# Patient Record
Sex: Female | Born: 1965 | Race: Black or African American | Hispanic: No | Smoking: Never smoker
Health system: Southern US, Community
[De-identification: ages and names within clinical notes are randomized; demographics above are authoritative.]

## PROBLEM LIST (undated history)

## (undated) ENCOUNTER — Emergency Department (HOSPITAL_COMMUNITY): Admission: EM | Payer: Self-pay | Source: Home / Self Care

## (undated) DIAGNOSIS — I10 Essential (primary) hypertension: Secondary | ICD-10-CM

## (undated) DIAGNOSIS — T7840XA Allergy, unspecified, initial encounter: Secondary | ICD-10-CM

## (undated) DIAGNOSIS — R519 Headache, unspecified: Secondary | ICD-10-CM

## (undated) DIAGNOSIS — J45909 Unspecified asthma, uncomplicated: Secondary | ICD-10-CM

## (undated) DIAGNOSIS — Z8489 Family history of other specified conditions: Secondary | ICD-10-CM

## (undated) DIAGNOSIS — K5900 Constipation, unspecified: Secondary | ICD-10-CM

## (undated) DIAGNOSIS — G459 Transient cerebral ischemic attack, unspecified: Secondary | ICD-10-CM

## (undated) DIAGNOSIS — F32A Depression, unspecified: Secondary | ICD-10-CM

## (undated) DIAGNOSIS — F329 Major depressive disorder, single episode, unspecified: Secondary | ICD-10-CM

## (undated) DIAGNOSIS — K219 Gastro-esophageal reflux disease without esophagitis: Secondary | ICD-10-CM

## (undated) HISTORY — DX: Constipation, unspecified: K59.00

## (undated) HISTORY — DX: Essential (primary) hypertension: I10

## (undated) HISTORY — PX: ABDOMINAL HYSTERECTOMY: SHX81

## (undated) HISTORY — DX: Transient cerebral ischemic attack, unspecified: G45.9

## (undated) HISTORY — DX: Allergy, unspecified, initial encounter: T78.40XA

## (undated) HISTORY — DX: Depression, unspecified: F32.A

## (undated) HISTORY — DX: Major depressive disorder, single episode, unspecified: F32.9

## (undated) HISTORY — PX: FOOT SURGERY: SHX648

---

## 1998-03-18 ENCOUNTER — Emergency Department (HOSPITAL_COMMUNITY): Admission: EM | Admit: 1998-03-18 | Discharge: 1998-03-18 | Payer: Self-pay | Admitting: Emergency Medicine

## 1998-10-16 ENCOUNTER — Encounter: Admission: RE | Admit: 1998-10-16 | Discharge: 1999-01-14 | Payer: Self-pay | Admitting: Geriatric Medicine

## 1999-03-28 ENCOUNTER — Encounter: Payer: Self-pay | Admitting: *Deleted

## 1999-03-28 ENCOUNTER — Ambulatory Visit (HOSPITAL_COMMUNITY): Admission: RE | Admit: 1999-03-28 | Discharge: 1999-03-28 | Payer: Self-pay | Admitting: *Deleted

## 1999-06-27 ENCOUNTER — Inpatient Hospital Stay: Admission: AD | Admit: 1999-06-27 | Discharge: 1999-06-27 | Payer: Self-pay | Admitting: *Deleted

## 1999-08-01 ENCOUNTER — Inpatient Hospital Stay (HOSPITAL_COMMUNITY): Admission: AD | Admit: 1999-08-01 | Discharge: 1999-08-01 | Payer: Self-pay | Admitting: *Deleted

## 1999-08-24 ENCOUNTER — Inpatient Hospital Stay (HOSPITAL_COMMUNITY): Admission: AD | Admit: 1999-08-24 | Discharge: 1999-08-24 | Payer: Self-pay | Admitting: *Deleted

## 1999-09-08 ENCOUNTER — Inpatient Hospital Stay (HOSPITAL_COMMUNITY): Admission: AD | Admit: 1999-09-08 | Discharge: 1999-09-08 | Payer: Self-pay | Admitting: *Deleted

## 1999-09-12 ENCOUNTER — Inpatient Hospital Stay (HOSPITAL_COMMUNITY): Admission: AD | Admit: 1999-09-12 | Discharge: 1999-09-12 | Payer: Self-pay | Admitting: *Deleted

## 1999-09-20 ENCOUNTER — Inpatient Hospital Stay (HOSPITAL_COMMUNITY): Admission: AD | Admit: 1999-09-20 | Discharge: 1999-09-21 | Payer: Self-pay | Admitting: *Deleted

## 2000-01-09 ENCOUNTER — Ambulatory Visit (HOSPITAL_COMMUNITY): Admission: RE | Admit: 2000-01-09 | Discharge: 2000-01-09 | Payer: Self-pay | Admitting: Orthopedic Surgery

## 2000-01-09 ENCOUNTER — Encounter: Payer: Self-pay | Admitting: Orthopedic Surgery

## 2000-02-13 ENCOUNTER — Ambulatory Visit (HOSPITAL_COMMUNITY): Admission: RE | Admit: 2000-02-13 | Discharge: 2000-02-13 | Payer: Self-pay | Admitting: *Deleted

## 2000-10-24 ENCOUNTER — Emergency Department (HOSPITAL_COMMUNITY): Admission: EM | Admit: 2000-10-24 | Discharge: 2000-10-24 | Payer: Self-pay | Admitting: Emergency Medicine

## 2000-11-12 ENCOUNTER — Encounter: Admission: RE | Admit: 2000-11-12 | Discharge: 2001-02-10 | Payer: Self-pay | Admitting: Internal Medicine

## 2001-06-24 ENCOUNTER — Other Ambulatory Visit: Admission: RE | Admit: 2001-06-24 | Discharge: 2001-06-24 | Payer: Self-pay | Admitting: Obstetrics and Gynecology

## 2001-08-30 ENCOUNTER — Encounter: Admission: RE | Admit: 2001-08-30 | Discharge: 2001-11-28 | Payer: Self-pay | Admitting: Internal Medicine

## 2001-11-28 ENCOUNTER — Encounter: Admission: RE | Admit: 2001-11-28 | Discharge: 2002-02-26 | Payer: Self-pay | Admitting: Internal Medicine

## 2002-03-14 ENCOUNTER — Ambulatory Visit (HOSPITAL_COMMUNITY): Admission: RE | Admit: 2002-03-14 | Discharge: 2002-03-14 | Payer: Self-pay | Admitting: Obstetrics and Gynecology

## 2002-03-14 ENCOUNTER — Encounter (INDEPENDENT_AMBULATORY_CARE_PROVIDER_SITE_OTHER): Payer: Self-pay | Admitting: Specialist

## 2002-03-14 ENCOUNTER — Encounter (INDEPENDENT_AMBULATORY_CARE_PROVIDER_SITE_OTHER): Payer: Self-pay

## 2002-08-24 HISTORY — PX: TOTAL ABDOMINAL HYSTERECTOMY W/ BILATERAL SALPINGOOPHORECTOMY: SHX83

## 2003-01-17 ENCOUNTER — Other Ambulatory Visit: Admission: RE | Admit: 2003-01-17 | Discharge: 2003-01-17 | Payer: Self-pay | Admitting: Obstetrics and Gynecology

## 2003-02-02 ENCOUNTER — Encounter: Admission: RE | Admit: 2003-02-02 | Discharge: 2003-02-02 | Payer: Self-pay | Admitting: Obstetrics and Gynecology

## 2003-02-02 ENCOUNTER — Encounter: Payer: Self-pay | Admitting: Obstetrics and Gynecology

## 2003-02-09 ENCOUNTER — Encounter (INDEPENDENT_AMBULATORY_CARE_PROVIDER_SITE_OTHER): Payer: Self-pay | Admitting: Specialist

## 2003-02-09 ENCOUNTER — Ambulatory Visit (HOSPITAL_COMMUNITY): Admission: RE | Admit: 2003-02-09 | Discharge: 2003-02-09 | Payer: Self-pay | Admitting: Obstetrics and Gynecology

## 2003-05-24 ENCOUNTER — Encounter (INDEPENDENT_AMBULATORY_CARE_PROVIDER_SITE_OTHER): Payer: Self-pay

## 2003-05-24 ENCOUNTER — Observation Stay (HOSPITAL_COMMUNITY): Admission: RE | Admit: 2003-05-24 | Discharge: 2003-05-25 | Payer: Self-pay | Admitting: Obstetrics and Gynecology

## 2003-05-29 ENCOUNTER — Inpatient Hospital Stay (HOSPITAL_COMMUNITY): Admission: AD | Admit: 2003-05-29 | Discharge: 2003-05-29 | Payer: Self-pay | Admitting: *Deleted

## 2003-11-15 ENCOUNTER — Encounter (INDEPENDENT_AMBULATORY_CARE_PROVIDER_SITE_OTHER): Payer: Self-pay | Admitting: Specialist

## 2003-11-15 ENCOUNTER — Ambulatory Visit (HOSPITAL_COMMUNITY): Admission: RE | Admit: 2003-11-15 | Discharge: 2003-11-15 | Payer: Self-pay | Admitting: Obstetrics and Gynecology

## 2004-01-04 ENCOUNTER — Ambulatory Visit (HOSPITAL_COMMUNITY): Admission: RE | Admit: 2004-01-04 | Discharge: 2004-01-04 | Payer: Self-pay | Admitting: Urology

## 2004-01-04 ENCOUNTER — Ambulatory Visit (HOSPITAL_BASED_OUTPATIENT_CLINIC_OR_DEPARTMENT_OTHER): Admission: RE | Admit: 2004-01-04 | Discharge: 2004-01-04 | Payer: Self-pay | Admitting: Urology

## 2004-03-04 ENCOUNTER — Emergency Department (HOSPITAL_COMMUNITY): Admission: EM | Admit: 2004-03-04 | Discharge: 2004-03-04 | Payer: Self-pay | Admitting: Emergency Medicine

## 2004-10-03 ENCOUNTER — Emergency Department (HOSPITAL_COMMUNITY): Admission: EM | Admit: 2004-10-03 | Discharge: 2004-10-03 | Payer: Self-pay | Admitting: Family Medicine

## 2004-12-11 ENCOUNTER — Emergency Department (HOSPITAL_COMMUNITY): Admission: EM | Admit: 2004-12-11 | Discharge: 2004-12-11 | Payer: Self-pay | Admitting: Family Medicine

## 2004-12-21 ENCOUNTER — Emergency Department (HOSPITAL_COMMUNITY): Admission: EM | Admit: 2004-12-21 | Discharge: 2004-12-21 | Payer: Self-pay | Admitting: Emergency Medicine

## 2004-12-31 ENCOUNTER — Encounter: Admission: RE | Admit: 2004-12-31 | Discharge: 2004-12-31 | Payer: Self-pay | Admitting: Geriatric Medicine

## 2005-01-03 ENCOUNTER — Encounter: Admission: RE | Admit: 2005-01-03 | Discharge: 2005-01-03 | Payer: Self-pay | Admitting: Internal Medicine

## 2005-11-17 ENCOUNTER — Observation Stay (HOSPITAL_COMMUNITY): Admission: EM | Admit: 2005-11-17 | Discharge: 2005-11-18 | Payer: Self-pay | Admitting: Emergency Medicine

## 2006-02-06 ENCOUNTER — Emergency Department (HOSPITAL_COMMUNITY): Admission: EM | Admit: 2006-02-06 | Discharge: 2006-02-07 | Payer: Self-pay | Admitting: *Deleted

## 2006-04-12 ENCOUNTER — Encounter: Admission: RE | Admit: 2006-04-12 | Discharge: 2006-04-12 | Payer: Self-pay | Admitting: Internal Medicine

## 2006-04-22 ENCOUNTER — Encounter: Admission: RE | Admit: 2006-04-22 | Discharge: 2006-04-22 | Payer: Self-pay | Admitting: Gastroenterology

## 2006-07-27 ENCOUNTER — Encounter: Admission: RE | Admit: 2006-07-27 | Discharge: 2006-07-27 | Payer: Self-pay | Admitting: Sports Medicine

## 2007-03-17 ENCOUNTER — Encounter: Payer: Self-pay | Admitting: Family Medicine

## 2009-01-26 ENCOUNTER — Emergency Department (HOSPITAL_COMMUNITY): Admission: EM | Admit: 2009-01-26 | Discharge: 2009-01-26 | Payer: Self-pay | Admitting: Emergency Medicine

## 2009-11-20 ENCOUNTER — Encounter: Payer: Self-pay | Admitting: *Deleted

## 2009-11-20 ENCOUNTER — Ambulatory Visit: Payer: Self-pay | Admitting: Family Medicine

## 2009-11-20 DIAGNOSIS — F341 Dysthymic disorder: Secondary | ICD-10-CM | POA: Insufficient documentation

## 2009-11-20 DIAGNOSIS — I10 Essential (primary) hypertension: Secondary | ICD-10-CM | POA: Insufficient documentation

## 2009-11-20 DIAGNOSIS — E669 Obesity, unspecified: Secondary | ICD-10-CM | POA: Insufficient documentation

## 2009-11-20 DIAGNOSIS — E119 Type 2 diabetes mellitus without complications: Secondary | ICD-10-CM | POA: Insufficient documentation

## 2009-11-20 DIAGNOSIS — M549 Dorsalgia, unspecified: Secondary | ICD-10-CM | POA: Insufficient documentation

## 2009-11-22 ENCOUNTER — Encounter: Payer: Self-pay | Admitting: Family Medicine

## 2009-11-26 ENCOUNTER — Telehealth: Payer: Self-pay | Admitting: Family Medicine

## 2009-12-03 ENCOUNTER — Ambulatory Visit (HOSPITAL_COMMUNITY): Admission: RE | Admit: 2009-12-03 | Discharge: 2009-12-03 | Payer: Self-pay | Admitting: Family Medicine

## 2009-12-03 ENCOUNTER — Encounter: Payer: Self-pay | Admitting: Family Medicine

## 2009-12-09 ENCOUNTER — Ambulatory Visit: Payer: Self-pay | Admitting: Family Medicine

## 2009-12-09 ENCOUNTER — Encounter: Payer: Self-pay | Admitting: Family Medicine

## 2009-12-09 LAB — CONVERTED CEMR LAB
ALT: 17 units/L (ref 0–35)
AST: 16 units/L (ref 0–37)
Albumin: 4.3 g/dL (ref 3.5–5.2)
Alkaline Phosphatase: 98 units/L (ref 39–117)
BUN: 16 mg/dL (ref 6–23)
Calcium: 9.6 mg/dL (ref 8.4–10.5)
Cholesterol: 177 mg/dL (ref 0–200)
Glucose, Bld: 105 mg/dL — ABNORMAL HIGH (ref 70–99)
Sodium: 140 meq/L (ref 135–145)
Total Bilirubin: 0.3 mg/dL (ref 0.3–1.2)
Total Protein: 7.1 g/dL (ref 6.0–8.3)
Triglycerides: 155 mg/dL — ABNORMAL HIGH (ref <150)

## 2009-12-11 ENCOUNTER — Encounter: Payer: Self-pay | Admitting: Family Medicine

## 2009-12-12 ENCOUNTER — Encounter: Payer: Self-pay | Admitting: Family Medicine

## 2009-12-20 ENCOUNTER — Encounter: Payer: Self-pay | Admitting: Family Medicine

## 2009-12-21 ENCOUNTER — Encounter: Payer: Self-pay | Admitting: Emergency Medicine

## 2009-12-22 ENCOUNTER — Encounter: Payer: Self-pay | Admitting: Family Medicine

## 2009-12-22 ENCOUNTER — Ambulatory Visit: Payer: Self-pay | Admitting: Family Medicine

## 2009-12-22 ENCOUNTER — Observation Stay (HOSPITAL_COMMUNITY): Admission: EM | Admit: 2009-12-22 | Discharge: 2009-12-23 | Payer: Self-pay | Admitting: Family Medicine

## 2009-12-22 DIAGNOSIS — R55 Syncope and collapse: Secondary | ICD-10-CM | POA: Insufficient documentation

## 2009-12-22 DIAGNOSIS — R42 Dizziness and giddiness: Secondary | ICD-10-CM | POA: Insufficient documentation

## 2009-12-27 ENCOUNTER — Ambulatory Visit: Payer: Self-pay | Admitting: Family Medicine

## 2010-01-02 ENCOUNTER — Telehealth (INDEPENDENT_AMBULATORY_CARE_PROVIDER_SITE_OTHER): Payer: Self-pay | Admitting: *Deleted

## 2010-01-02 DIAGNOSIS — E559 Vitamin D deficiency, unspecified: Secondary | ICD-10-CM | POA: Insufficient documentation

## 2010-01-08 ENCOUNTER — Ambulatory Visit: Payer: Self-pay | Admitting: Family Medicine

## 2010-02-05 ENCOUNTER — Encounter: Payer: Self-pay | Admitting: *Deleted

## 2010-07-07 ENCOUNTER — Ambulatory Visit: Payer: Self-pay | Admitting: Family Medicine

## 2010-07-07 ENCOUNTER — Encounter: Payer: Self-pay | Admitting: Sports Medicine

## 2010-07-07 ENCOUNTER — Encounter: Payer: Self-pay | Admitting: Family Medicine

## 2010-07-07 ENCOUNTER — Telehealth (INDEPENDENT_AMBULATORY_CARE_PROVIDER_SITE_OTHER): Payer: Self-pay | Admitting: *Deleted

## 2010-09-23 NOTE — Miscellaneous (Signed)
Summary: Eye Exam  Clinical Lists Changes  Observations: Added new observation of PAST MED HX: Diabetes (2006) HTN Depression Anxiety Xray 11/2007 Rt hip: osteoarthritic changes, spurring along superior lateral aspect of acetabulum Eye exam 11/2009: No diabetic retinopathy, by Wynona Luna, OD. [phone] (12/20/2009 11:00) Added new observation of DMEYEEXAMNXT: 12/2010 (12/20/2009 11:00) Added new observation of DIAB EYE EX: No diabetic retinopathy.   Visual acuity OD (best corrected):     20/20 Visual acuity OS (best corrected):     20/25 Intraocular pressure OD:     20 Intraocular pressure OS:     18  By Wynona Luna, OD.  [phone]  (12/11/2009 11:03)      Diabetic Eye Exam  Procedure date:  12/11/2009  Findings:      No diabetic retinopathy.   Visual acuity OD (best corrected):     20/20 Visual acuity OS (best corrected):     20/25 Intraocular pressure OD:     20 Intraocular pressure OS:     18  By Wynona Luna, OD.  [phone]   Procedures Next Due Date:    Diabetic Eye Exam: 12/2010   Past History:  Past Medical History: Diabetes (2006) HTN Depression Anxiety Xray 11/2007 Rt hip: osteoarthritic changes, spurring along superior lateral aspect of acetabulum Eye exam 11/2009: No diabetic retinopathy, by Wynona Luna, OD. [phone]

## 2010-09-23 NOTE — Miscellaneous (Signed)
Summary: Records from [facility]  Clinical Lists Changes Meds:   Protonix 40mg  daily Diovan 80mg  daily Byeta 5mg  two times a day Lexapro 10mg  daily Ibuprofen 800mg  three times a day Glucophage 500mg  two times a day Lorazepam 0.5mg  two times a day Albuterol as needed  On 06/05/09 pt was referred to Psychiatry at [facility]: Dx Situational reaction to breaking-and-entering  03/15/2007: Pt was seen by Dr Helmut Muster  (Rhe[place]) for body pain.  DDx at that time were Connective tissue disease, Infection (parvovirus or hepatitis), Spondylarthropathy, Isolated  elevated CPK, Arthritis pain.  He recommended Tylenol / Ibuprofen, Oxycodone    Observations: Added new observation of PAST MED HX: Diabetes (2006) HTN Depression Anxiety Xray 11/2007 Rt hip: osteoarthritic changes, spurring along superior lateral aspect of acetabulum (12/03/2009 13:15) Added new observation of PAST SURG HX: -Foot surgery x 3, 2008 Dr Lajoyce Corners [facility] Ortho): excision of the os peronei right foot with tenodesis of the peroneus bevis and peroneus longus -12/08/2005  Stress Test at [facility] Card:  NO ischemia.  Normal L ventricular contraction with ED 68%.   -TAH (fibroids) 2004  (12/03/2009 13:15) Added new observation of PRIMARY MD: Mileena Rothenberger MD (12/03/2009 13:15) Added new observation of BONE DENSITY: Done at [facility] (03/17/2007 8:43) Added new observation of BONE DENSITY: Lumbar Spine:  T Score > -1.0 Spine.  Hip Total: T Score > -1.0 Hip.   (03/17/2007 8:42)     Primary lesion:  PCP:   Past History:  Past Medical History: Diabetes (2006) HTN Depression Anxiety Xray 11/2007 Rt hip: osteoarthritic changes, spurring along superior lateral aspect of acetabulum  Past Surgical History: -Foot surgery x 3, 2008 Dr Lajoyce Corners [facility] Ortho): excision of the os peronei right foot with tenodesis of the peroneus bevis and peroneus longus -12/08/2005  Stress Test at [facility] Card:   NO ischemia.  Normal L ventricular contraction with ED 68%.   -TAH (fibroids) 2004   Bone Density  Procedure date:  03/17/2007  Findings:      Lumbar Spine:  T Score > -1.0 Spine.  Hip Total: T Score > -1.0 Hip.    Bone Density  Procedure date:  03/17/2007  Findings:      Done at [facility]

## 2010-09-23 NOTE — Progress Notes (Signed)
Summary: Rx Prob  Phone Note Call from Patient Call back at [facility] Phone [phone]   Caller: Patient Summary of Call: When she went to pick up her rx for her Vitman D & her Prodigy Meter she could not get it due to the pharmacy not having the right dx code to file for medicaid.  Pharmacy is [facility] Initial call taken by: Clydell Hakim,  Jan 02, 2010 10:28 AM  New Problems: VITAMIN D DEFICIENCY (ICD-268.9)   New Problems: VITAMIN D DEFICIENCY (ICD-268.9)  patient notified that diagnosis codes have been faxed to pharmacy. Theresia Lo RN  Jan 02, 2010 11:42 AM

## 2010-09-23 NOTE — Miscellaneous (Signed)
Summary: NPI for eye exam  Clinical Lists Changes gave our NPI for Dr. Kennon Rounds [phone] do a diabetic eye exam..Kiora Hallberg [facility] RN  February 05, 2010 11:04 AM

## 2010-09-23 NOTE — Miscellaneous (Signed)
Summary: ROI/TS  Clinical Lists Changes ROI FAXED TO DR. POLITE ([place]).Arlyss Repress CMA,  November 20, 2009 4:21 PM

## 2010-09-23 NOTE — Letter (Signed)
Summary: Handout Printed  Printed Handout:  - Educational psychologist

## 2010-09-23 NOTE — Miscellaneous (Signed)
Summary: Procedures Consent  Procedures Consent   Imported By: De Nurse 07/11/2010 16:53:15  _____________________________________________________________________  External Attachment:    Type:   Image     Comment:   External Document

## 2010-09-23 NOTE — Initial Assessments (Signed)
Vital Signs:  Patient profile:   45 year old female O2 Sat:      96 % on Room air Temp:     99.4 degrees F Pulse rate:   88 / minute Pulse rhythm:   regular Resp:     18 per minute BP supine:   114 / 77  O2 Flow:  Room air  Primary Care Provider:  Cat Ta MD  CC:  Dizziness and passing out.  History of Present Illness: HPI: Pt is a 45 yo F who presented to [facility] with dizziness and two episodes of syncope.  Pt with a PMHx significant for depression and hypertension.  She was recently started on Lisinopril about 4 weeks ago.  For 2 weeks now she has been dizzy.  The dizziness is worse when she gets up.  She fell twice last week when she stood up and "everything went black".  She was driving today and turned suddenly and got very dizzy.  She was at the pharacy today after that happened and her blood pressure was 60/30 so she came to the ED.  ROS: Endorses feeling like her throat is going to close up.  Denies headache, numbness / weakness, chest pain, shortness of breath, headache, vision changes, dysuria, excessive thirst   Current Problems (verified): 1)  Back Pain  (ICD-724.5) 2)  Obesity  (ICD-278.00) 3)  Anxiety Depression  (ICD-300.4) 4)  Hypertension, Essential  (ICD-401.9) 5)  Dm  (ICD-250.00)  Current Medications (verified): 1)  Glucophage 500 Mg Tabs (Metformin Hcl) .Marland Kitchen.. 1 Tab By Mouth Two Times A Day For Diabetes 2)  Fluoxetine Hcl 10 Mg Caps (Fluoxetine Hcl) .Marland Kitchen.. 1 Capsule By Mouth Daily 3)  Ibuprofen 800 Mg Tabs (Ibuprofen) .Marland Kitchen.. 1 Tab By Mouth Every 8 Hours As Needed Back Pain and Foot Pain 4)  Lisinopril-Hydrochlorothiazide 20-12.5 Mg Tabs (Lisinopril-Hydrochlorothiazide) .Marland Kitchen.. 1 Tab By Mouth Daily For Blood Pressure 5)  Byetta 5 Mcg Pen 5 Mcg/0.22ml Soln (Exenatide) .... Inject Subcutaneously Two Times A Day For Diabetes. Dispense For 1 Month 6)  Bd Pen Needle Short U/f 31g X 8 Mm Misc (Insulin Pen Needle) .... Dispense Quanity Sufficient For Twice A Day Injections 7)   Vitamin D (Ergocalciferol) 50000 Unit Caps (Ergocalciferol) .Marland Kitchen.. 1 Cap By Mouth Each Week For 8 Weeks.  Allergies (verified): No Known Drug Allergies  Past History:  Past Medical History: Reviewed history from 12/20/2009 and no changes required. Diabetes (2006) HTN Depression Anxiety Xray 11/2007 Rt hip: osteoarthritic changes, spurring along superior lateral aspect of acetabulum Eye exam 11/2009: No diabetic retinopathy, by Wynona Luna, OD. [phone]  Past Surgical History: Reviewed history from 12/03/2009 and no changes required. -Foot surgery x 3, 2008 Dr Lajoyce Corners [facility] Ortho): excision of the os peronei right foot with tenodesis of the peroneus bevis and peroneus longus -12/08/2005  Stress Test at [facility] Card:  NO ischemia.  Normal L ventricular contraction with ED 68%.   -TAH (fibroids) 2004  Family History: Reviewed history from 11/20/2009 and no changes required. Father: unknown Mother: age 69, HTN, DM No CAD, No Cancer  Social History: Reviewed history from 11/20/2009 and no changes required. 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  Quit all ethol 2008.  Never Tobacco Never Drugs  Physical Exam  General:  Awake, alert, no acute distress Head:  normocephalic and  atraumatic.   Eyes:  PERRL, EOMI Neck:  no JVD, no carotid bruits Lungs:  Normal respiratory effort, chest expands symmetrically. Lungs are clear to auscultation, no crackles or wheezes. Heart:  normal rate, regular rhythm, and no murmur.   Abdomen:  soft, non-tender, normal bowel sounds, and no distention.   Extremities:  no lower extremity edema Neurologic:  alert & oriented X3, cranial nerves II-XII intact, strength normal in all extremities, and sensation intact to light touch.   Skin:  turgor normal and color normal.   Psych:  Oriented X3,  memory intact for recent and remote, and normally interactive.   Additional Exam:  1. Head CT: normal 2. CXR: normal 3. CBG: 106 4. CBC: 10.4>12.4<409 5. INR 0.97 6. UA: neg nitrite, trace LE 7. UMicro: few epi, 3-6 WBCs, few bacteria 8. POC CEs neg 9. CEs neg x 1    Impression & Recommendations:  Problem # 1:  SYNCOPE (ICD-780.2) Assessment New This is most likely due to hypotension since her blood pressure was low at the pharmacy and in the ED.  Will check orthostatics.  Do not think that this is caused by something serious.  Head CT negative for stroke.  Will rule out MI with CEs.  Will monitor on telemetry for arrhythmia.  Will check TSH.    Problem # 2:  DIZZINESS (ICD-780.4) Assessment: New Most likely 2/2 hypotension.  Will hold Lisinopril.    Problem # 3:  ANXIETY DEPRESSION (ICD-300.4) Assessment: Unchanged Hold Prozac for now  Problem # 4:  DM (ICD-250.00) SSI Her updated medication list for this problem includes:    Glucophage 500 Mg Tabs (Metformin hcl) .Marland Kitchen... 1 tab by mouth two times a day for diabetes    Lisinopril-hydrochlorothiazide 20-12.5 Mg Tabs (Lisinopril-hydrochlorothiazide) .Marland Kitchen... 1 tab by mouth daily for blood pressure    Byetta 5 Mcg Pen 5 Mcg/0.65ml Soln (Exenatide) ..... Inject subcutaneously two times a day for diabetes. dispense for 1 month FEN/GI: Heart Healthy Carb-modified diet.  NS @ 150cc/hr  PPx: Heparin  Dispo: Observation status, pending negative workup  Complete Medication List: 1)  Glucophage 500 Mg Tabs (Metformin hcl) .Marland Kitchen.. 1 tab by mouth two times a day for diabetes 2)  Fluoxetine Hcl 10 Mg Caps (Fluoxetine hcl) .Marland Kitchen.. 1 capsule by mouth daily 3)  Ibuprofen 800 Mg Tabs (Ibuprofen) .Marland Kitchen.. 1 tab by mouth every 8 hours as needed back pain and foot pain 4)  Lisinopril-hydrochlorothiazide 20-12.5 Mg Tabs (Lisinopril-hydrochlorothiazide) .Marland Kitchen.. 1 tab by mouth daily for blood pressure 5)  Byetta 5 Mcg Pen 5 Mcg/0.57ml Soln (Exenatide) .... Inject  subcutaneously two times a day for diabetes. dispense for 1 month 6)  Bd Pen Needle Short U/f 31g X 8 Mm Misc (Insulin pen needle) .... Dispense quanity sufficient for twice a day injections 7)  Vitamin D (ergocalciferol) 50000 Unit Caps (Ergocalciferol) .Marland Kitchen.. 1 cap by mouth each week for 8 weeks.

## 2010-09-23 NOTE — Miscellaneous (Signed)
Summary: Pt's list of home meds  Clinical Lists Changes Pt dropped off list that showed she was taking Alprazolam 0.5mg   Fluoxetine 10mg  daily Lorazepam 0.5mg  Zoldipem 10mg  Byetta 5mg   I will Rx byetta, fluoxetine, ibuprofen but not the rest.  She has been off of meds for quite a few months and I did not discuss other meds with her.  Will need to see me in clinic for other meds.  Medications: Changed medication from FLUOXETINE HCL 40 MG CAPS (FLUOXETINE HCL) 1 tab by mouth daily to FLUOXETINE HCL 10 MG CAPS (FLUOXETINE HCL) 1 capsule by mouth daily - Signed Added new medication of BYETTA 5 MCG PEN 5 MCG/0.02ML SOLN (EXENATIDE) Inject subcutaneously two times a day for diabetes. Dispense for 1 month - Signed Rx of FLUOXETINE HCL 10 MG CAPS (FLUOXETINE HCL) 1 capsule by mouth daily;  #30 x 6;  Signed;  Entered by: Angeline Slim MD;  Authorized by: Angeline Slim MD;  Method used: Electronically to [facility] [address], [place], [place], [place]  [address], Ph: [phone], Fax: [phone] Rx of BYETTA 5 MCG PEN 5 MCG/0.02ML SOLN (EXENATIDE) Inject subcutaneously two times a day for diabetes. Dispense for 1 month;  #1 x 6;  Signed;  Entered by: Angeline Slim MD;  Authorized by: Angeline Slim MD;  Method used: Electronically to [facility] [address], [place], [place], [place]  [address], Ph: [phone], Fax: [phone]    Prescriptions: BYETTA 5 MCG PEN 5 MCG/0.02ML SOLN (EXENATIDE) Inject subcutaneously two times a day for diabetes. Dispense for 1 month  #1 x 6   Entered and Authorized by:   Angeline Slim MD   Signed by:   Angeline Slim MD on 11/22/2009   Method used:   Electronically to        [facility] (retail)       [address]       [place], [place]  [address]       Ph: [phone]       Fax: [phone]   RxID:   [id number] FLUOXETINE HCL 10 MG CAPS (FLUOXETINE HCL) 1 capsule by mouth daily  #30 x 6   Entered and Authorized by:   Angeline Slim  MD   Signed by:   Angeline Slim MD on 11/22/2009   Method used:   Electronically to        [facility] (retail)       [address]       [place], [place]  [address]       Ph: [phone]       Fax: [phone]   RxID:   [id number]

## 2010-09-23 NOTE — Progress Notes (Signed)
Summary: pt threw her back out would like to be seen   Phone Note Call from Patient Call back at [phone]   Caller: Patient Call For: traige Summary of Call: lifting some boxes her back was thrown out would like to be seen today. she is in a lot of pain Initial call taken by: Loralee Pacas CMA,  July 07, 2010 9:50 AM  Follow-up for Phone Call        Returned call, no answer.   Follow-up by: Dennison Nancy RN,  July 07, 2010 10:05 AM  Additional Follow-up for Phone Call Additional follow up Details #1::        Still not answering.  If she calls back please schedule her with Dr. Karie Schwalbe in his last SDA spot. Additional Follow-up by: Dennison Nancy RN,  July 07, 2010 10:23 AM    Additional Follow-up for Phone Call Additional follow up Details #2::    appt sched for 11:15 Follow-up by: De Nurse,  July 07, 2010 10:29 AM

## 2010-09-23 NOTE — Consult Note (Signed)
Summary: Optometry: No diabetic Retinopathy  Ophthalmology   Imported By: Clydell Hakim 12/16/2009 15:50:33  _____________________________________________________________________  External Attachment:    Type:   Image     Comment:   External Document

## 2010-09-23 NOTE — Assessment & Plan Note (Signed)
Summary: bp ck/tcb  Nurse Visit   Vital Signs:  Patient profile:   45 year old female BP sitting:   129 / 86  (left arm)  Vitals Entered By: Loralee Pacas CMA (Jan 08, 2010 9:22 AM)  Allergies: No Known Drug Allergies  Orders Added: 1)  Est Level 1- [facility] [[address]]

## 2010-09-23 NOTE — Assessment & Plan Note (Signed)
Summary: FU/KH   Vital Signs:  Patient profile:   45 year old female Weight:      210 pounds BMI:     33.01 Temp:     98.8 degrees F oral Pulse rate:   83 / minute BP sitting:   118 / 89  (left arm)  Vitals Entered By: Arlyss Repress CMA, (Dec 27, 2009 4:49 PM) CC: hospital f/up   Primary Care Provider:  Angeline Slim MD  CC:  hospital f/up.  History of Present Illness: 45 y/o F was recently admitted for syncope most likely due to hypotension.  Antihypertensive and Byetta were discontinued at that time.  During hospitalizations CBGs were in the 100-120s range.  This is hosp f/u visit.  She has been taking Byetta and Lisinopril20/HCTZ 12.5 for years without complications.  States that she recently had a lot of stressor.  Denied taking too much meds.  Denied skipping meals.    Stressors:   1.  Pt was on her way here when the tire on her car broke.  Noone stopped to help her change it.  So she was late getting her.    2.  55 y/o son was recently expelled from [employer].  The only school he can attend now is [employer].  Patient is now responsible for taking son to new school.  She is worried about her ability to do that.  Her son's father did not offer her any assitance when she told him the news.  He did not offer to have her son live with hiim.  3.  Ex-husband remarried on 4/25 to someone he has only known for 2 months.  Pt is living in his house.  She has until the end of June to move out.  She is now waiting for [employer] to assist her with housing.     Habits & Providers  Alcohol-Tobacco-Diet     Tobacco Status: never  Current Medications (verified): 1)  Glucophage 500 Mg Tabs (Metformin Hcl) .Marland Kitchen.. 1 Tab By Mouth Two Times A Day For Diabetes 2)  Fluoxetine Hcl 10 Mg Caps (Fluoxetine Hcl) .Marland Kitchen.. 1 Capsule By Mouth Daily 3)  Ibuprofen 800 Mg Tabs (Ibuprofen) .Marland Kitchen.. 1 Tab By Mouth Every 8 Hours As Needed Back Pain and Foot Pain 4)  Lisinopril-Hydrochlorothiazide 20-12.5 Mg Tabs  (Lisinopril-Hydrochlorothiazide) .Marland Kitchen.. 1 Tab By Mouth Daily For Blood Pressure 5)  Byetta 5 Mcg Pen 5 Mcg/0.56ml Soln (Exenatide) .... Inject Subcutaneously Two Times A Day For Diabetes. Dispense For 1 Month 6)  Bd Pen Needle Short U/f 31g X 8 Mm Misc (Insulin Pen Needle) .... Dispense Quanity Sufficient For Twice A Day Injections 7)  Vitamin D (Ergocalciferol) 50000 Unit Caps (Ergocalciferol) .Marland Kitchen.. 1 Cap By Mouth Each Week For 8 Weeks. 8)  Prodigy Blood Glucose Monitor W/device Kit (Blood Glucose Monitoring Suppl) .... Check Your Sugar Three Times A Day 9)  Prodigy Blood Glucose Test  Strp (Glucose Blood) .... Test Your Gluoces Three Times A Day  Allergies (verified): No Known Drug Allergies  Past History:  Past Medical History: Last updated: 12/20/2009 Diabetes (2006) HTN Depression Anxiety Xray 11/2007 Rt hip: osteoarthritic changes, spurring along superior lateral aspect of acetabulum Eye exam 11/2009: No diabetic retinopathy, by Wynona Luna, OD. [phone]  Past Surgical History: Last updated: 12/03/2009 -Foot surgery x 3, 2008 Dr Lajoyce Corners [facility] Ortho): excision of the os peronei right foot with tenodesis of the peroneus bevis and peroneus longus -12/08/2005  Stress Test at [facility] Card:  NO  ischemia.  Normal L ventricular contraction with ED 68%.   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  Quit all ethol 2008.  Never Tobacco Never Drugs  Risk Factors: Alcohol Use: 0 (11/20/2009) Exercise: no (11/20/2009)  Risk Factors: Smoking Status: never (12/27/2009)  Social History: Lives with 2 sons  Pershing Proud 16, Darron Psychologist, occupational 11) and adopted daughter  Rica Mast 5) No pets Unemployed [employer] graduate Divorced Renting  Used to drink 3-4 cases of beer per week.  Quit all ethol 2008.  Never Tobacco Never Drugs  Review of Systems CV:  Denies chest pain or discomfort, difficulty breathing at night, difficulty breathing while lying down, fainting, fatigue, lightheadness, near fainting, and palpitations. Resp:  Denies chest discomfort, chest pain with inspiration, cough, and wheezing. Endo:  Denies cold intolerance, excessive hunger, excessive thirst, excessive urination, heat intolerance, and polyuria.  Physical Exam  General:  Well-developed,well-nourished,in no acute distress; alert,appropriate and cooperative throughout examination. vitals reviewed Lungs:  Normal respiratory effort, chest expands symmetrically. Lungs are clear to auscultation, no crackles or wheezes. Heart:  Normal rate and regular rhythm. S1 and S2 normal without gallop, murmur, click, rub or other extra sounds. Abdomen:  Bowel sounds positive,abdomen soft and non-tender without masses, organomegaly or hernias noted.   Impression & Recommendations:  Problem # 1:  SYNCOPE (ICD-780.2) Assessment Improved  No syncopal episodes since discharge.  At this time BP is at goal.  Will continue to hold off on antihypertensive for now.  Pt to return to clinic in next 1-2 wks for BP check.  She is to check CBGs three times a day and if >200 consistently she is to resume Byetta.  Pt to rtc in 4 wks to see me.    Orders: [facility]- Est Level  3 ([address])  Complete Medication List: 1)  Glucophage 500 Mg Tabs (Metformin hcl) .Marland Kitchen.. 1 tab by mouth two times a day for diabetes 2)  Fluoxetine Hcl 10 Mg Caps (Fluoxetine hcl) .Marland Kitchen.. 1 capsule by mouth daily 3)  Ibuprofen 800 Mg Tabs (Ibuprofen) .Marland Kitchen.. 1 tab by mouth every 8 hours as needed back pain and foot pain 4)  Lisinopril-hydrochlorothiazide 20-12.5 Mg Tabs (Lisinopril-hydrochlorothiazide) .Marland Kitchen.. 1 tab by mouth daily for blood  pressure 5)  Byetta 5 Mcg Pen 5 Mcg/0.7ml Soln (Exenatide) .... Inject subcutaneously two times a day for diabetes. dispense for 1 month 6)  Bd Pen Needle Short U/f 31g X 8 Mm Misc (Insulin pen needle) .... Dispense quanity sufficient for twice a day injections 7)  Vitamin D (ergocalciferol) 50000 Unit Caps (Ergocalciferol) .Marland Kitchen.. 1 cap by mouth each week for 8 weeks. 8)  Prodigy Blood Glucose Monitor W/device Kit (Blood glucose monitoring suppl) .... Check your sugar three times a day 9)  Prodigy Blood Glucose Test Strp (Glucose blood) .... Test your gluoces three times a day  Patient Instructions: 1)  Please schedule a follow-up appointment in 1-2 weeks for nurse visit to check your blood pressure x 2.  2)  Please schedule a follow-up appointment in 4 weeks with Dr Janalyn Harder 3)  Check your sugar three times per day and bring in the log in when you see me.  If  you see numbers > 200, start your Byetta again.  I don't want your numbers to get so high that you may start to feel bad again.   Prescriptions: PRODIGY BLOOD GLUCOSE TEST  STRP (GLUCOSE BLOOD) Test your gluoces three times a day  #99 x 99   Entered and Authorized by:   Angeline Slim MD   Signed by:   Angeline Slim MD on 12/27/2009   Method used:   Electronically to        [facility] (retail)       [address]       [place], [place]  [address]       Ph: [phone]       Fax: [phone]   RxID:   [id number] PRODIGY BLOOD GLUCOSE MONITOR W/DEVICE KIT (BLOOD GLUCOSE MONITORING SUPPL) Check your sugar three times a day  #1 x 0   Entered and Authorized by:   Angeline Slim MD   Signed by:   Angeline Slim MD on 12/27/2009   Method used:   Electronically to        [facility] (retail)       [address]       [place], [place]  [address]       Ph: [phone]       Fax: [phone]   RxID:   [id number]

## 2010-09-23 NOTE — Letter (Signed)
Summary: Lipid Letter, lab results  [facility]  [address]   [place], [place] [address]   Phone: [phone]  Fax: [phone]    12/11/2009  Isabel Berger [address] [name], [place]  [address]  Dear Isabel Berger:  We have carefully reviewed your last lipid profile from 12/09/2009 and the results are noted below with a summary of recommendations for lipid management.    Cholesterol:       177     Goal: < 200   HDL "good" Cholesterol:   37     Goal: > 45   LDL "bad" Cholesterol:   109     Goal: < 130   Triglycerides:       155     Goal: < 150        TLC Diet (Therapeutic Lifestyle Change): Saturated Fats & Transfatty acids should be kept < 7% of total calories ***Reduce Saturated Fats Polyunstaurated Fat can be up to 10% of total calories Monounsaturated Fat Fat can be up to 20% of total calories Total Fat should be no greater than 25-35% of total calories Carbohydrates should be 50-60% of total calories Protein should be approximately 15% of total calories Fiber should be at least 20-30 grams a day ***Increased fiber may help lower LDL Total Cholesterol should be < 200mg /day Consider adding plant stanol/sterols to diet (example: Benacol spread) ***A higher intake of unsaturated fat may reduce Triglycerides and Increase HDL    Adjunctive Measures (may lower LIPIDS and reduce risk of Heart Attack) include: Aerobic Exercise (20-30 minutes 3-4 times a week) Limit Alcohol Consumption Weight Reduction Aspirin 75-81 mg a day by mouth (if not allergic or contraindicated) Dietary Fiber 20-30 grams a day by mouth   Hemoglobin:  No anemia  Kidneys:  Normal  Electrolytes:  Normal  Liver:  Normal  Thyroid:  Normal    Vitamin D: very low.  I would like you to take Vitamin D 50,000 units once a week for 8 weeks, then take over-the-counter daily Vitamin D supplement.  I will send a prescription to your pharmacy.    Glucose (sugar): mildly elevated.   I would like you to come to clinic to check your blood for diabetes.  You can call and make an appointment with lab for this test, or you can wait until you see me in May and we can check it then.    If you have any questions, please call. We appreciate being able to work with you.   Sincerely,    [facility] Lillyona Polasek MD  Appended Document: Lipid Letter, lab results mailed.

## 2010-09-23 NOTE — Miscellaneous (Signed)
Summary: Vit D Rx  Clinical Lists Changes  Medications: Added new medication of VITAMIN D (ERGOCALCIFEROL) 50000 UNIT CAPS (ERGOCALCIFEROL) 1 cap by mouth each week for 8 weeks. - Signed Rx of VITAMIN D (ERGOCALCIFEROL) 50000 UNIT CAPS (ERGOCALCIFEROL) 1 cap by mouth each week for 8 weeks.;  #8 x 0;  Signed;  Entered by: Angeline Slim MD;  Authorized by: Angeline Slim MD;  Method used: Electronically to [facility] [address] [place], [place], [place]  [address], Ph: [phone], Fax: [phone]    Prescriptions: VITAMIN D (ERGOCALCIFEROL) 50000 UNIT CAPS (ERGOCALCIFEROL) 1 cap by mouth each week for 8 weeks.  #8 x 0   Entered and Authorized by:   Angeline Slim MD   Signed by:   Angeline Slim MD on 12/11/2009   Method used:   Electronically to        [facility] (retail)       [address]       [place], [place]  [address]       Ph: [phone]       Fax: [phone]   RxID:   [id number]

## 2010-09-23 NOTE — Progress Notes (Signed)
Summary: RX  Phone Note Refill Request Call back at Home Phone [phone]   pt sts she got the RX Byetta but not the pens, she needs the  BD ultrafine 3 short pen needles, 8 mm, [facility] to [facility]  Initial call taken by: Knox Royalty,  November 26, 2009 2:17 PM  Follow-up for Phone Call        Pt. notified Rx for needles was sent to [facility]. Follow-up by: Terese Door,  November 27, 2009 2:32 PM    New/Updated Medications: BD PEN NEEDLE SHORT U/F 31G X 8 MM MISC (INSULIN PEN NEEDLE) dispense quanity sufficient for twice a day injections Prescriptions: BD PEN NEEDLE SHORT U/F 31G X 8 MM MISC (INSULIN PEN NEEDLE) dispense quanity sufficient for twice a day injections  #1 x 11   Entered by:   Terese Door   Authorized by:   Angeline Slim MD   Signed by:   Terese Door on 11/27/2009   Method used:   Electronically to        [facility] (retail)       [address]       [place], [place]  [address]       Ph: [phone]       Fax: [phone]   RxID:   [id number]  Please verify needle size.  I do not understand the size pt left on message.  Pt should read from the box she has at home. Wassim Kirksey MD  November 26, 2009 11:13 PM  Spoke to pt Re referral to Dr Lajoyce Corners.  We are waiting for records from previous PCP because when I saw pt last she was unsure as to why she needs to see Dr Lajoyce Corners.  Pt understands.  Heavyn Yearsley MD  November 27, 2009 2:38 PM
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
## 2010-09-23 NOTE — Assessment & Plan Note (Signed)
Summary: back pain,df   Vital Signs:  Patient profile:   45 year old female Height:      67 inches Weight:      229 pounds BMI:     36.00 Temp:     98.2 degrees F oral Pulse rate:   90 / minute BP sitting:   125 / 83  (left arm) Cuff size:   large  Vitals Entered By: Tessie Fass CMA (July 07, 2010 11:22 AM) CC: lower back pain x 1 week Is Patient Diabetic? Yes Pain Assessment Patient in pain? yes     Location: lower back Intensity: 10   Primary Care Provider:  Cat Ta MD  CC:  lower back pain x 1 week.  History of Present Illness: 45 yo female with hx chronic LBP here with acute exacerbation.  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  Over the next few days she began having severe LBP mostly on the right side just above buttock without radiation.  Pain is sharp, worse with moving particularly with spine extension.  No bowel/bladder problems, no saddle anesthesia.  She has been taking approx 1600 mg of ibuprofen total per day.  This helps a little.  Pain is stable.  Pain is 10/10.  Habits & Providers  Alcohol-Tobacco-Diet     Tobacco Status: never  Current Medications (verified): 1)  Glucophage 500 Mg Tabs (Metformin Hcl) .Marland Kitchen.. 1 Tab By Mouth Two Times A Day For Diabetes 2)  Fluoxetine Hcl 10 Mg Caps (Fluoxetine Hcl) .Marland Kitchen.. 1 Capsule By Mouth Daily 3)  Ibuprofen 800 Mg Tabs (Ibuprofen) .Marland Kitchen.. 1 Tab By Mouth Every 8 Hours As Needed Back Pain and Foot Pain 4)  Lisinopril-Hydrochlorothiazide 20-12.5 Mg Tabs (Lisinopril-Hydrochlorothiazide) .Marland Kitchen.. 1 Tab By Mouth Daily For Blood Pressure 5)  Byetta 5 Mcg Pen 5 Mcg/0.57ml Soln (Exenatide) .... Inject Subcutaneously Two Times A Day For Diabetes. Dispense For 1 Month 6)  Bd Pen Needle Short U/f 31g X 8 Mm Misc (Insulin Pen Needle) .... Dispense Quanity Sufficient For Twice A Day Injections 7)  Vitamin D (Ergocalciferol) 50000 Unit Caps (Ergocalciferol) .Marland Kitchen.. 1 Cap By Mouth Each Week For 8 Weeks. 8)  Prodigy  Blood Glucose Monitor W/device Kit (Blood Glucose Monitoring Suppl) .... Check Your Sugar Three Times A Day 9)  Prodigy Blood Glucose Test  Strp (Glucose Blood) .... Test Your Gluoces Three Times A Day  Allergies (verified): No Known Drug Allergies  Review of Systems       See PI  Physical Exam  General:  Well-developed,well-nourished,in no acute distress; alert,appropriate and cooperative throughout examination Msk:  Back unremarkable to inspetion, TTP localized over R PSIS region.  ROM full to side bending, rotation, flexion to 50 deg and extension to about 10 deg limited by pain.    Straight leg raise reproduces back pain but does not cause radiation of pain down legs.  Strength in LE 5/5 to hip flexion, ext, knee flexion, ext, ankle dorsi/plantarflexion, inversion and eversion. DTRs. 1+ thoughout but symmetric.  Sensation grossly unermarkable in b/l LE. gait unremarkable.  Additional Exam:  Trigger point injection Consent obtained and verified. Sterile betadine prep. Furthur cleansed with alcohol. Topical analgesic spray: Ethyl chloride. Joint:R low back trigger point near PSIS Needle advanced to PSIS into area of maximal tenderness. Completed without difficulty Meds:1/2 cc kenalog 40, 1 cc lidocaine 1% Needle: 25g. Aftercare instructions and Red flags advised. Immediate relief of pain.   Impression & Recommendations:  Problem # 1:  LUMBAR STRAIN, ACUTE (ICD-847.2) Assessment New Toradol 30mg  IM given. Injected trigger point with immediate improvement in pain. Sports med advisor rehab exercises. Cont ibuprofen 600 three times a day. RTC if no better in 2 weeks.  Orders: [facility]- Est  Level 4 ([address]) Trigger point injection- [facility] ([address])  Complete Medication List: 1)  Glucophage 500 Mg Tabs (Metformin hcl) .Marland Kitchen.. 1 tab by mouth two times a day for diabetes 2)  Fluoxetine Hcl 10 Mg Caps (Fluoxetine hcl) .Marland Kitchen.. 1 capsule by mouth daily 3)  Ibuprofen 800 Mg Tabs (Ibuprofen) .Marland Kitchen..  1 tab by mouth every 8 hours as needed back pain and foot pain 4)  Lisinopril-hydrochlorothiazide 20-12.5 Mg Tabs (Lisinopril-hydrochlorothiazide) .Marland Kitchen.. 1 tab by mouth daily for blood pressure 5)  Byetta 5 Mcg Pen 5 Mcg/0.3ml Soln (Exenatide) .... Inject subcutaneously two times a day for diabetes. dispense for 1 month 6)  Bd Pen Needle Short U/f 31g X 8 Mm Misc (Insulin pen needle) .... Dispense quanity sufficient for twice a day injections 7)  Vitamin D (ergocalciferol) 50000 Unit Caps (Ergocalciferol) .Marland Kitchen.. 1 cap by mouth each week for 8 weeks. 8)  Prodigy Blood Glucose Monitor W/device Kit (Blood glucose monitoring suppl) .... Check your sugar three times a day 9)  Prodigy Blood Glucose Test Strp (Glucose blood) .... Test your gluoces three times a day  Other Orders: Ketorolac-Toradol 15mg  (U9811)   Medication Administration  Injection # 1:    Medication: Ketorolac-Toradol 15mg     Diagnosis: BACK PAIN (ICD-724.5)    Route: IM    Site: R deltoid    Exp Date: 01/23/2012    Lot #: [id number]    Mfr: Hospira    Comments: 30mg /27ml given     Patient tolerated injection without complications    Given by: Tessie Fass CMA (July 07, 2010 12:15 PM)  Orders Added: 1)  [facility]- Est  Level 4 [56213] 2)  Trigger point injection- [facility] [20553] 3)  Ketorolac-Toradol 15mg  [Y8657]

## 2010-09-23 NOTE — Consult Note (Signed)
Summary: [facility]   Imported By: Bradly Bienenstock 12/09/2009 17:07:51  _____________________________________________________________________  External Attachment:    Type:   Image     Comment:   External Document

## 2010-09-30 ENCOUNTER — Telehealth: Payer: Self-pay | Admitting: Family Medicine

## 2010-10-03 ENCOUNTER — Ambulatory Visit: Payer: Self-pay | Admitting: Family Medicine

## 2010-10-09 NOTE — Progress Notes (Signed)
Summary: Triage  Phone Note Call from Patient Call back at [phone]   Reason for Call: Talk to Nurse Summary of Call: pt was trying to make an appt here today for foot pain, advised pt we were full & she needed to go to urgent care, pt said she needs to go back to [facility] but needs a referral. will route to triage to advise on what we can do. Initial call taken by: Knox Royalty,  September 30, 2010 8:57 AM  Follow-up for Phone Call        states she has had 3 surgeries on her feet in past. last visit to ortho was over a year ago. she works weekends & can barely walk after the wkend. taking BC powders.  injured foot 3 weeks ago(caught it in a pallet & hurt it pulling it out) & went to UC on [address]. Xrays "fine"  but then they also told her  there was a line in it. she is concerned about a fx. she called [facility]. they need a new referral. she is upset that she did not get pain meds.  Goal is to "work m-f, day hours".  advised to make appt here & get process started to see ortho. she agreed to make an appt Follow-up by: Golden Circle RN,  September 30, 2010 9:04 AM

## 2010-10-20 ENCOUNTER — Telehealth: Payer: Self-pay | Admitting: *Deleted

## 2010-10-20 NOTE — Telephone Encounter (Signed)
States she was picking up glass shards from the ground & sliced her thumb open. Heavy bleeding. Advised firm pressure to thumb & have someone drive her to UC as we have no appts here. She agreed. Last TD was 3 yrs ago.Elijah Birk, Esperanza Richters

## 2010-10-27 ENCOUNTER — Encounter: Payer: Self-pay | Admitting: Family Medicine

## 2010-10-27 ENCOUNTER — Ambulatory Visit (INDEPENDENT_AMBULATORY_CARE_PROVIDER_SITE_OTHER): Payer: Medicaid Other | Admitting: Family Medicine

## 2010-10-27 ENCOUNTER — Ambulatory Visit
Admission: RE | Admit: 2010-10-27 | Discharge: 2010-10-27 | Disposition: A | Discharge status: Home / Self Care | Payer: Medicaid Other | Source: Ambulatory Visit | Attending: Family Medicine | Admitting: Family Medicine

## 2010-10-27 VITALS — BP 143/85 | HR 91 | Temp 98.4°F | Ht 67.0 in | Wt 231.0 lb

## 2010-10-27 DIAGNOSIS — M79609 Pain in unspecified limb: Secondary | ICD-10-CM

## 2010-10-27 DIAGNOSIS — M79671 Pain in right foot: Secondary | ICD-10-CM | POA: Insufficient documentation

## 2010-10-27 DIAGNOSIS — M79672 Pain in left foot: Secondary | ICD-10-CM

## 2010-10-27 NOTE — Progress Notes (Signed)
  Subjective:    Patient ID: Isabel Berger, female    DOB: 1966/06/02, 45 y.o.   MRN: [id number]  HPI R foot pain: Pt is here for referral back to see Dr Lajoyce Corners, [facility], for R foot pain.  Dr Lajoyce Corners performed surgery on this in 2008 after she injured on the job.   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  This job requires her to wear steel-toe boots and she has a lot of pain with it.  Pain starts from heel of R foot and shoots up the leg, to he knee area.  She describes the pain as stabbing pain.  Med she is tried for pain include Ibuprofen 800mg  and also tries BC powder.  She also takes narcotics from her friends if she can find some.    Left foot swelling: Present for 2 months now.  Foot was caught in a pallet at work.  She pulled her foot away and it swelled right away.  Since then she has been swelling.  She was seen by UC on [address] and was told that there may be a fracture.  She was not put in a cast.  Since then she has returned to work and the foot is swollen at the end of each working day.  She also describes squeezing pain.  Pain is relieved with sleep (off her feet) and worsens when she has to stand on her feet a lot.  After a day of work she has tremendous pain in this foot.  Again for pain she takes ibuprofen 800mg  or BC powder.  She is able to ambulate, but has pain with this.    HTN: Pt has not been seen by me since 12/2009.  She has not been taking her BP meds.  Review of Systems Per hpi     Objective:   Physical Exam  Head:  Normocephalic and atraumatic without obvious abnormalities. No apparent alopecia or balding. Neck:  supple and full ROM.   Msk:  Gait is limping, with favoring of R side.  Extremities:  No clubbing, cyanosis.  +edema that is nonpitting on bilateral feet.   R foot: +swelling (nonpitting), no redness, ankle rom intact, no ankle laxity, ankle is stable.  +tenderness on mild palpation of the foot.  No tenderness with rotation of  foot L foot: + swelling (nonpitting), no redness, ankle rom intact although it causes tenderness.  Pt endorses much tenderness with any palpation of entire foot.   Pulses: + 2 DP pulses b/l Neurologic:  alert & oriented X3.       Assessment & Plan:

## 2010-10-27 NOTE — Patient Instructions (Signed)
Please go get Xray of Left foot today. We will call you with appointment for Dr Lajoyce Corners. Please make appointment in 3-4 weeks for blood pressure.

## 2010-10-27 NOTE — Assessment & Plan Note (Signed)
L foot pain and swelling present x 2 months since foot was caught in a palate at work.  She was seen in UC afterwards but I do not have access to xray.  Will get xray today.  It would be odd for foot to continue to swell 2 months after injury.  Pt still ambulating on this.  Will f/u after xray result.

## 2010-10-27 NOTE — Assessment & Plan Note (Addendum)
Pt has had 3 surgeries on R foot by Dr Lajoyce Corners, 2008.  She does not know the name of these procedures.  She has had pain, swelling, and numbness in foot for almost 1 year.  Will refer her back to Dr Lajoyce Corners for evaluation.  If he does not find that she a surgical case, we may consider adding Gabapentin as some of her pain may be neuropathic.

## 2010-10-28 ENCOUNTER — Telehealth: Payer: Self-pay | Admitting: Family Medicine

## 2010-10-28 NOTE — Telephone Encounter (Signed)
Left message that xray of L foot/ankle showed no dislocation or fracture.

## 2010-10-28 NOTE — Telephone Encounter (Signed)
Called pt back.  Pt states that her foot is still painful.  Foot is still swollen.   Pt has appt with Dr Lajoyce Corners in a few minutes.  Advised her to not miss appt.   She can call back here for appt with me or another provider. Pt agreed.

## 2010-10-28 NOTE — Telephone Encounter (Signed)
Pt returned call, was given message re: xray & pt said her foot is still swollen & sore, wants to know what she should do from here?

## 2010-10-29 ENCOUNTER — Other Ambulatory Visit (HOSPITAL_COMMUNITY): Payer: Self-pay | Admitting: Specialist

## 2010-10-29 DIAGNOSIS — R52 Pain, unspecified: Secondary | ICD-10-CM

## 2010-11-04 ENCOUNTER — Ambulatory Visit (HOSPITAL_COMMUNITY)
Admission: RE | Admit: 2010-11-04 | Discharge: 2010-11-04 | Disposition: A | Discharge status: Home / Self Care | Payer: Medicaid Other | Source: Ambulatory Visit | Attending: Specialist | Admitting: Specialist

## 2010-11-04 ENCOUNTER — Encounter (HOSPITAL_COMMUNITY)
Admission: RE | Admit: 2010-11-04 | Discharge: 2010-11-04 | Disposition: A | Discharge status: Home / Self Care | Payer: Medicaid Other | Source: Ambulatory Visit | Attending: Specialist | Admitting: Specialist

## 2010-11-04 DIAGNOSIS — M25473 Effusion, unspecified ankle: Secondary | ICD-10-CM | POA: Insufficient documentation

## 2010-11-04 DIAGNOSIS — R52 Pain, unspecified: Secondary | ICD-10-CM

## 2010-11-04 DIAGNOSIS — M25579 Pain in unspecified ankle and joints of unspecified foot: Secondary | ICD-10-CM | POA: Insufficient documentation

## 2010-11-04 DIAGNOSIS — M25476 Effusion, unspecified foot: Secondary | ICD-10-CM | POA: Insufficient documentation

## 2010-11-04 MED ORDER — TECHNETIUM TC 99M MEDRONATE IV KIT
25.0000 | PACK | Freq: Once | INTRAVENOUS | Status: AC | PRN
  Administered 2010-11-04: 23.7 via INTRAVENOUS

## 2010-11-10 ENCOUNTER — Ambulatory Visit: Payer: Medicaid Other | Admitting: Family Medicine

## 2010-11-11 LAB — URINALYSIS, ROUTINE W REFLEX MICROSCOPIC
Bilirubin Urine: NEGATIVE
Hgb urine dipstick: NEGATIVE
Nitrite: NEGATIVE
Protein, ur: NEGATIVE mg/dL
Specific Gravity, Urine: 1.017 (ref 1.005–1.030)
Urobilinogen, UA: 1 mg/dL (ref 0.0–1.0)

## 2010-11-11 LAB — BASIC METABOLIC PANEL
BUN: 15 mg/dL (ref 6–23)
CO2: 27 mEq/L (ref 19–32)
Chloride: 106 mEq/L (ref 96–112)
Creatinine, Ser: 1.13 mg/dL (ref 0.4–1.2)
GFR calc Af Amer: >60 mL/min (ref >60)
GFR calc non Af Amer: 53 mL/min — ABNORMAL LOW (ref >60)
Potassium: 3.1 mEq/L — ABNORMAL LOW (ref 3.5–5.1)
Sodium: 139 mEq/L (ref 135–145)

## 2010-11-11 LAB — CBC
HCT: 37 % (ref 36.0–46.0)
Hemoglobin: 12.4 g/dL (ref 12.0–15.0)
MCHC: 33.6 g/dL (ref 30.0–36.0)
MCV: 91.4 fL (ref 78.0–100.0)
MCV: 91.3 fL (ref 78.0–100.0)
Platelets: 355 10*3/uL (ref 150–400)
RDW: 13 % (ref 11.5–15.5)

## 2010-11-11 LAB — DIFFERENTIAL
Basophils Relative: 0 % (ref 0–1)
Lymphs Abs: 3.4 10*3/uL (ref 0.7–4.0)
Monocytes Relative: 9 % (ref 3–12)
Neutro Abs: 6 10*3/uL (ref 1.7–7.7)
Neutrophils Relative %: 58 % (ref 43–77)

## 2010-11-11 LAB — URINE CULTURE: Colony Count: 4000

## 2010-11-11 LAB — PROTIME-INR
INR: 0.97 (ref 0.00–1.49)
Prothrombin Time: 12.8 seconds (ref 11.6–15.2)

## 2010-11-11 LAB — GLUCOSE, CAPILLARY
Glucose-Capillary: 110 mg/dL — ABNORMAL HIGH (ref 70–99)
Glucose-Capillary: 127 mg/dL — ABNORMAL HIGH (ref 70–99)
Glucose-Capillary: 131 mg/dL — ABNORMAL HIGH (ref 70–99)

## 2010-11-11 LAB — POCT CARDIAC MARKERS
CKMB, poc: <1 ng/mL — ABNORMAL LOW (ref 1.0–8.0)
Myoglobin, poc: 93.7 ng/mL (ref 12–200)
Troponin i, poc: <0.05 ng/mL (ref 0.00–0.09)

## 2010-11-11 LAB — CARDIAC PANEL(CRET KIN+CKTOT+MB+TROPI)
CK, MB: 1 ng/mL (ref 0.3–4.0)
Relative Index: 0.5 (ref 0.0–2.5)

## 2010-11-11 LAB — URINE MICROSCOPIC-ADD ON

## 2010-11-11 LAB — APTT: aPTT: 26 seconds (ref 24–37)

## 2010-11-11 LAB — CK TOTAL AND CKMB (NOT AT ARMC): Relative Index: 0.6 (ref 0.0–2.5)

## 2010-12-05 ENCOUNTER — Other Ambulatory Visit: Payer: Self-pay | Admitting: Specialist

## 2010-12-05 DIAGNOSIS — M79672 Pain in left foot: Secondary | ICD-10-CM

## 2010-12-07 ENCOUNTER — Ambulatory Visit
Admission: RE | Admit: 2010-12-07 | Discharge: 2010-12-07 | Disposition: A | Discharge status: Home / Self Care | Payer: Medicaid Other | Source: Ambulatory Visit | Attending: Specialist | Admitting: Specialist

## 2010-12-07 DIAGNOSIS — M79672 Pain in left foot: Secondary | ICD-10-CM

## 2010-12-17 ENCOUNTER — Observation Stay (HOSPITAL_COMMUNITY)
Admission: RE | Admit: 2010-12-17 | Discharge: 2010-12-18 | Disposition: A | Discharge status: Home / Self Care | Payer: Medicaid Other | Source: Ambulatory Visit | Attending: Orthopedic Surgery | Admitting: Orthopedic Surgery

## 2010-12-17 DIAGNOSIS — I252 Old myocardial infarction: Secondary | ICD-10-CM | POA: Insufficient documentation

## 2010-12-17 DIAGNOSIS — I2510 Atherosclerotic heart disease of native coronary artery without angina pectoris: Secondary | ICD-10-CM | POA: Insufficient documentation

## 2010-12-17 DIAGNOSIS — F411 Generalized anxiety disorder: Secondary | ICD-10-CM | POA: Insufficient documentation

## 2010-12-17 DIAGNOSIS — E119 Type 2 diabetes mellitus without complications: Secondary | ICD-10-CM | POA: Insufficient documentation

## 2010-12-17 DIAGNOSIS — I10 Essential (primary) hypertension: Principal | ICD-10-CM | POA: Insufficient documentation

## 2010-12-17 LAB — COMPREHENSIVE METABOLIC PANEL
AST: 22 U/L (ref 0–37)
Albumin: 3.9 g/dL (ref 3.5–5.2)
BUN: 11 mg/dL (ref 6–23)
Calcium: 9.8 mg/dL (ref 8.4–10.5)
Chloride: 105 mEq/L (ref 96–112)
Creatinine, Ser: 0.76 mg/dL (ref 0.4–1.2)
GFR calc Af Amer: >60 mL/min (ref >60)
Total Bilirubin: 0.5 mg/dL (ref 0.3–1.2)
Total Protein: 7.2 g/dL (ref 6.0–8.3)

## 2010-12-17 LAB — APTT: aPTT: 26 seconds (ref 24–37)

## 2010-12-17 LAB — GLUCOSE, CAPILLARY
Glucose-Capillary: 122 mg/dL — ABNORMAL HIGH (ref 70–99)
Glucose-Capillary: 126 mg/dL — ABNORMAL HIGH (ref 70–99)
Glucose-Capillary: 112 mg/dL — ABNORMAL HIGH (ref 70–99)

## 2010-12-17 LAB — CBC
MCH: 29.6 pg (ref 26.0–34.0)
MCHC: 33.1 g/dL (ref 30.0–36.0)
Platelets: 385 10*3/uL (ref 150–400)
RDW: 12.9 % (ref 11.5–15.5)

## 2010-12-17 LAB — PROTIME-INR
INR: 0.91 (ref 0.00–1.49)
Prothrombin Time: 12.5 seconds (ref 11.6–15.2)

## 2010-12-17 LAB — SURGICAL PCR SCREEN: MRSA, PCR: NEGATIVE

## 2010-12-18 LAB — GLUCOSE, CAPILLARY: Glucose-Capillary: 105 mg/dL — ABNORMAL HIGH (ref 70–99)

## 2010-12-23 NOTE — Op Note (Signed)
  NAMELORRI, Isabel Berger               ACCOUNT NO.:  [id number]  MEDICAL RECORD NO.:  [id number]           PATIENT TYPE:  O  LOCATION:  5011                         FACILITY:  [facility]  PHYSICIAN:  Nadara Mustard, MD     DATE OF BIRTH:  1966-07-05  DATE OF PROCEDURE:  10/18/2010 DATE OF DISCHARGE:                              OPERATIVE REPORT   PREOPERATIVE DIAGNOSIS:  Longitudinal tear, peroneus brevis, left ankle.  POSTOPERATIVE DIAGNOSIS:  Longitudinal tear, peroneus brevis, left ankle.  PROCEDURE:  Repair of peroneus brevis, left ankle.  SURGEON:  Nadara Mustard, MD  ANESTHESIA:  General plus popliteal block.  ESTIMATED BLOOD LOSS:  Minimal.  ANTIBIOTICS:  Kefzol 2 g.  DRAINS:  None.  COMPLICATIONS:  None.  TOURNIQUET TIME:  Esmarch at the ankle for approximately 30 minutes.  DISPOSITION:  To PACU in stable condition.  INDICATIONS FOR THE PROCEDURE:  The patient is a 45 year old woman with pain in the left ankle secondary to tearing of the peroneus brevis.  She has failed prolonged conservative treatment and presents at this time for surgical intervention.  MRI scan confirms a tear of the peroneus brevis and presents at this time for surgical intervention.  Risks and benefits were discussed including infection, neurovascular injury, persistent pain, need for additional surgery.  The patient states she understands and wished to proceed at this time.  DESCRIPTION OF PROCEDURE:  The patient was brought to OR room 10 after undergoing a popliteal block.  She then underwent general anesthetic. After adequate level of anesthesia was obtained, the patient's left lower extremity was prepped using DuraPrep and draped into a sterile field.  An incision was made along the course of the peroneal tendons. The sural nerve was protected and retracted.  The retinaculum was incised, and the peroneal tendons were delivered in the surgical field. The peroneus longus was traced along its  root and was traced distal to the knot of Sherilyn Cooter.  There was no os peroneus, no bone or soft tissue within the peroneus longus.  Peroneus longus was intact in its continuity and continuity for its entire length.  On examination of the peroneus brevis, there was a longitudinal tearing of the peroneus brevis which represented less than 10% of the tendon.  This was debrided as well as attached muscle which extended down into the canal.  The muscle and torn tendon were resected.  The tendon was repaired.  The retinaculum was repaired using 2-0 FiberWire.  The subcu was closed using 2-0 Vicryl.  Skin was closed using 2-0 nylon with a modified vertical mattress suture.  The tourniquet was deflated.  Prior to closing, the wound was irrigated with normal saline.  Hemostasis was obtained.  The wound was covered with Adaptic, orthopedic sponges, Kerlix, and Coban.  The patient was extubated, taken to PACU in stable condition.  Plan for overnight observation.     Nadara Mustard, MD     MVD/MEDQ  D:  12/17/2010  T:  12/17/2010  Job:  [id number]  Electronically Signed by Aldean Baker MD on 12/23/2010 04:29:32 PM

## 2011-01-09 NOTE — H&P (Signed)
[facility]  Patient:    MARKEITA, ALICIA                      MRN: [id number] Adm. Date:  [id number] Attending:  Deniece Ree                         History and Physical  HISTORY:                      The patient is a 45 year old multiparous female who is desirous of permanent sterilization.  All of the patients questions were answered as they pertained to the sterilization procedure, which she understands and accepts.  She understands the possible complications.  She also understands that this procedure is intended to be permanent.  However, it cannot be guaranteed.  She has no medical problems known.  PAST MEDICAL HISTORY:         The patient has had tow vaginal deliveries.  PHYSICAL EXAMINATION:  GENERAL:                      Well-developed, well-nourished female in no acute distress.  HEENT:                        Within normal limits.  NECK:                         Supple.  BREASTS:                      Without masses, tenderness or discharge.  LUNGS:                        Clear to auscultation and percussion.  HEART;                        Normal sinus rhythm without murmurs, rubs, or gallops.  ABDOMEN:                      Obese.  However, benign.  EXTREMITIES:                  Within normal limits.  NEUROLOGIC:                   Within normal limits.  PELVIC:                       External genitalia and BUS within normal limits. The vagina is clear.  The cervix is firm and nontender.  The uterus is normal size, shape and consistency.  Adnexa benign.  DIAGNOSIS:                    Multiparity, desirous of permanent sterilization.  PLAN:                         Laparoscopic bilateral tubal cauterization with tubal resection. DD:  02/13/00 TD:  02/16/00 Job: [id number]

## 2011-01-09 NOTE — H&P (Signed)
[facility]  Patient:    Isabel Berger                       MRN: [id number] Adm. Date:  [id number] Disc. Date: [id number] Attending:  Deniece Ree                         History and Physical  HISTORY OF PRESENT ILLNESS:   The patient is a 45 year old gravida 2, para 1, at term, who is admitted in labor.  PAST MEDICAL HISTORY:         She did have a delivery in 1994.  She had no prenatal complications.  PHYSICAL EXAMINATION:  GENERAL:                      Well-developed, well-nourished, gravid female, in  labor-type distress.  HEENT:                        Within normal limits.  NECK:                         Supple.  BREASTS:                      Without masses, tenderness, or discharge.  LUNGS:                        Clear to percussion and auscultation.  HEART:                        Normal sinus rhythm, without murmur, rub, or gallop.  ABDOMEN:                      Term, gravid.  Fetal heart beats in the left lower quadrant.  EXTREMITIES:                  Within normal limits.  NEUROLOGIC:                   Within normal limits.  PELVIC:                       Cervix 5 cm, vertex, at 0 station.  DIAGNOSIS:                    Intrauterine pregnancy at term.  PLAN:                         Labor and delivery. DD:  12/03/99 TD:  12/03/99 Job: [id number]

## 2011-01-09 NOTE — Op Note (Signed)
NAME:  Isabel Berger, Isabel Berger                         ACCOUNT NO.:  [id number]   MEDICAL RECORD NO.:  [id number]                   PATIENT TYPE:  AMB   LOCATION:  [facility]                                 FACILITY:  [facility]   PHYSICIAN:  Lindaann Slough, M.D.               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   PROCEDURE DONE:  1. Cystoscopy.  2. Urethral dilation.   SURGEON:  Dr. Brunilda Payor   ANESTHESIA:  General.   INDICATION:  The patient is a 46 year old female, who had been complaining  of suprapubic pain after urination, sensation of incomplete emptying of the  bladder, and nocturia x 3 since her hysterectomy in September 2004.  She was  treated with Estrace cream, but her symptoms have persisted.  She is  scheduled for cystoscopy.   DESCRIPTION OF PROCEDURE:  Under general anesthesia, the patient was prepped  and draped and placed in the dorsal lithotomy position.  A #22 Wappler  cystoscope was inserted in the bladder.  The bladder mucosa is normal.  There is not stone or tumor in the bladder.  The ureteral orifices are in  normal position and shape with clear efflux.  There is no evidence of  submucosal hemorrhage.  The bladder capacity is 1100 mL.   The cystoscope was then removed.  The urethra was dilated with #32 [place].  Then, 400 mg of Pyridium and 15 mL of Marcaine 0.5% were instilled in the  bladder.  Then, under bimanual examination, there is no evidence of pelvic  mass.   The patient tolerated the procedure well and left the OR in satisfactory  condition to postanesthesia care unit.                                               Lindaann Slough, M.D.    MN/MEDQ  D:  01/04/2004  T:  01/04/2004  Job:  [id number]   cc:   Lilla Shook, M.D.  [address], [address]  [place]  [place] [address]  Fax: [phone]

## 2011-01-09 NOTE — Op Note (Signed)
[facility]  Patient:    Isabel Berger, Isabel Berger Visit Number: [id number] MRN: [id number]          Service Type: DSU Location: [facility] Attending Physician:  Wandalee Ferdinand Dictated by:   Rudy Jew Ashley Royalty, M.D. Proc. Date: 03/14/02 Admit Date:  03/14/2002 Discharge Date: 03/14/2002                             Operative Report  PREOPERATIVE DIAGNOSES:       1. Left adnexal cyst.                               2. Left lower quadrant discomfort, possibly                                  secondary to #1.  POSTOPERATIVE DIAGNOSES:      1. Left adnexal cyst.                               2. Left lower quadrant discomfort, possibly                                  secondary to #1.                               3. Pathology pending.  OPERATION:                    1. Diagnostic/operative laparoscopy.                               2. Left oophorectomy and left partial                                  salpingectomy.  SURGEON:                      Rudy Jew. Ashley Royalty, M.D.  ANESTHESIA:                   General endotracheal.  ESTIMATED BLOOD LOSS:         Less than 50 cc.  COMPLICATIONS:                None.  PACKS AND DRAINS:             None.  DESCRIPTION OF PROCEDURE:     The patient was taken to the operating room and placed in the dorsal supine position.  After adequate general endotracheal anesthesia was administered, she was placed in the lithotomy position and prepped and draped in the usual manner for abdominal and vaginal surgery.  A posterior weighted retractor was placed per vagina.  The anterior lip of the cervix was grasped with a single-tooth tenaculum.  Jarcho uterine manipulator was placed per cervix.  The bladder was drained with a red rubber catheter. Approximately 2 cc of 0.25% Marcaine was instilled into the umbilical scar. A 1.5 cm infraumbilical incision was made in the transverse plain.  A size 10-11 disposable  laparoscopic trocar  was placed into the abdominal cavity. Its location was verified by placement of the laparoscope.  There was no evidence of trauma.  Pneumoperitoneum was created with CO2 and maintained throughout the procedure.  Next, left and right suprapubic 5 mm trocars were placed using direct visualization and transillumination techniques.  Marcaine was used prior to placement (4 cc total).  The pelvis was then thoroughly surveyed.  The uterus was normal size, shape and contour.  The fallopian tubes were interrupted bilaterally consistent with a know history of a tubal sterilization procedure.  The right ovary was normal size, shape and contour without evidence of any cysts or endometriosis.  There was a stigma present on the right ovary.  Appropriate photographs were obtained.  The left ovary was approximately 4 cm in greatest diameter and appeared to be a multicystic structure.  There was a small amount of gelatinous material nearby.  There was fluid in the cul-de-sac which was aspirated and submitted to pathology for cytologic studies.  As the patient had already had a tubal sterilization procedure and the mass was noted to be complex, the decision was made to remove the left ovary and any portion of the fallopian tube that was attached to it.  The ureters were identified well below the plain of dissection. Thereafter, the electrocautery device was introduced and used to cauterize the infundibulopelvic ligament.  The mesosalpinx and ovarian attachments were cauterized and incised with the tripolar cautery.  The specimen was easily delivered into the cul-de-sac for later removal.  Hemostasis was easily obtained with the tripolar.  Next, the left lower quadrant incision was extended to accommodate a size 10 trocar.  Endocatch bag was placed into the abdominal cavity.  The specimen was placed in the Endocatch bag.  The left lower quadrant incision was then extended to accommodate the specimen.   The specimen was removed in the bag intact and submitted to pathology for histologic studies.  Next, the left lower quadrant incision was closed by closing the fascia initially with a 0 Vicryl running suture.  Subcutaneous tissues were closed with 0 Vicryl in an interrupted fashion.  The skin was closed with 3-0 chromic in a subcuticular fashion.  Next, the laparoscope was reintroduced and pneumoperitoneum recreated.  The surgical area was irrigated. Hemostasis was noted.  At this point, the patient was felt to have benefited maximally from the surgical procedure.  The abdominal instruments were then removed.  Pneumoperitoneum was relieved.  The remaining fascial defects were closed with 0 Vicryl in an interrupted fashion.  The skin was closed with 3-0 chromic in a subcuticular fashion.  The vaginal instruments were removed, and hemostasis noted.  Thea patient was then taken to the recovery room in excellent condition. Dictated by:   Rudy Jew Ashley Royalty, M.D. Attending Physician:  Wandalee Ferdinand DD:  03/14/02 TD:  03/18/02 Job: [id number]

## 2011-01-09 NOTE — Op Note (Signed)
Isabel Berger, Isabel Berger                         ACCOUNT NO.:  [id number]   MEDICAL RECORD NO.:  [id number]                   PATIENT TYPE:  AMB   LOCATION:  [facility]                                  FACILITY:  [facility]   PHYSICIAN:  James A. Ashley Royalty, M.D.             DATE OF BIRTH:  23-Nov-1965   DATE OF PROCEDURE:  02/09/2003  DATE OF DISCHARGE:  02/09/2003                                 OPERATIVE REPORT   PREOPERATIVE DIAGNOSES:  1. Right adnexal cyst.  2. Status post left salpingo-oophorectomy.  3. Pelvic pain.   POSTOPERATIVE DIAGNOSES:  1. Right adnexal cyst.  2. Status post left salpingo-oophorectomy.  3. Pelvic pain.   PATHOLOGY:  Pending.   PROCEDURES:  1. Diagnostic/operative laparoscopy.  2. Right ovarian cystectomy.   SURGEON:  Rudy Jew. Ashley Royalty, M.D.   ANESTHESIA:  General endotracheal.   ESTIMATED BLOOD LOSS:  50 mL.   COMPLICATIONS:  None.   PACKS AND DRAINS:  None.   DESCRIPTION OF PROCEDURE:  The patient was taken to the operating room and  placed in the dorsal supine position.  After adequate general anesthesia was  administered she was prepped and draped in the usual manner for abdominal  and vaginal surgery.  A posterior weighted retractor was placed per vagina.  The anterior lip of the cervix was grasped with a single tooth tenaculum.  A  short-bladed uterine manipulator was placed per cervix and held in place  with a tenaculum.  The bladder was drained with a red rubber catheter.  A  1.5 cm infraumbilical incision was made.  A size 10-11 disposable  laparoscopic trocar was then placed into the abdominal cavity.  Its location  was verified by placement of the laparoscope.  There was no evidence of any  trauma.  Pneumoperitoneum was created with CO2 and maintained throughout the  procedure.  Next, two 5 mm trocars were placed in the left and right lower  quadrants respectively.  Transillumination and direct visualization  techniques were employed.  The  pelvis was thoroughly surveyed.  The uterus  was normal size, shape, and contour without evidence of any fibroids or  endometriosis.  The left adnexa was surgically absent.  In the right adnexa  there was a 4 cm ovarian cyst.  The cyst was without surface excrescences.  The fallopian tube appeared normal.  Appropriate photos were obtained.  The  remainder of the peritoneal surfaces were smooth and glistening including  the anterior and posterior cul-de-sacs.  At this point the monopolar cautery  was introduced into the field, allowing incision of the right ovarian  capsule along the antimesenteric border.  The capsule was divided, exposing  the cyst which was removed easily and submitted to pathology for histologic  studies.  It should be mentioned that prior to the ovarian manipulation that  peritoneal washings were obtained for cytology.  Hemostasis was obtained  with the bipolar cautery.  Copious irrigation was accomplished.  Hemostasis  was noted.  At this point the patient was felt to have benefited maximally  from the surgical procedure.  The abdominal instruments were then removed  and pneumoperitoneum evacuated.  Fascial defects were closed with 0 Vicryl  in an interrupted fashion.  The skin was closed with 3-0 Monocryl in a  subcuticular fashion.                                               James A. Ashley Royalty, M.D.    JAM/MEDQ  D:  03/14/2003  T:  03/15/2003  Job:  [id number]

## 2011-01-09 NOTE — Op Note (Signed)
NAME:  Isabel Berger, Isabel Berger                         ACCOUNT NO.:  [id number]   MEDICAL RECORD NO.:  [id number]                   PATIENT TYPE:  AMB   LOCATION:  SDC                                  FACILITY:  [facility]   PHYSICIAN:  Dineen Kid. Rana Snare, M.D.                 DATE OF BIRTH:  [birth year]   DATE OF PROCEDURE:  11/15/2003  DATE OF DISCHARGE:                                 OPERATIVE REPORT   PREOPERATIVE DIAGNOSES:  Pelvic pain and right ovarian cyst.   POSTOPERATIVE DIAGNOSES:  Pelvic pain and right ovarian cyst, pelvic  adhesions.   PROCEDURE:  Laparoscopic right salpingo-oophorectomy and lysis of adhesions.   SURGEON:  Dineen Kid. Rana Snare, M.D.   ANESTHESIA:  General endotracheal.   INDICATIONS FOR PROCEDURE:  Isabel Berger is a 45 year old, G2, P2 with  persistent right lower quadrant pain who had a hysterectomy in four months  ago which was uncomplicated. She desired preservation of the right ovary at  that time due to the fact that she has had her left ovary and tube taken out  years ago due to the ovarian cyst. At that time, adhesions and a small cyst  were noted and were taken care of at that time. She subsequently has had  worsening pain on the right side. Ultrasound shows a 7.4 cm multicystic mass  in the right adnexa. She desires definitive surgical intervention and  requests right salpingo-oophorectomy knowing that she will be undergoing  menopause if the remaining ovary is removed. She does understand the risks  associated with menopause and hormone replacement therapy and also  understands that this may not alleviate the pain, it could recur or it may  not help it at all. She understands that there are risks including the risk  of infection, bleeding, damage to bowel, bladder or ureters, risks  associated with anesthesia and blood loss and gives her informed consent.   FINDINGS:  Adhesions from the bowel and omentum to the right adnexa. There  are also peritoneal adhesions to  the cuff which had acidic or serous  appearing fluid within the adhesions.  Normal appearing appendix, normal  appearing liver.   DESCRIPTION OF PROCEDURE:  After adequate analgesia, the patient was placed  in the dorsal lithotomy position, she was sterilely prepped and draped. The  bladder was sterilely drained. A speculum was placed and a sponge stick was  placed in the vagina.  Once an infraumbilical skin incision was made, a  Veress needle was inserted, the abdomen was insufflated with dullness to  percussion. An 11 mm trocar was then inserted. The laparoscope was inserted  and the above findings were noted. A 5 mm trocar was inserted 2  fingerbreadths above the pubic symphysis in the midline under direct  visualization.  Endoshears were used to dissect the adhesions from the cul-  de-sac and the bowel from the right adnexal complex.  A gyrus  tripolar  LigaSure was used in a similar fashion to coagulate and dissect.  The right  ovary was freed from the bowel and the scar tissue was freed from the cuff.  It was elevated with the grasper and dissection was carried out along the  infundibulopelvic ligament up across the broad ligament and the round  ligament with hemostasis achieved with the bipolar and dissection with the  tripolar.  This was carried out across the remaining portions of the broad  ligaments with removal of the tube and ovary intact. Careful examination of  the underling vessels including the underlying ureter revealed peristalsis  and no apparent damage noted.  Hemostasis was also achieved.  The ovary was  morcellated.  __________ laparoscope, 240 mL of acidic fluid in the cul-de-  sac was sent for cytology.  After careful examination of the pelvis and cul-  de-sac, no residual bleeding, no remaining adhesions noted, the abdomen was  desufflated, the trocars removed, the skin incisions closed with a #0 Vicryl  in the fascia, 3-0 Vicryl Rapide subcuticular stitch in the  umbilical  incision and a horizontal mattress and 3-0 Vicryl Rapide in the 5 mm side  with good approximation and good hemostasis. The incisions were injected  with 0.25% Marcaine, 10 mL total used.  The patient tolerated the procedure  well and was table on transfer to the recovery room. Sponge, needle and  instrument counts were correct x3.  The patient received 1 g of Rocephin  preoperatively.   DISPOSITION:  The patient will be discharged home with followup in the  office in 2-3 weeks.  Sent home with a prescription for Vicodin.  Told to  return for increased pain, fever or bleeding.                                               Dineen Kid Rana Snare, M.D.    DCL/MEDQ  D:  11/15/2003  T:  11/16/2003  Job:  [id number]

## 2011-01-09 NOTE — H&P (Signed)
Isabel Berger, Isabel Berger                         ACCOUNT NO.:  [id number]   MEDICAL RECORD NO.:  [id number]                   PATIENT TYPE:  AMB   LOCATION:  SDC                                  FACILITY:  [facility]   PHYSICIAN:  James A. Ashley Royalty, M.D.             DATE OF BIRTH:  [birth year]   DATE OF ADMISSION:  DATE OF DISCHARGE:                                HISTORY & PHYSICAL   HISTORY OF PRESENT ILLNESS:  The patient is a 45 year old, gravida 2, para  2, who presented for annual examination on Jan 17, 2003.  At the time of  that encounter she complained of intramenstrual bleeding of approximately  four month's duration.  She also complained of intermittent pelvic  discomfort, midline, not particularly cyclic.  She has definite dysmenorrhea  and very significant dyspareunia.  She is status post bilateral tubal  sterilization procedure.  She presented today for sonohysterogram and  endometrial biopsy.  The ultrasound revealed a  2.6 cm fluid collection near the left lateral margin of the vagina.  The  uterus itself was 7.8 x 6.5 cm.  The right adnexa contained a 3 cm slowing  marginated cyst.  The left adnexa was surgically absent.  Sonohysterogram  was negative.  Endometrial biopsy was performed with a __________ type cut  using sterile technique.   The patient went on to say later in the encounter that she sleeps with a  heating pad each and every night.  She states that the discomfort is  sufficiently debilitating to her to warrant surgical intervention.  She  denies any changes in bowel habits such as anorexia, constipation, diarrhea,  passage of blood, etc.  She similarly denies any urinary symptoms such as  hematuria, dysuria, etc.  She is seen for diagnostic/operative laparoscopy  and indicated procedures.   MEDICATIONS:  None.   PAST MEDICAL HISTORY:  Negative.   PAST SURGICAL HISTORY:  BTSP.   ALLERGIES:  No known drug allergies.   FAMILY HISTORY:  Positive for  hypertension and diabetes.   SOCIAL HISTORY:  The patient denies use of tobacco or significant alcohol.   REVIEW OF SYSTEMS:  Noncontributory.   PHYSICAL EXAMINATION:  GENERAL:  A well-developed, well-nourished pleasant  black female in no acute distress.  VITAL SIGNS:  Afebrile, vital signs stable.  SKIN:  Warm and dry without lesions.  LYMPH:  There are no supraclavicular, cervical, or inguinal adenopathy.  HEENT:  Normocephalic.  NECK:  Supple without thyromegaly.  CHEST:  Lungs are clear.  CARDIAC:  Regular rate and rhythm without murmurs, rubs, or gallops.  BREASTS:  Deferred.  ABDOMEN:  Soft, nontender, without masses or organomegaly.  Bowel sounds are  active.  EXTREMITIES:  Full range of motion without edema, cyanosis, or CVA  tenderness.  PELVIC:  Deferred until examination under anesthesia.   IMPRESSION:  1. Right adnexal cyst.  2. Status post left salpingo-oophorectomy and BTSP in separate  settings.  3. Lower abdominal discomfort, debilitating, differential includes     endometriosis, adhesions, ovarian cyst, primary GI, etc.  4. Abnormal uterine bleeding.  5. Dysmenorrhea and dyspareunia, rule out endometriosis.   PLAN:  Diagnostic/operative laparoscopy.   Risks, benefits, complications, and alternatives were fully discussed with  the patient.  The possibility of right ovarian cystectomy or right salpingo-  oophorectomy discussed and accepted.  The possible need for exploratory  laparotomy discussed and accepted.  Questions invited and answered.                                               James A. Ashley Royalty, M.D.    JAM/MEDQ  D:  02/08/2003  T:  02/08/2003  Job:  [id number]

## 2011-01-09 NOTE — H&P (Signed)
NAME:  Isabel Berger, Isabel Berger                         ACCOUNT NO.:  [id number]   MEDICAL RECORD NO.:  [id number]                   PATIENT TYPE:  OBV   LOCATION:  NA                                   [facility]   PHYSICIAN:  Dineen Kid. Rana Snare, M.D.                 DATE OF BIRTH:  1966-04-19   DATE OF ADMISSION:  DATE OF DISCHARGE:                                HISTORY & PHYSICAL   HISTORY OF PRESENT ILLNESS:  Isabel Berger is a 45 year old, G2, P2, who was  referred to me for evaluation of pelvic pain and discomfort.  She has in the  past had right ovarian cystectomy for pelvic pain just this last summer and  has continued to have pain which occurs during the week before her period.  She also has irregular bleeding, bleeding every 2-4 weeks.  She has had a  left salpingo-oophorectomy and right tubal ligation, so contraception is not  an issue, but she has tried birth control pills with no success.  She  continued to bleed even worse on the birth control pills.  She underwent a  sonohysterogram, which shows am uterus that measures 9 x 5 x 5.2 with  several small fibroids, which are most intramural in location, and also a 14  mm density within the posterior endometrial cavity which appears to  represent a submucosal fibroid.  She also has severe dysmenorrhea and  dyspareunia and with the bleeding has bled to a hemoglobin of 11.4.  She has  no further childbearing desires and desires a hysterectomy and presents for  this.   PAST MEDICAL HISTORY:  1. Irritable bowel syndrome.  2. She is an adult onset diabetic currently on Glucophage.  3. History of anxiety and depression.   PAST OBSTETRICAL HISTORY:  She has had two vaginal deliveries, the largest  was 7 pounds 8 ounces.   MEDICATIONS:  1. Glucophage 500 mg p.o. b.i.d.  2. Lexapro 20 mg b.i.d.  3. Protonix 40 mg daily.  4. __________ 40 mg daily.  5. Vicodin and ibuprofen as needed for pain.   PAST SURGICAL HISTORY:  1. She has a  history of left ovary and tube removed.  2. She has had a right tubal ligation.  3. In July 2004, she had a right ovarian cystectomy.   ALLERGIES:  She has no known drug allergies.   PHYSICAL EXAMINATION:  VITAL SIGNS:  Blood pressure 110/78.  HEART:  Regular rate and rhythm.  LUNGS:  Clear to auscultation bilaterally.  ABDOMEN:  Obese and nontender.  PELVIC:  The uterus is anteverted, mobile, and nontender.  No adnexal masses  are palpable.   DIAGNOSTIC STUDIES:  The ultrasound as above also shows a 2.9 cm simple cyst  in the right ovary.    IMPRESSION:  1. Dysmenorrhea.  2. Dyspareunia.  3. Fibroids.  4. Menometrorrhagia.  5. Questionable endometrial mass versus submucosal fibroid.  PLAN:  Isabel Berger has been offered different management options, such as another  attempt at trying oral contraceptive agents.  Will proceed with a  hysterectomy.  Because she has persistent pain with intercourse and the  abnormal bleeding which has not responded to oral contraceptive agents, she  requests hysterectomy. Plan to proceed with laparoscopically-assisted  vaginal hysterectomy.  The risks and benefits were discussed at length which  include the risks of infection, bleeding, damage to the bowel, bladder, and  ureters, risks associated with infection, risks associated with anesthesia,  the possibility of not being able to alleviate the pain or worsening of the  pelvic pain, and risks associated with blood transfusion.  We also plan to  perform an ovarian cystectomy at that time.  I would recommend preservation  of that ovary if possible since she is 37.  She understands the risks and  gives her informed consent.                                               Dineen Kid Rana Snare, M.D.    DCL/MEDQ  D:  05/23/2003  T:  05/23/2003  Job:  [id number]

## 2011-01-09 NOTE — Consult Note (Signed)
Isabel Berger, Isabel Berger               ACCOUNT NO.:  [id number]   MEDICAL RECORD NO.:  [id number]          PATIENT TYPE:  INP   LOCATION:  3714                         FACILITY:  [facility]   PHYSICIAN:  Corky Crafts, MDDATE OF BIRTH:  1966-02-07   DATE OF CONSULTATION:  11/18/2005  DATE OF DISCHARGE:                                   CONSULTATION   REFERRING PHYSICIAN:  Theressa Millard, M.D.   CHIEF COMPLAINT:  Chest pain.   HISTORY OF PRESENT ILLNESS:  The patient is a 44 year old woman with history  of high blood pressure and type 2 diabetes along with panic and anxiety.  She reported a sudden onset of sharp nonexertional chest pain.  She  described it as a 12/10 in intensity radiating to her left arm with numbness  and tingling.  She also reported some transient fascial numbness.  She  reported some shortness of breath, diaphoresis and dizziness, no nausea or  vomiting was noted.  She did not have syncope or any visual disturbance.  She came to the emergency room.  Her pain resolved after five hours.  Currently, she is pain-free.  She has been pain-free ever since the relief  of her pain.  We were asked to evaluate her chest pain.   The patient does a lot of walking during the day.  She states that she has  never had chest pain while walking up stairs.  She does occasionally get  short of breath.  She feels it is likely secondary to being overweight.   PAST MEDICAL HISTORY:  1.  Hypertension.  2.  Diabetes.  3.  History of anxiety.   ALLERGIES:  NO KNOWN DRUG ALLERGIES.   CURRENT MEDICATIONS:  1.  Lexapro 10 mg daily.  2.  Glucophage XR 500 mg b.i.d.  3.  Byetta 5 mg b.i.d.   PAST SURGICAL HISTORY:  Hysterectomy.   FAMILY HISTORY:  No significant early coronary artery disease.   SOCIAL HISTORY:  Patient has two children.  She works for [employer].  She does  not use tobacco, alcohol or illicit drugs.  She does not have a specific  exercise regimen.   REVIEW OF SYSTEMS:   No recent fevers or chills.  No weight loss.  Chest  pain, as described above.  Shortness of breath, as described above.  No  nausea or vomiting.  No diarrhea.  No focal weakness.  No rash.   PHYSICAL EXAMINATION:  GENERAL APPEARANCE:  She is awake and alert, no  apparent distress.  VITAL SIGNS:  Blood pressure 111/83, pulse 82.  HEENT:  Head normocephalic and atraumatic.  Eyes:  Extraocular movements  intact.  Mouth:  Oropharynx is clear.  NECK:  Supple.  No JVD, no carotid bruits.  LUNGS:  Clear to auscultation bilaterally.  CARDIOVASCULAR:  Regular rate and rhythm, S1 and S2.  No murmurs, rubs, or  gallops.  ABDOMEN:  Soft, nontender and nondistended.  EXTREMITIES:  No edema.  NEUROLOGIC:  No focal, motor or sensory deficits.  SKIN:  No rash.  BACK:  No kyphosis or scoliosis.  PSYCHIATRIC:  Normal mood  and affect.   LABORATORY DATA:  Troponin negative x3.  Total CK slightly elevated at 299,  normal MB fraction 2.7.  Potassium 3.3, creatinine 0.9.   EKG shows normal sinus rhythm.  No pathologic Q-waves.  Nonspecific STT wave  changes in the inferior leads.  There were no clear acute ischemic changes.  No abnormalities on telemetry.   MEDICAL DECISION MAKING:  Patient is a 45 year old with atypical chest pain.   PLAN:  1.  Would like to evaluate this patient with a stress test.  The stress      Cardiolite cannot be done in the hospital today.  We will schedule this      test in our office as an outpatient.  2.  Continue aggressive diabetes control.  3.  Currently, blood pressure is well controlled.  4.  Will also add on TSH to evaluate the elevated CK.  5.  Will replete potassium.  6.  I will see the patient at the time of her stress test.      Corky Crafts, MD  Electronically Signed     JSV/MEDQ  D:  11/18/2005  T:  11/19/2005  Job:  [id number]

## 2011-01-09 NOTE — H&P (Signed)
[facility]  Patient:    CHIHIRO, FREY Visit Number: [id number] MRN: [id number]          Service Type: DSU Location: [facility] Attending Physician:  Wandalee Ferdinand Dictated by:   Rudy Jew Ashley Royalty, M.D. Admit Date:  03/14/2002                           History and Physical  Please have available at [facility]patient area at 11:30 a.m. on March 14, 2002.  HISTORY OF PRESENT ILLNESS:   This is a 45 year old gravida 2, para 2, who presented June 24, 2001, complaining of lower abdominal discomfort.  She had recently seen Dr. Baron Hamper at [facility].  An ultrasound was obtained at [facility] which revealed an ovarian cyst.  Subsequent examination of the ultrasound revealed a simple cyst on the right adnexa.  The lower abdominal discomfort resolved at that time.  On November 16, 2001, the patient presented with recurrent lower abdominal discomfort, left greater than right.  She had an ultrasound performed on November 17, 2001, which revealed a 4.6 cm thin wall cyst on the right adnexa.  In addition, there was a 1.2 cm cyst versus cystic follicle on the left adnexa.  The patient was not currently debilitated by the lower abdominal discomfort.  Hence, the decision was made to proceed with repeat ultrasound in six to eight weeks.  She returned Dec 29, 2001, for repeat ultrasound, and the cyst in the right adnexa was noted to be absent.  In the left adnexa, there was a 1.6 cm cyst versus cystic follicle.  The patient returned February 22, 2002, complaining of abnormal uterine bleeding and left lower quadrant discomfort of one months duration.  She returned for sonohistogram and endometrial biopsy.  The sonohistogram revealed a 1.7 and 1.6 cm echo-free, smooth, marginated cyst in the left adnexa. Sonohistogram revealed no endometrial defects.  Endometrial biopsy returned showing benign proliferative endometrium.  The patient stated her  left lower quadrant pain was sufficiently debilitating to warrant surgical intervention, and she presents for diagnostic/operative laparoscopy with possible left ovarian cystectomy and indicated procedures.  MEDICATIONS:                  None.  PAST MEDICAL HISTORY:         Medical: Negative.  Surgical: ______  performed by Dr. ______ .  ALLERGIES:                    None.  FAMILY HISTORY:               Positive for hypertension and diabetes.  SOCIAL HISTORY:               The patient denies use of tobacco or significant alcohol.  REVIEW OF SYSTEMS:            Noncontributory.  PHYSICAL EXAMINATION:  GENERAL:                      Well-developed, well-nourished, pleasant female in no acute distress.  VITAL SIGNS:                  Afebrile, vital signs stable.  SKIN:                         Warm and dry without lesions.  LYMPH:  There is no supraclavicular, cervical, or inguinal adenopathy.  HEENT:                        Normocephalic.  NECK:                         Supple without thyromegaly.  CHEST:                        Lungs are clear.  HEART:                        Regular rate and rhythm without murmurs, gallops, or rubs.  BREASTS:                      Exam is deferred.  ABDOMEN:                      Soft and nontender without masses or organomegaly.  Bowel sounds are active.  MUSCULOSKELETAL:              Full range of motion without edema, cyanosis, or CVA tenderness.  PELVIC:                       Examination deferred until examination under anesthesia.  IMPRESSION:                   1. Left adnexal cysts of 1.7 to 1.6 cm in                                  greatest diameter, respectively.                               2. Left lower quadrant discomfort, possibly                                  secondary to #1.                               3. Differential includes endometriosis,                                  primary  gastrointestinal, benign ovarian                                  neoplasm, etc.  PLAN:                         Diagnostic/operative laparoscopy.  Risks, benefits, complications, and alternatives have been discussed with the patient.  The possible need for unilateral salpingo-oophorectomy was discussed and accepted.  Possible need for exploratory laparotomy discussed and accepted.  Questions invited and answered. Dictated by:   Rudy Jew Ashley Royalty, M.D. Attending Physician:  Wandalee Ferdinand DD:  03/14/02 TD:  03/14/02 Job: [id number]

## 2011-01-09 NOTE — H&P (Signed)
NAME:  Isabel Berger, Isabel Berger                         ACCOUNT NO.:  [id number]   MEDICAL RECORD NO.:  [id number]                   PATIENT TYPE:  AMB   LOCATION:  SDC                                  FACILITY:  [facility]   PHYSICIAN:  Dineen Kid. Rana Snare, M.D.                 DATE OF BIRTH:  [birth year]   DATE OF ADMISSION:  11/15/2003  DATE OF DISCHARGE:                                HISTORY & PHYSICAL   HISTORY OF PRESENT ILLNESS:  [name] is a 45 year old G2, P2 with  persistent right lower quadrant pain who presented to me earlier this March.  She has been having pain off and on for the last month.  She does have a  history of recurrent right ovarian cyst.  She had recently undergone a  laparoscopic assisted vaginal hysterectomy 4 months ago at which time lysis  of adhesions were carried out and the right adnexa continues to have pain  and discomfort on that side.  Ultrasound was performed showing a 7.4 cm  multicystic mass on the right adnexa which 3.9 cm of this appears to be  hemorrhagic.  Patient presents today for laparoscopic right salpingo-  oophorectomy.   PAST MEDICAL HISTORY:  Irritable bowel syndrome.  She is also an adult-onset  diabetic and has history of depression and anxiety disorder and  hypertension.   PAST SURGICAL HISTORY:  Left salpingo-oophorectomy and tubal removal a  number of years ago.  She has also had right ovarian cystectomies in the  past.   MEDICATIONS INCLUDE:  Glucophage, Lexapro, Protonix, __________, Vicodin as  needed.   ALLERGIES:  She has no known drug allergies.   PHYSICAL EXAMINATION:  VITALS:  Blood pressure is 128/88.  HEART:  Regular rate and rhythm.  LUNGS:  Clear to auscultation bilaterally.  ABDOMEN:  Nondistended, nontender.  PELVIC:  Tenderness and fullness in the right adnexa.   ULTRASOUND:  Ultrasound shows again a 7.4 x 5.5 cm multicystic mass in the  right adnexal area with a probable hydrosalpinx.   IMPRESSION AND PLAN:  1. Pelvic  pain.  2. Right ovarian cyst.   History of recurrent right ovarian cyst and pelvic pain associated with this  right adnexa.  Patient desires definitive surgical removal of the right  ovarian cyst.  Plan laparoscopic right salpingo-oophorectomy.  She  understands that this will make her menopausal.  She understands the risks  associated with menopause.  She also understands the risks associated with  surgery which include but not limited to risk of infection, bleeding, damage  to bowel, bladder, ureters, ovary, risks associated with anesthesia, risks  associated with blood loss, the possibility this may not alleviate the  pelvic pain or worsen the pelvic pain.  She understands that this will make  her menopausal.  We discussed hormone replacement therapy.  She also states  she understands the risk of hormone replacement therapy and we will  place her on Gynodiol 2 mg to begin the day after the surgery.  Also did  write her a prescription for Vicodin (#50) to take in the meantime for the  pain since she has had a significant amount of pain and we will proceed with  the surgery.                                               Dineen Kid Rana Snare, M.D.    DCL/MEDQ  D:  11/14/2003  T:  11/14/2003  Job:  [id number]

## 2011-01-09 NOTE — Discharge Summary (Signed)
NAMEJOE, TANNEY               ACCOUNT NO.:  [id number]   MEDICAL RECORD NO.:  [id number]          PATIENT TYPE:  INP   LOCATION:  3714                         FACILITY:  [facility]   PHYSICIAN:  Theressa Millard, M.D.    DATE OF BIRTH:  August 07, 1966   DATE OF ADMISSION:  11/17/2005  DATE OF DISCHARGE:  11/18/2005                                 DISCHARGE SUMMARY   OBSERVATION DIAGNOSIS:  Chest pain.   DISCHARGE DIAGNOSES  1.  Chest pain, myocardial infarction ruled out, doubt coronary etiology.  2.  Anxiety.  3.  Diabetes mellitus.   The patient is a 45 year old black female who was brought in for observation  overnight for chest pain.  She was admitted approximately 11 p.m. on the  night of November 17, 2005.   On the bases of serial enzymes and EKGs, a myocardial infarction was ruled  out.  She was seen in consultation by Corky Crafts, MD, who thought  it was unlikely the patient was suffering from coronary disease.  He  recommended a stress Cardiolite but this could not be done during the  hospitalization, so she was discharged home to have the stress Cardiolite  later in our office.   DISCHARGE MEDICATIONS:  1.  Lexapro 10 mg daily.  2.  Glucophage XR 500 mg b.i.d.  3.  Byetta 5 mcg subcu b.i.d.   She will make arrangements to be seen in follow-up for the Cardiolite as  well as with Lavinia Sharps, N.P., in our office.  At that time, she will  also set up to see one of the physicians in the office.   DIET:  Modified carbohydrate, no added salt.   ACTIVITY:  As tolerated.      Theressa Millard, M.D.  Electronically Signed     JO/MEDQ  D:  11/19/2005  T:  11/20/2005  Job:  [id number]

## 2011-01-09 NOTE — Discharge Summary (Signed)
   NAMEMAKEILA, Isabel Berger                         ACCOUNT NO.:  [id number]   MEDICAL RECORD NO.:  [id number]                   PATIENT TYPE:  OBV   LOCATION:  9312                                 FACILITY:  [facility]   PHYSICIAN:  Dineen Kid. Rana Snare, M.D.                 DATE OF BIRTH:  1965/09/03   DATE OF ADMISSION:  05/24/2003  DATE OF DISCHARGE:                                 DISCHARGE SUMMARY   HISTORY OF PRESENT ILLNESS:  [name] is a 45 year old G2, P2 with pelvic  pain, menometrorrhagia, and a recent ovarian cystectomy last summer by  another physician.  She continues to have pain with intercourse and pain  during her periods.  Is now bleeding every other week.  Ultrasound a  sonohysterogram.  Shows a small mass in the endometrium consistent with a  submucosal fibroid and several other small fibroids.  She desires definitive  surgical evaluation and treatment and requests hysterectomy.  Risks and  benefits were discussed.  Informed consent was obtained.   HOSPITAL COURSE:  The patient was admitted to same day surgery.  Underwent a  laparoscopic assisted vaginal hysterectomy.  The findings at the time of  surgery were adhesions from the right ovary to the uterus.  The procedure  was otherwise uncomplicated.  Estimated blood loss during the procedure was  200 mL.  Her postoperative care was unremarkable with good return of bowel  function and she was ambulating, tolerating a regular diet on postoperative  day #1.  Her postoperative hemoglobin was 10.8 and she was discharged home  in stable condition.   DISPOSITION:  The patient will follow up in the office in two weeks.  Sent  home with a routine instruction sheet for hysterectomy.  Told to return for  increased pain, fever, or bleeding.  Given a prescription for Tylox #30.                                               Dineen Kid Rana Snare, M.D.    DCL/MEDQ  D:  05/25/2003  T:  05/25/2003  Job:  [id number]

## 2011-01-09 NOTE — Op Note (Signed)
[facility]  Patient:    Isabel Berger, Isabel Berger                      MRN: [id number] Proc. Date: 02/13/00 Adm. Date:  [id number] Attending:  Deniece Ree                           Operative Report  PREOPERATIVE DIAGNOSIS:       Multiparity, desirous of permanent sterilization.  POSTOPERATIVE DIAGNOSIS:      Multiparity, desirous of permanent sterilization.  OPERATION:                    Laparoscopic bilateral tubal cauterization with tubal resection.  SURGEON:                      Deniece Ree, M.D.  ANESTHESIA:                   General.  ESTIMATED BLOOD LOSS:         Less than 25 cc.  COMPLICATIONS:                None.  DISPOSITION:                  The patient tolerated the procedure well and returned to the recovery room in satisfactory condition.  DESCRIPTION OF PROCEDURE:     The patient was taken to the operating room, prepped and draped in the usual fashion for laparoscopic surgery.  A speculum was placed in the vagina, following which the anterior lip of the cervix was grasped with a Christella Hartigan tenaculum.  The acorn uterine manipulating cannula was then put into place.  A subumbilical incision was then made, following which the Veress needle was introduced through this incision and approximately 4 L of carbon dioxide was then infused without difficulty.  The laparoscopic trocar was placed through this incision, following which the laparoscope was placed through the sleeve.  Visualization of the pelvic organs came into view. The uterus, tubes and ovaries all appeared to be within normal limits.  The right tube was then grasped 5 mm proximal to the right cornu and cauterized for an approximate 3-4 cm length.  This was done likewise on the left side, following which both cauterized segments of tube were then cut in two locations.  Hemostasis remained present.  At this point, the procedure was then terminated, allowing the carbon dioxide  to escape from the abdominal cavity, which it did without any problems.  The incision was then closed, first with a deep interrupted stitch, followed by a subcuticular stitch of 4-0 Vicryl.  The procedure was terminated.  The patient tolerated the procedure well and returned to the recovery room in satisfactory condition.  The patient is to be discharged when fully alert.  She has been instructed on the possible complications and care following this type of surgery.  She has been told to return to my office in four weeks for follow-up evaluation or to call me prior to that time should any problems arise. DD:  02/13/00 TD:  02/16/00 Job: [id number]

## 2011-01-09 NOTE — Op Note (Signed)
NAMEMAYLINE, DRAGON                         ACCOUNT NO.:  [id number]   MEDICAL RECORD NO.:  [id number]                   PATIENT TYPE:  OBV   LOCATION:  9312                                 FACILITY:  [facility]   PHYSICIAN:  Dineen Kid. Rana Snare, M.D.                 DATE OF BIRTH:  1966/02/09   DATE OF PROCEDURE:  05/24/2003  DATE OF DISCHARGE:                                 OPERATIVE REPORT   PREOPERATIVE DIAGNOSES:  1. Dysmenorrhea.  2. Dyspareunia.  3. Fibroids.  4. Menomenorrhagia.   POSTOPERATIVE DIAGNOSES:  1. Dysmenorrhea.  2. Dyspareunia.  3. Fibroids.  4. Menomenorrhagia.   PROCEDURE:  Laparoscopically assisted vaginal hysterectomy.   SURGEON:  Dineen Kid. Rana Snare, M.D.   ASSISTANT:  Raynald Kemp, M.D.   ANESTHESIA:  General endotracheal anesthesia.   INDICATIONS FOR PROCEDURE:  Ms. Nedd is a 45 year old G2, P2 referred for  evaluation of pelvic pain and discomfort and menomenorrhagia, previously had  a right ovarian cystectomy this last summer.  Continues to have pain with  intercourse and during her period.  Is now bleeding every other week.  She  underwent sonohistogram which shows a small mass near the endometrium  consistent with submucosal fibroid versus several other small densities  consistent with fibroids.  The patient desires definitive surgical treatment  and requests hysterectomy.  Risks and benefits were discussed at length.  Informed consent was obtained.   FINDINGS:  The findings at the time of surgery were mildly enlarged uterus.  Right ovary was densely adhesed to the right side of the uterine wall,  normal-appearing appendix and liver.  Left tube and ovary were surgically  absent.   DESCRIPTION OF PROCEDURE:  After adequate analgesia, the patient was placed  in the dorsal lithotomy position.  She was sterilely prepped and draped.  Bladder was sterilely drained.  Tenaculum was placed in the anterior lip of  the cervix.  A 1 cm infraumbilical skin  incision was made.  A Veress needle  was inserted.  The abdomen was insufflated with dullness to percussion.  An  11 mm trocar was inserted.  A 5 mm trocar was inserted to the left of the  midline two fingerbreadths above the pubic symphysis under direct  visualization.  After careful examination of the pelvis, a gyrus tripolar  ligature was used to ligate across the left round ligament to the bladder  flap.  Lysis of adhesions was also carried out using the tripolar removing  the right ovary from the uterus and dissecting across the utero-ovarian  ligament down across the round ligament and just inferior to the round  ligament with good hemostasis and the right ovary and tube falling to the  pelvic sidewall of the ovarian fossa.  The bladder flap was elevated and  gyrus was used to create a small window and the vesicouterine flap.  Abdomen  was then desufflated. Legs repositioned.  Weighted speculum was placed.  A  posterior colpotomy was performed.  Cervix was circumscribed with Bovie  cautery.  A LigaSure ligator was used to ligate the uterosacral ligaments  bilaterally, cardinal ligaments bilaterally, the bladder pillars.  The  bladder was dissected off the anterior surface of the cervix and a Deaver  retractor was placed underneath the bladder.  The uterine vasculature was  then coagulated up to the inferior portion of the broad ligaments using the  LigaSure and Mayo scissors.  The thyroid tenaculum was used to grasp the  fundus of the uterus, delivered into the introitus and the remaining portion  of the pedicles were clamped and cut.  The uterus was removed.  The  uterosacral ligaments were identified and ligated with 0 Monocryl suture.  Posterior peritoneum was then closed in a pursestring fashion.  The vaginal  mucosa was closed in a vertical fashion using sutures of 0 Monocryl.  After  good approximation and good support was noted, abdomen was reinsufflated,  peritoneal edges were  coagulated with gyrus ligature and after hemostasis  was assured, the abdomen was desufflated.  The infraumbilical skin incision  was closed with 0 Monocryl and a 3-0 Vicryl Rapide.  The 5 mm site was  closed with 3-0 Vicryl Rapide.  Incisions were injected with 0.25% Marcaine  10 mL.  The patient was stable and transferred to the recovery room.  Sponge  and instrument counts normal x3.  The patient received 1 g of cefotetan  preoperatively.  Estimated blood loss was 200 mL.                                               Dineen Kid Rana Snare, M.D.    DCL/MEDQ  D:  05/24/2003  T:  05/24/2003  Job:  [id number]

## 2011-01-09 NOTE — H&P (Signed)
NAMEARIANA, JUUL NO.:  [id number]   MEDICAL RECORD NO.:  [id number]          PATIENT TYPE:  EMS   LOCATION:  MAJO                         FACILITY:  [facility]   PHYSICIAN:  Theressa Millard, M.D.    DATE OF BIRTH:  1966/02/14   DATE OF ADMISSION:  11/17/2005  DATE OF DISCHARGE:                                HISTORY & PHYSICAL   HISTORY OF PRESENT ILLNESS:  Isabel Berger is a 45 year old black female  brought in for observation secondary to chest pain.  She has a history of  panic anxiety first manifested by anxiousness and dyspnea about 2 years ago  when her father died.  Over the last 2 weeks, her 71 year old son has been  having problems in school, and her anxiety has increased dramatically.  Isabel Berger, nurse practitioner in our office, started her on Lexapro 10 mg 2  weeks ago.  The patient has been feeling better.   Tonight at approximately 6 p.m., she was helping her 54 year old son with  his homework.  She had the sudden onset of severe pain in the chest that  radiated into the left arm.  She rated it as a 12/10.  The pain was  constant.  She had numbness in her left arm, and she began to sweat.  She  finally called EMTs at approximately 8 p.m.  In the emergency department,  she was given nitroglycerin x2 and the pain improved.  It is now gone.   PAST MEDICAL HISTORY:  1.  Diabetes x2 years.  2.  Anxiety and panic disorder.   SURGERY:  Total abdominal hysterectomy and bilateral oophorectomy.   ALLERGIES:  No known drug allergies.   MEDICATIONS:  1.  Lexapro 10 mg daily.  2.  Glucophage XR 500 mg b.i.d.   SOCIAL HISTORY:  Cigarettes none.  Alcohol none.  She works for [employer].  She has 2 sons, ages 10 and 41.  She is a single  mother.  Her 82 year old son has had problems in school lately and was  suspended recently.   FAMILY HISTORY:  Father died of lung cancer.  Mother, age 64, alive and  well.   REVIEW OF SYSTEMS:   All other systems were negative.   PHYSICAL EXAMINATION:  VITAL SIGNS:  Blood pressure 130/80, pulse 75,  respiratory rate 20, oxygen saturation 97%.  HEENT:  Eyes - pupils equal, round and reactive to light.  Extraocular  movements are intact.  Funduscopic exam was normal.  Ears were normal.  Nose  and throat were unremarkable.  NECK:  Supple.  Thyroid is not enlarged or tender.  CHEST:  Clear to auscultation and percussion.  CARDIAC:  A normal S1 and S2 without S3, S4, murmur, rub, or click.  ABDOMEN:  Soft and nontender.  EXTREMITIES:  Without clubbing, cyanosis, or edema.  There is no calf  tenderness.   LABORATORY DATA:  Troponin is negative.  Myoglobin is negative.  Electrolytes are normal.  Glucose is 91.  EKG shows normal sinus rhythm with  normal axis and intervals.  There was no  acute changes.   IMPRESSION:  1.  Chest pain.  This severe pain with sudden onset is somewhat atypical for      coronary disease; however, she has risk factors, and the duration of the      pain and relief with nitroglycerin make one at least partially      suspicious for coronary disease.  Will need to rule out a myocardial      infarction and probably do a non-invasive work-up.  I do suspect this is      stress related.  2.  Diabetes mellitus.  3.  Anxiety and panic.   Will obtain a cardiology consultation in the morning.      Theressa Millard, M.D.  Electronically Signed     JO/MEDQ  D:  11/17/2005  T:  11/18/2005  Job:  [id number]

## 2011-02-04 ENCOUNTER — Ambulatory Visit: Payer: Medicaid Other | Admitting: Family Medicine

## 2011-04-23 ENCOUNTER — Ambulatory Visit: Payer: Medicaid Other | Admitting: Family Medicine

## 2011-10-19 ENCOUNTER — Encounter (HOSPITAL_COMMUNITY): Payer: Self-pay | Admitting: *Deleted

## 2011-10-19 ENCOUNTER — Emergency Department (INDEPENDENT_AMBULATORY_CARE_PROVIDER_SITE_OTHER)
Admission: EM | Admit: 2011-10-19 | Discharge: 2011-10-19 | Disposition: A | Discharge status: Home / Self Care | Payer: Self-pay | Source: Home / Self Care | Attending: Family Medicine | Admitting: Family Medicine

## 2011-10-19 DIAGNOSIS — M461 Sacroiliitis, not elsewhere classified: Secondary | ICD-10-CM

## 2011-10-19 DIAGNOSIS — M549 Dorsalgia, unspecified: Secondary | ICD-10-CM

## 2011-10-19 MED ORDER — PREDNISONE (PAK) 10 MG PO TABS: ORAL_TABLET | ORAL | Status: DC

## 2011-10-19 MED ORDER — HYDROCODONE-ACETAMINOPHEN 5-325 MG PO TABS: ORAL_TABLET | ORAL | Status: AC

## 2011-10-19 MED ORDER — CYCLOBENZAPRINE HCL 5 MG PO TABS: 5.0000 mg | ORAL_TABLET | Freq: Three times a day (TID) | ORAL | Status: AC | PRN

## 2011-10-19 MED ORDER — PREDNISONE (PAK) 10 MG PO TABS: ORAL_TABLET | ORAL | Status: AC

## 2011-10-19 MED ORDER — CYCLOBENZAPRINE HCL 5 MG PO TABS: 5.0000 mg | ORAL_TABLET | Freq: Three times a day (TID) | ORAL | Status: DC | PRN

## 2011-10-19 NOTE — Discharge Instructions (Signed)
Take medications as directed. Take the steroids for full course as directed; these can raise your blood sugar, so monitor. Use the cyclobenzaprine (Flexeril, muscle relaxer) at night, along with mild heat (heating pad, warm baths, etc) for 10 to 15 minutes, two to three times daily, as needed and as tolerated, taking care to not burn the skin. Return to care should your symptoms not improve, or worsen in any way, such as numbness, weakness, or tingling, or inability to control urine or bowel movements.

## 2011-10-19 NOTE — ED Notes (Addendum)
Reports reaching up into son's closet Sunday afternoon when she felt sudden onset left low back and left hip "tingling pain".  Has rested and applied heat; took IBU 800mg  on 2 occasions without any relief.  Denies and pain into lower extremity or buttock.  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

## 2011-10-19 NOTE — ED Notes (Signed)
Reports has been off all meds x approx 1 yr due to no insurance; will have permanent insurance starting next week - has appt set up w/ PCP to restart meds.

## 2011-10-19 NOTE — ED Provider Notes (Signed)
History     CSN: [id number]  Arrival date & time 10/19/11  0272   First MD Initiated Contact with Patient 10/19/11 1856      Chief Complaint  Patient presents with  . Back Pain  . Hip Pain    (Consider location/radiation/quality/duration/timing/severity/associated sxs/prior treatment) HPI Comments: Isabel Berger presents for evaluation of persistent left lower back pain since Sunday. She reports that she and her family just moved here and are currently in the process of unpacking their belongings. She was assisting with her children when she reached up into the closet, felt a pop, and has been experiencing worsening left-sided lower back pain. She tried heat and ibuprofen with some relief. She denies any numbness, tingling, or radiation to her legs.  Patient is a 46 y.o. female presenting with back pain. The history is provided by the patient.  Back Pain  This is a new problem. The current episode started yesterday. The problem has not changed since onset.The pain is associated with no known injury. The pain is present in the sacro-iliac joint and lumbar spine. The quality of the pain is described as stabbing and aching. The pain does not radiate. The pain is moderate. The symptoms are aggravated by bending, twisting and certain positions. Pertinent negatives include no numbness, no bowel incontinence, no perianal numbness, no bladder incontinence, no leg pain, no paresthesias, no paresis, no tingling and no weakness. She has tried heat and NSAIDs for the symptoms.    Past Medical History  Diagnosis Date  . Hypertension   . Depression   . Diabetes mellitus     Past Surgical History  Procedure Date  . Abdominal hysterectomy   . Foot surgery 2008 x 3    Dr Aldean Baker     Family History  Problem Relation Age of Onset  . Diabetes Mother   . Hypertension Mother     History  Substance Use Topics  . Smoking status: Never Smoker   . Smokeless tobacco: Never Used  . Alcohol Use: No     OB History    Grav Para Term Preterm Abortions TAB SAB Ect Mult Living                  Review of Systems  Constitutional: Negative.   HENT: Negative.   Eyes: Negative.   Respiratory: Negative.   Cardiovascular: Negative.   Gastrointestinal: Negative.  Negative for bowel incontinence.  Genitourinary: Negative.  Negative for bladder incontinence.  Musculoskeletal: Positive for back pain.  Skin: Negative.   Neurological: Negative.  Negative for tingling, weakness, numbness and paresthesias.    Allergies  Review of patient's allergies indicates no known allergies.  Home Medications   Current Outpatient Rx  Name Route Sig Dispense Refill  . PRODIGY BLOOD GLUCOSE MONITOR W/DEVICE KIT  Check your blood sugar 3 times a day.     . CYCLOBENZAPRINE HCL 5 MG PO TABS Oral Take 1 tablet (5 mg total) by mouth 3 (three) times daily as needed for muscle spasms (Take one tablet at night, and up to three times daily if not too drowsy; may take two tablets at a time if tolerated). 15 tablet 0  . EXENATIDE 5 MCG/0.02ML [place]ard SOLN  Inject subcutaneously two times a day for diabetes. Dispense for 1 month     . FLUOXETINE HCL 10 MG PO CAPS Oral Take 10 mg by mouth daily.      Marland Kitchen GLUCOSE BLOOD VI STRP  Test blood sugar 3 times a day.     Marland Kitchen  HYDROCODONE-ACETAMINOPHEN 5-325 MG PO TABS  Take one to two tablets every 4 to 6 hours as needed for pain 20 tablet 0  . IBUPROFEN 800 MG PO TABS Oral Take 800 mg by mouth every 8 (eight) hours as needed. For back and foot pain     . INSULIN PEN NEEDLE 31G X 8 MM MISC  dispense quanity sufficient for twice a day injections     . LISINOPRIL-HYDROCHLOROTHIAZIDE 20-12.5 MG PO TABS Oral Take 1 tablet by mouth daily. For blood pressure     . METFORMIN HCL 500 MG PO TABS Oral Take 500 mg by mouth 2 (two) times daily. For diabetes     . PREDNISONE (PAK) 10 MG PO TABS  Take 6 tablets on day 1, 5 tablets on day 2, 4 tablets on day 3, 3 tablets on day 4, 2 tablets on day 5, 1  tablet on day 6 21 tablet 0  . VITAMIN D (ERGOCALCIFEROL) 50000 UNITS PO CAPS Oral Take 50,000 Units by mouth every 7 (seven) days. For 8 weeks       BP 145/99  Pulse 73  Temp(Src) 98.2 F (36.8 C) (Oral)  Resp 18  SpO2 100%  Physical Exam  Nursing note and vitals reviewed. Constitutional: She is oriented to person, place, and time. She appears well-developed and well-nourished.  HENT:  Head: Normocephalic and atraumatic.  Eyes: EOM are normal.  Neck: Normal range of motion.  Pulmonary/Chest: Effort normal.  Musculoskeletal: Normal range of motion.       Lumbar back: She exhibits no tenderness and no bony tenderness.       Back: negative straight leg raise bilaterally, positive Fabers bilaterally, no pain with internal or external rotation bilaterally, full flexion, extension, abduction, and adduction; 5/5 strength; tenderness to palpation over LEFT SI joint  Neurological: She is alert and oriented to person, place, and time.  Skin: Skin is warm and dry.  Psychiatric: Her behavior is normal.    ED Course  Procedures (including critical care time)  Labs Reviewed - No data to display No results found.   1. Sacroiliitis   2. Back pain       MDM  Given rx for prednisone dosepak, hydrocodone PRN, and cyclobenzaprine PRN/QHS        Richardo Priest, MD 10/19/11 2236

## 2011-11-09 ENCOUNTER — Ambulatory Visit: Payer: Self-pay | Admitting: Family Medicine

## 2013-03-25 ENCOUNTER — Emergency Department (INDEPENDENT_AMBULATORY_CARE_PROVIDER_SITE_OTHER)
Admission: EM | Admit: 2013-03-25 | Discharge: 2013-03-25 | Disposition: A | Discharge status: Home / Self Care | Payer: Self-pay | Source: Home / Self Care

## 2013-03-25 ENCOUNTER — Encounter (HOSPITAL_COMMUNITY): Payer: Self-pay

## 2013-03-25 DIAGNOSIS — J309 Allergic rhinitis, unspecified: Secondary | ICD-10-CM

## 2013-03-25 DIAGNOSIS — R0789 Other chest pain: Secondary | ICD-10-CM

## 2013-03-25 DIAGNOSIS — R071 Chest pain on breathing: Secondary | ICD-10-CM

## 2013-03-25 MED ORDER — FLUTICASONE PROPIONATE 50 MCG/ACT NA SUSP: 2.0000 | Freq: Every day | NASAL | Status: DC

## 2013-03-25 MED ORDER — PHENYLEPHRINE-CHLORPHEN-DM 10-4-12.5 MG/5ML PO LIQD: 5.0000 mL | ORAL | Status: DC | PRN

## 2013-03-25 NOTE — ED Provider Notes (Signed)
CSN: [id number]     Arrival date & time 03/25/13  1114 History     None    Chief Complaint  Patient presents with  . Medication Management   (Consider location/radiation/quality/duration/timing/severity/associated sxs/prior Treatment) HPI Comments: For one week this 47 year old female is complaining of PND, scratchy throat, pressure behind the eyes, ear discomfort, cough, anterior chest wall pain associated with cough only and chronic sciatica pain.   Past Medical History  Diagnosis Date  . Hypertension   . Depression   . Diabetes mellitus    Past Surgical History  Procedure Laterality Date  . Abdominal hysterectomy    . Foot surgery  2008 x 3    Dr Aldean Baker    Family History  Problem Relation Age of Onset  . Diabetes Mother   . Hypertension Mother    History  Substance Use Topics  . Smoking status: Never Smoker   . Smokeless tobacco: Never Used  . Alcohol Use: No   OB History   Grav Para Term Preterm Abortions TAB SAB Ect Mult Living                 Review of Systems  Constitutional: Negative for fever, chills and appetite change.  HENT: Positive for ear pain, congestion, sore throat, rhinorrhea, postnasal drip and sinus pressure. Negative for hearing loss, facial swelling and neck pain.   Respiratory: Positive for cough and chest tightness. Negative for shortness of breath and wheezing.   Gastrointestinal: Negative.   Neurological: Negative.     Allergies  Review of patient's allergies indicates no known allergies.  Home Medications   Current Outpatient Rx  Name  Route  Sig  Dispense  Refill  . Blood Glucose Monitoring Suppl (PRODIGY BLOOD GLUCOSE MONITOR) W/DEVICE KIT      Check your blood sugar 3 times a day.          . exenatide (BYETTA 5 MCG PEN) 5 MCG/0.02ML SOLN      Inject subcutaneously two times a day for diabetes. Dispense for 1 month          . FLUoxetine (PROZAC) 10 MG capsule   Oral   Take 10 mg by mouth daily.           .  fluticasone (FLONASE) 50 MCG/ACT nasal spray   Nasal   Place 2 sprays into the nose daily.   16 g   2   . glucose blood (PRODIGY TEST) test strip      Test blood sugar 3 times a day.          . ibuprofen (ADVIL,MOTRIN) 800 MG tablet   Oral   Take 800 mg by mouth every 8 (eight) hours as needed. For back and foot pain          . Insulin Pen Needle (B-D ULTRAFINE III SHORT PEN) 31G X 8 MM MISC      dispense quanity sufficient for twice a day injections          . lisinopril-hydrochlorothiazide (PRINZIDE,ZESTORETIC) 20-12.5 MG per tablet   Oral   Take 1 tablet by mouth daily. For blood pressure          . metFORMIN (GLUCOPHAGE) 500 MG tablet   Oral   Take 500 mg by mouth 2 (two) times daily. For diabetes          . Phenylephrine-Chlorphen-DM 05-28-11.5 MG/5ML LIQD   Oral   Take 5 mLs by mouth every 4 (four) hours as needed. May cause  drowsiness   120 mL   0   . Vitamin D, Ergocalciferol, (DRISDOL) 50000 UNIT CAPS   Oral   Take 50,000 Units by mouth every 7 (seven) days. For 8 weeks           BP 143/86  Pulse 80  Temp(Src) 99.1 F (37.3 C) (Oral)  Resp 20  SpO2 98% Physical Exam  Nursing note and vitals reviewed. Constitutional: She is oriented to person, place, and time. She appears well-developed and well-nourished. No distress.  HENT:  Mouth/Throat: No oropharyngeal exudate.  The bi lateral TMs are normal Oropharynx with erythema, cobblestoning and clear PND.  Eyes: Conjunctivae and EOM are normal.  Neck: Normal range of motion. Neck supple.  Cardiovascular: Normal rate, regular rhythm and normal heart sounds.   Pulmonary/Chest: Effort normal and breath sounds normal. No respiratory distress. She has no wheezes. She has no rales.  Positive reproducible tenderness over the anterior chest wall in the bilateral parasternal areas.  Lymphadenopathy:    She has no cervical adenopathy.  Neurological: She is alert and oriented to person, place, and time.   Skin: Skin is warm and dry.  Psychiatric: She has a normal mood and affect.    ED Course   Procedures (including critical care time)  Labs Reviewed - No data to display No results found. 1. Allergic rhinitis due to allergen   2. Chest wall pain     MDM  Norel Cs as directed Fluticasone NS Plenty of fluids.    Hayden Rasmussen, NP 03/25/13 1231

## 2013-03-25 NOTE — ED Notes (Signed)
Relates multiple issues; has not taken any of her medication for over a year. C/o her eyes feel like they are going to come out of her head, HA, pain in back, chest, cough, sniffles

## 2013-03-27 NOTE — ED Provider Notes (Signed)
Medical screening examination/treatment/procedure(s) were performed by resident physician or non-physician practitioner and as supervising physician I was immediately available for consultation/collaboration.   Lavern Crimi DOUGLAS MD.   Lisaann Atha D Kemauri Musa, MD 03/27/13 1655 

## 2013-04-18 ENCOUNTER — Ambulatory Visit (INDEPENDENT_AMBULATORY_CARE_PROVIDER_SITE_OTHER): Payer: Self-pay | Admitting: Family Medicine

## 2013-04-18 ENCOUNTER — Encounter: Payer: Self-pay | Admitting: Family Medicine

## 2013-04-18 VITALS — BP 142/88 | HR 83 | Temp 98.4°F | Wt 237.0 lb

## 2013-04-18 DIAGNOSIS — I10 Essential (primary) hypertension: Secondary | ICD-10-CM

## 2013-04-18 DIAGNOSIS — J309 Allergic rhinitis, unspecified: Secondary | ICD-10-CM

## 2013-04-18 DIAGNOSIS — F341 Dysthymic disorder: Secondary | ICD-10-CM

## 2013-04-18 DIAGNOSIS — E119 Type 2 diabetes mellitus without complications: Secondary | ICD-10-CM

## 2013-04-18 LAB — BASIC METABOLIC PANEL
CO2: 28 mEq/L (ref 19–32)
Calcium: 9.8 mg/dL (ref 8.4–10.5)
Chloride: 106 mEq/L (ref 96–112)
Glucose, Bld: 82 mg/dL (ref 70–99)
Sodium: 140 mEq/L (ref 135–145)

## 2013-04-18 LAB — LIPID PANEL
LDL Cholesterol: 97 mg/dL (ref 0–99)
Total CHOL/HDL Ratio: 4.3 Ratio
VLDL: 25 mg/dL (ref 0–40)

## 2013-04-18 MED ORDER — FLUOXETINE HCL 10 MG PO CAPS: 10.0000 mg | ORAL_CAPSULE | Freq: Every day | ORAL | Status: DC

## 2013-04-18 MED ORDER — GABAPENTIN 100 MG PO CAPS: 100.0000 mg | ORAL_CAPSULE | Freq: Three times a day (TID) | ORAL | Status: DC

## 2013-04-18 MED ORDER — CETIRIZINE HCL 10 MG PO TABS: 10.0000 mg | ORAL_TABLET | Freq: Every day | ORAL | Status: DC

## 2013-04-18 NOTE — Progress Notes (Signed)
  Subjective:    Patient ID: Isabel Berger, female    DOB: [birth year], 47 y.o.   MRN: [id number]  HPI Patient here for follow-up of diabetes and hypertension. She has been off all medications for about 3 years. During that time she has been to urgent care for allergies and reports high blood pressure there and at the pharmacy. 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  She also reports high stress levels at work, working 14 hour days, sometimes longer and getting as little as 3 hours of sleep on a regular basis.    Review of Systems  Constitutional: Negative.   HENT: Positive for rhinorrhea and sneezing.   Eyes: Positive for redness and itching. Negative for photophobia, pain, discharge and visual disturbance.  Respiratory: Negative.   Cardiovascular: Negative.   Gastrointestinal: Negative.   Endocrine: Negative.   Neurological: Positive for dizziness, light-headedness and numbness. Negative for syncope and headaches.       Endorses numbness/tingling in feet bilaterally with pain shooting down back of legs  Psychiatric/Behavioral: Positive for sleep disturbance. Negative for dysphoric mood, decreased concentration and agitation. The patient is nervous/anxious.        Objective:   Physical Exam  Constitutional: She is oriented to person, place, and time. She appears well-developed and well-nourished. No distress.  HENT:  Head: Normocephalic and atraumatic.  Right Ear: External ear normal.  Left Ear: External ear normal.  Nose: Nose normal.  Eyes: Conjunctivae are normal. Right eye exhibits no discharge. Left eye exhibits no discharge. No scleral icterus.  Cardiovascular: Normal rate, regular rhythm, normal heart sounds and intact distal pulses.   No murmur heard. Pulmonary/Chest: Effort normal and  breath sounds normal. No respiratory distress.  Neurological: She is alert and oriented to person, place, and time. A sensory deficit is present.  Diabetic foot exam: no lesions, scars on superior aspect from prior surgeries, decreased sensation on inferior surfaces bilaterally, unable to detect monofilament anywhere with any pressure  Skin: Skin is warm and dry. She is not diaphoretic.          Assessment & Plan:  See problem list

## 2013-04-18 NOTE — Assessment & Plan Note (Addendum)
142/88 today but reports frequently up to 160s at pharmacy - recheck bmet then restart lisinopril/hctz if creat not elevated - creat 0.78, lisinopril/hctz sent to pharmacy

## 2013-04-18 NOTE — Patient Instructions (Addendum)
Today we discussed your diabetes, blood pressure, stress, and allergies.  For your diabetes, I will not restart your medications until we know what your blood sugars are doing throughout the day. Please check them once per day at different times and record them for [place] to discuss next time. I am starting a medicine, neurontin, for your foot pain that may be caused by diabetes. Please make an appointment to see your eye doctor.  For your blood pressures I will likely restart your lisinopril/hctz but I will wait to see how your kidney function is before doing this.  I am prescribing zyrtec for your allergies and restarting your prozac for stress/anxiety.  Please come back in 3-4 weeks with your blood sugar log to discuss this further.  Thank you,  Dr. Richarda Blade

## 2013-04-18 NOTE — Assessment & Plan Note (Signed)
Denies anxiety/depression now but endorses stress around work/boss causing "tension" and decreased sleep/relaxation - restart prozac

## 2013-04-18 NOTE — Assessment & Plan Note (Addendum)
A1C 6.3 Off all medications, has neuropathy vs. Sequelae of previous surgeries - pt instructed to record CBGs at various times of day until next appointment to discuss - will hold on restarting dm meds until that time if at all - trial of neurontin - check lipid panel today - refer to nutrition for dm and obesity

## 2013-04-18 NOTE — Assessment & Plan Note (Signed)
Allergies causing congestion/eye sx daily - start zyrtec

## 2013-04-20 MED ORDER — LISINOPRIL-HYDROCHLOROTHIAZIDE 20-12.5 MG PO TABS: 1.0000 | ORAL_TABLET | Freq: Every day | ORAL | Status: DC

## 2013-04-20 NOTE — Addendum Note (Signed)
Addended by: Abram Sander on: 04/20/2013 09:26 AM   Modules accepted: Orders

## 2013-04-21 ENCOUNTER — Telehealth: Payer: Self-pay | Admitting: Family Medicine

## 2013-04-21 NOTE — Telephone Encounter (Signed)
Message copied by Barnie Alderman on Fri Apr 21, 2013  4:14 PM ------      Message from: Abram Sander      Created: Thu Apr 20, 2013  9:28 AM       Please call to advise patient that kidney function was good so her blood pressure medication (lisinopril/hctz) has been sent to the pharmacy for her to restart. In addition her cholesterol was normal. Thank you! ------

## 2013-04-21 NOTE — Telephone Encounter (Signed)
Advised pt of results and to pick up med at pharmacy.

## 2013-05-09 ENCOUNTER — Encounter: Payer: Self-pay | Admitting: Family Medicine

## 2013-05-09 ENCOUNTER — Ambulatory Visit (INDEPENDENT_AMBULATORY_CARE_PROVIDER_SITE_OTHER): Payer: Self-pay | Admitting: Family Medicine

## 2013-05-09 VITALS — BP 125/86 | HR 76 | Ht 67.0 in | Wt 239.9 lb

## 2013-05-09 DIAGNOSIS — M65839 Other synovitis and tenosynovitis, unspecified forearm: Secondary | ICD-10-CM

## 2013-05-09 DIAGNOSIS — M659 Synovitis and tenosynovitis, unspecified: Secondary | ICD-10-CM | POA: Insufficient documentation

## 2013-05-09 DIAGNOSIS — M79641 Pain in right hand: Secondary | ICD-10-CM

## 2013-05-09 DIAGNOSIS — M79609 Pain in unspecified limb: Secondary | ICD-10-CM

## 2013-05-09 DIAGNOSIS — M65949 Unspecified synovitis and tenosynovitis, unspecified hand: Secondary | ICD-10-CM

## 2013-05-09 MED ORDER — DICLOFENAC SODIUM 75 MG PO TBEC: 75.0000 mg | DELAYED_RELEASE_TABLET | Freq: Two times a day (BID) | ORAL | Status: DC

## 2013-05-09 NOTE — Progress Notes (Signed)
Subjective:     Patient ID: Isabel Berger, female   DOB: Aug 07, 1966, 47 y.o.   MRN: [id number]  HPI  DEQUERVAIN'S TENOSYNOVITIS Hx R-thumb pain with movements x 6 days, onset at work (denies injury or trauma), progressively worsening. No similar episodes in past. Denies any prior fracture / injury R-hand.  Pain is severe sharp located at base of thumb area of tendons, radiates down forearm. Worse with grasping, flexing thumb, carrying objects, fine motor movements.  Affect on patient - (R-hand dominant, but writes w/ Left hand) difficulty performing job, pain with excessive typing, driving.  Current treatment - Ibuprofen 500mg  intermittently (x 2 days) and Heat w/o relief   Social Hx - Never smoker. Works at [employer] PMH - DM w/ peripheral neuropathy (on gabapentin)  Review of Systems  See above HPI. Other ROS: Admits +numbness / tingling in bilateral LE. Denies any trauma, HA, rashes, GI bleed, blood in stool, abd pain, nausea/vomiting.     Objective:   Physical Exam  BP 125/86  Pulse 76  Ht 5\' 7"  (1.702 m)  Wt 239 lb 14.4 oz (108.818 kg)  BMI 37.56 kg/m2  General - very pleasant, mild anxiety related to R-hand pain but NAD HEENT - PERRL, MMM Heart - RRR, no murmurs Lungs - CTAB Abd - soft, non-tender, +BS MSK: R-hand: +Finklestein's test, +TTP EPB/APL region and thenar eminence, poor AROM due to pain. Negative Tinel's median n. Ext - no edema

## 2013-05-09 NOTE — Patient Instructions (Addendum)
Dear Isabel Berger, Thank you for coming in to clinic today. It was good to meet you!  Today we discussed your Right Thumb Pain. - As we discussed, your pain is likely from inflammation in one of your Thumb tendons (DeQuervain's Tenosynovitis). - To treat this I have sent a prescription for Diclofenac (anti-inflammatory med) to be taken regularly for the next 1-2 weeks (take with food to avoid stomach irritation). - We have given you a Thumb Splint, please wear this daily, especially when you are performing any straining activity using your Right hand. Be sure to take it off 2-3 times a day to allow your thumb to move some to work on range of motion exercises, because if it is completely immobile it may not heal quickly. Also, you can wear this overnight to help you sleep. - You may use some ice packs to help relieve the pain as well - If these treatments do not improve or resolve the pain within the next 2 weeks, please call to schedule a follow-up appointment. You may need an injection in the future, but we will need to re-evaluate you before that time  We started a new medication today to help your Thumb pain. Diclofenac 75 mg, please take 1 tablet 2 times a day (with meals) as we discussed.  Please schedule a follow-up appointment with Dr. Richarda Blade (or any available doctor) in 2 - 4 weeks if pain is not improving.  If you have any other questions or concerns, please feel free to call the clinic to contact me. You may also schedule an earlier appointment if necessary.  However, if your symptoms get significantly worse, please go to the Emergency Department to seek immediate medical attention.  Isabel Pilar, DO [facility]   De Quervain's Disease Isabel Berger disease is a condition often seen in racquet sports where there is a soreness (inflammation) in the cord like structures (tendons) which attach muscle to bone on the thumb side of the wrist. There may be a  tightening of the tissuesaround the tendons. This condition is often helped by giving up or modifying the activity which caused it. When conservative treatment does not help, surgery may be required. Conservative treatment could include changes in the activity which brought about the problem or made it worse. Anti-inflammatory medications and injections may be used to help decrease the inflammation and help with pain control. Your caregiver will help you determine which is best for you. DIAGNOSIS  Often the diagnosis (learning what is wrong) can be made by examination. Sometimes x-rays are required. HOME CARE INSTRUCTIONS   Apply ice to the sore area for 15-20 minutes, 3-4 times per day while awake. Put the ice in a plastic bag and place a towel between the bag of ice and your skin. This is especially helpful if it can be done after all activities involving the sore wrist.  Temporary splinting may help.  Only take over-the-counter or prescription medicines for pain, discomfort or fever as directed by your caregiver. SEEK MEDICAL CARE IF:   Pain relief is not obtained with medications, or if you have increasing pain and seem to be getting worse rather than better. MAKE SURE YOU:   Understand these instructions.  Will watch your condition.  Will get help right away if you are not doing well or get worse. Document Released: 05/05/2001 Document Revised: 11/02/2011 Document Reviewed: 08/10/2005 [facility] Patient Information 2014 [place], [place].

## 2013-05-09 NOTE — Assessment & Plan Note (Addendum)
Hx R-thumb pain with movements x 6 days, onset at work (denies injury or trauma), progressively worsening. No similar episodes in past. Denies any prior fracture / injury R-hand. Pain is severe sharp located at base of thumb area of EPB APL tendons, radiates down forearm. Worse with grasping, flexing thumb, carrying objects, fine motor movements. 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 Current treatment - Ibuprofen 500mg  intermittently (x 2 days) and Heat w/o relief. Exam: R-hand: +Finklestein's test, +TTP EPB/APL region and thenar eminence, poor AROM due to pain. Negative Tinel's median n.  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 Rest - patient willing to work light duty with restrictions (lifting, carrying, excessive typing / driving) x 2 weeks until seen back in clinic. Wrote work note 2. Diclofenac 75mg  BID w/ meals (#60, 0 refills) (no hx of GI bleed) - Stop Ibuprofen 3. Given R-thumb Spica Wrist Splint - advised to wear during work or strenuous activity. Take it off 2-3x daily for thumb ROM, may sleep with it on 4. Advised can use cold or heat as needed for pain. 5. RTC in 2 - 4 weeks if not improving.  Future consideration: 1. If not improving, may need prolonged rest vs steroid injection of primary affected tendon (EPB, APL)

## 2013-08-11 ENCOUNTER — Ambulatory Visit (INDEPENDENT_AMBULATORY_CARE_PROVIDER_SITE_OTHER): Payer: Self-pay | Admitting: Emergency Medicine

## 2013-08-11 ENCOUNTER — Encounter: Payer: Self-pay | Admitting: Emergency Medicine

## 2013-08-11 ENCOUNTER — Telehealth: Payer: Self-pay | Admitting: Emergency Medicine

## 2013-08-11 ENCOUNTER — Ambulatory Visit (HOSPITAL_COMMUNITY)
Admission: RE | Admit: 2013-08-11 | Discharge: 2013-08-11 | Disposition: A | Discharge status: Home / Self Care | Payer: Self-pay | Source: Ambulatory Visit | Attending: Family Medicine | Admitting: Family Medicine

## 2013-08-11 VITALS — BP 148/95 | HR 65 | Temp 97.5°F | Ht 67.0 in | Wt 243.5 lb

## 2013-08-11 DIAGNOSIS — R110 Nausea: Secondary | ICD-10-CM | POA: Insufficient documentation

## 2013-08-11 DIAGNOSIS — K7689 Other specified diseases of liver: Secondary | ICD-10-CM | POA: Insufficient documentation

## 2013-08-11 DIAGNOSIS — R109 Unspecified abdominal pain: Secondary | ICD-10-CM | POA: Insufficient documentation

## 2013-08-11 DIAGNOSIS — R10A2 Flank pain, left side: Secondary | ICD-10-CM | POA: Insufficient documentation

## 2013-08-11 DIAGNOSIS — R350 Frequency of micturition: Secondary | ICD-10-CM | POA: Insufficient documentation

## 2013-08-11 DIAGNOSIS — K5730 Diverticulosis of large intestine without perforation or abscess without bleeding: Secondary | ICD-10-CM | POA: Insufficient documentation

## 2013-08-11 LAB — POCT URINALYSIS DIPSTICK
Ketones, UA: NEGATIVE
Nitrite, UA: NEGATIVE
Protein, UA: NEGATIVE
Urobilinogen, UA: 0.2
pH, UA: 7

## 2013-08-11 LAB — POCT UA - MICROSCOPIC ONLY

## 2013-08-11 MED ORDER — POLYETHYLENE GLYCOL 3350 17 GM/SCOOP PO POWD: 17.0000 g | Freq: Two times a day (BID) | ORAL | Status: DC | PRN

## 2013-08-11 MED ORDER — OXYCODONE-ACETAMINOPHEN 5-325 MG PO TABS: 1.0000 | ORAL_TABLET | Freq: Three times a day (TID) | ORAL | Status: DC | PRN

## 2013-08-11 MED ORDER — MORPHINE SULFATE 10 MG/ML IJ SOLN
10.0000 mg | Freq: Once | INTRAMUSCULAR | Status: AC
  Administered 2013-08-11: 10 mg via INTRAMUSCULAR

## 2013-08-11 MED ORDER — KETOROLAC TROMETHAMINE 60 MG/2ML IM SOLN
60.0000 mg | Freq: Once | INTRAMUSCULAR | Status: AC
  Administered 2013-08-11: 60 mg via INTRAMUSCULAR

## 2013-08-11 NOTE — Patient Instructions (Signed)
I think you have a kidney stone. We are getting a CT scan to confirm.  Take the percocet 1-2 pills every 4 hours as needed for pain. Also take ibuprofen 600mg  every 8 hours.  I will call you with the results of the CT scan.

## 2013-08-11 NOTE — Assessment & Plan Note (Signed)
Suspect renal stone given acute onset. Diverticulitis is also in the differential, but this is less likely. She has had a total hysterectomy. Concern for peritoneal signs on exam. UA with trace LE only; no blood; occasional RBC on microscopy. Toradol 60mg  IM given without relief. Pt arranged for ride, morphine 2mg  IM given with improvement in pain. CT scan to evaluate for renal stone scheduled for 11:15 this morning. Prescription for percocet given. Will call with the results of the CT scan.

## 2013-08-11 NOTE — Telephone Encounter (Signed)
Called and discussed CT scan results with patient.  CT negative for kidney stone or other acute process.  Diverticulosis without diverticulitis.  Remote terminal ileum inflammation.  Patient denies and history of bowel trouble.  States had a "loose" BM yesterday.  No blood in the stool.  There appeared to be moderate stool burden.  She states the pain is still there, but manageable.  Sent in Miralax to use with the percocet to prevent constipation.  She will f/u in clinic in 1 week.  She will go to the ER if her symptoms change or worsen over the weekend.

## 2013-08-11 NOTE — Progress Notes (Signed)
   Subjective:    Patient ID: Isabel Berger, female    DOB: [birth year], 47 y.o.   MRN: [id number]  HPI Isabel Berger is here for a SDA for acute left flank pain.  She reports driving yesterday afternoon when she suddenly developed a shooting pain in the left side/flank.  This was severe enough that she had to pull over and was doubled over the steering wheel.  Since this morning, the pain has been constant.  Described as a stabbing pain through her left side.  Denies any dysuria, fevers, chills.  No association with food.  States her urine is darker than normal, but denies frank hematuria.  No vaginal discharge or pain.  No constipation or blood in her stool.  She will get some relief if she leans over and presses on the left side.  She has not tried anything at home.  Was sick last week with a cold.  She has had a total hysterectomy.  I have reviewed and updated the following as appropriate: allergies and current medications SHx: never smoker   Review of Systems See HPI    Objective:   Physical Exam BP 148/95  Pulse 65  Temp(Src) 97.5 F (36.4 C) (Oral)  Ht 5\' 7"  (1.702 m)  Wt 243 lb 8 oz (110.451 kg)  BMI 38.13 kg/m2 Gen: alert, cooperative, moderate distress, shifting in seat HEENT: AT/[place], sclera white, MMM Neck: supple CV: RRR, no murmurs Pulm: CTAB, no wheezes or rales Back: +left CVA tenderness Abd: +BS, soft, ND, tender in LUQ and LLQ, no guarding, +rebound, +bed jostle Ext: no edema      Assessment & Plan:

## 2013-08-14 ENCOUNTER — Telehealth: Payer: Self-pay | Admitting: Family Medicine

## 2013-08-14 NOTE — Telephone Encounter (Signed)
Called pt. LMVM to call back. Please let pt know, that letter is ready. Thanks. Lorenda Hatchet, Renato Battles

## 2013-08-14 NOTE — Telephone Encounter (Signed)
Letter has been placed up front for pick up. 

## 2013-08-14 NOTE — Telephone Encounter (Signed)
Will fwd. To Dr.Honig. Lorenda Hatchet, Renato Battles

## 2013-08-14 NOTE — Telephone Encounter (Signed)
Pt called and needs a letter stating that she can return to work on Wednesday. Please call when this is ready. jw

## 2014-01-04 ENCOUNTER — Encounter: Payer: Self-pay | Admitting: Emergency Medicine

## 2014-01-04 ENCOUNTER — Ambulatory Visit (INDEPENDENT_AMBULATORY_CARE_PROVIDER_SITE_OTHER): Payer: BC Managed Care – PPO | Admitting: Emergency Medicine

## 2014-01-04 VITALS — BP 136/84 | HR 90 | Temp 98.0°F | Resp 18 | Wt 247.0 lb

## 2014-01-04 DIAGNOSIS — R5381 Other malaise: Secondary | ICD-10-CM

## 2014-01-04 DIAGNOSIS — R5383 Other fatigue: Principal | ICD-10-CM

## 2014-01-04 LAB — CBC
HEMATOCRIT: 36.2 % (ref 36.0–46.0)
HEMOGLOBIN: 12.3 g/dL (ref 12.0–15.0)
MCH: 29.7 pg (ref 26.0–34.0)
MCHC: 34 g/dL (ref 30.0–36.0)
MCV: 87.4 fL (ref 78.0–100.0)
Platelets: 397 10*3/uL (ref 150–400)
RBC: 4.14 MIL/uL (ref 3.87–5.11)
RDW: 13.5 % (ref 11.5–15.5)
WBC: 9.2 10*3/uL (ref 4.0–10.5)

## 2014-01-04 NOTE — Patient Instructions (Signed)
It was nice to meet you!  We checked some blood work today to try and figure out what is going on. You can try a B-complex vitamin to see if that helps.  I will call you with the results early next week and we will go from there.

## 2014-01-04 NOTE — Progress Notes (Signed)
Subjective:    Patient ID: Isabel Berger, female    DOB: [birth year], 48 y.o.   MRN: [id number]  HPI Isabel Berger is here for a same-day appointment for fatigue and shortness of breath.  She states that it has been several years since she has felt completely normal. However, in the last 2-3 weeks she has been getting worse. She reports feeling tired all the time. Also reports diffuse joint pains primarily in her knees and back, but also in her hands and feet. The hands and feet often spasm and cramp on her. In addition, she has been experiencing shortness of breath on exertion over the last 2-3 weeks, with some orthopnea. She also had worsening swelling in her feet, primarily the left foot. Her weight has been steadily increasing despite trying multiple diets.  She had a complete hysterectomy several years ago, has not seen blood in her stool. She is under a lot of stress at work. She has not had any rashes or sun sensitivity. She does have some intolerance to heat. Denies palpitations or tremor. Has experienced intermittent chest pain. This is located on the left side of the chest and improves with pressure. Additionally for the last 2-3 weeks her bowel movements have alternated between constipation and loose stools.  She has a history of well-controlled diabetes, last A1c in August 2014 was 6.3. She has a history of hypertension, which she states is typically only moderately well controlled. Mom has a history of congestive heart failure. No family history of autoimmune disease.  Current Outpatient Prescriptions on File Prior to Visit  Medication Sig Dispense Refill  . Blood Glucose Monitoring Suppl (PRODIGY BLOOD GLUCOSE MONITOR) W/DEVICE KIT Check your blood sugar 3 times a day.       . cetirizine (ZYRTEC) 10 MG tablet Take 1 tablet (10 mg total) by mouth daily.  30 tablet  11  . diclofenac (VOLTAREN) 75 MG EC tablet Take 1 tablet (75 mg total) by mouth 2 (two) times daily.  60 tablet  0    . exenatide (BYETTA 5 MCG PEN) 5 MCG/0.02ML SOLN Inject subcutaneously two times a day for diabetes. Dispense for 1 month       . FLUoxetine (PROZAC) 10 MG capsule Take 1 capsule (10 mg total) by mouth daily.  30 capsule  3  . fluticasone (FLONASE) 50 MCG/ACT nasal spray Place 2 sprays into the nose daily.  16 g  2  . gabapentin (NEURONTIN) 100 MG capsule Take 1 capsule (100 mg total) by mouth 3 (three) times daily. Start with 186m at bedtime and increase by 100 mg every 2-3 days until you see an effect or maximum 3030m3 times per day.  90 capsule  3  . glucose blood (PRODIGY TEST) test strip Test blood sugar 3 times a day.       . ibuprofen (ADVIL,MOTRIN) 800 MG tablet Take 800 mg by mouth every 8 (eight) hours as needed. For back and foot pain       . Insulin Pen Needle (B-D ULTRAFINE III SHORT PEN) 31G X 8 MM MISC dispense quanity sufficient for twice a day injections       . lisinopril-hydrochlorothiazide (PRINZIDE,ZESTORETIC) 20-12.5 MG per tablet Take 1 tablet by mouth daily. For blood pressure  30 tablet  11  . metFORMIN (GLUCOPHAGE) 500 MG tablet Take 500 mg by mouth 2 (two) times daily. For diabetes       . oxyCODONE-acetaminophen (ROXICET) 5-325 MG per tablet Take 1  tablet by mouth every 8 (eight) hours as needed for severe pain.  30 tablet  0  . Phenylephrine-Chlorphen-DM 05-28-11.5 MG/5ML LIQD Take 5 mLs by mouth every 4 (four) hours as needed. May cause drowsiness  120 mL  0  . polyethylene glycol powder (GLYCOLAX/MIRALAX) powder Take 17 g by mouth 2 (two) times daily as needed for mild constipation or moderate constipation.  3350 g  1  . Vitamin D, Ergocalciferol, (DRISDOL) 50000 UNIT CAPS Take 50,000 Units by mouth every 7 (seven) days. For 8 weeks        No current facility-administered medications on file prior to visit.    I have reviewed and updated the following as appropriate: allergies, current medications, past family history and past medical history SHx: non  smoker  Review of Systems See HPI    Objective:   Physical Exam BP 136/84  Pulse 90  Temp(Src) 98 F (36.7 C) (Oral)  Resp 18  Wt 247 lb (112.038 kg)  SpO2 100% Gen: alert, cooperative, NAD, appears tired HEENT: AT/[place], sclera white, MMM Neck: supple, mildly tender submandibular LAD CV: RRR, no murmurs or gallops Pulm: CTAB, no wheezes or rales Ext: trace to 1+ edema to the ankle on the left, 2+ DP pulses bilaterally     Assessment & Plan:

## 2014-01-04 NOTE — Assessment & Plan Note (Signed)
History is concerning for multiple possible etiologies, including cardiac, thyroid, autoimmune. Her physical exam is remarkable only for mild edema, obesity, submandibular lymphadenopathy. We will obtain blood work today, CMP, CBC, TSH, ESR, pro-BNP. I will call her with the results early next week and we will decide on the next step. In the meantime she can try a B complex vitamin to see if that helps.

## 2014-01-05 LAB — TSH: TSH: 1.015 u[IU]/mL (ref 0.350–4.500)

## 2014-01-05 LAB — COMPLETE METABOLIC PANEL WITH GFR
ALBUMIN: 4.1 g/dL (ref 3.5–5.2)
ALK PHOS: 104 U/L (ref 39–117)
ALT: 23 U/L (ref 0–35)
AST: 17 U/L (ref 0–37)
BUN: 10 mg/dL (ref 6–23)
CO2: 25 mEq/L (ref 19–32)
Calcium: 9.6 mg/dL (ref 8.4–10.5)
Chloride: 102 mEq/L (ref 96–112)
Creat: 0.73 mg/dL (ref 0.50–1.10)
GFR, Est African American: >89 mL/min
GFR, Est Non African American: >89 mL/min
Glucose, Bld: 139 mg/dL — ABNORMAL HIGH (ref 70–99)
POTASSIUM: 3.8 meq/L (ref 3.5–5.3)
SODIUM: 136 meq/L (ref 135–145)
TOTAL PROTEIN: 7 g/dL (ref 6.0–8.3)
Total Bilirubin: 0.2 mg/dL (ref 0.2–1.2)

## 2014-01-05 LAB — PRO B NATRIURETIC PEPTIDE: PRO B NATRI PEPTIDE: 6.54 pg/mL (ref <126)

## 2014-01-05 LAB — SEDIMENTATION RATE: SED RATE: 15 mm/h (ref 0–22)

## 2014-01-08 ENCOUNTER — Telehealth: Payer: Self-pay | Admitting: Emergency Medicine

## 2014-01-08 NOTE — Telephone Encounter (Signed)
Called and left message for patient.  Requested that she call our office with several times that she would be available to discuss her lab results.

## 2014-01-09 ENCOUNTER — Telehealth: Payer: Self-pay | Admitting: Family Medicine

## 2014-01-09 NOTE — Telephone Encounter (Signed)
Pt is returning Dr. Veverly Fells call. jw

## 2014-01-10 MED ORDER — VITAMIN D (ERGOCALCIFEROL) 1.25 MG (50000 UNIT) PO CAPS: 50000.0000 [IU] | ORAL_CAPSULE | ORAL | Status: DC

## 2014-01-10 NOTE — Telephone Encounter (Signed)
Called and spoke with patient regarding lab results.  Blood work is all normal.  On reviewing her old blood work, she was noted to be quite vitamin D depleted.  Will give vitamin D supplements 50,000 units weekly x8 weeks.  She is very concerned about the weight gain.  She asked about ginko balboa and I see no reason for her not to take this.  She was on phentermine over 20 years ago with good results.  She will f/u in 1 month.  If weight is not improving, could consider a repeat 3 month course of phentermine.  Will need vitamin D level in 2 months.

## 2014-01-10 NOTE — Telephone Encounter (Signed)
Please call Ms Isabel Berger back for results.  She was left a msg that she should call back and to also make an appt to sit down with provider to discuss.  Pt want to know why.

## 2014-02-28 ENCOUNTER — Ambulatory Visit (INDEPENDENT_AMBULATORY_CARE_PROVIDER_SITE_OTHER): Payer: BC Managed Care – PPO | Admitting: Family Medicine

## 2014-02-28 ENCOUNTER — Ambulatory Visit
Admission: RE | Admit: 2014-02-28 | Discharge: 2014-02-28 | Disposition: A | Discharge status: Home / Self Care | Payer: Self-pay | Source: Ambulatory Visit | Attending: Family Medicine | Admitting: Family Medicine

## 2014-02-28 ENCOUNTER — Telehealth: Payer: Self-pay | Admitting: Family Medicine

## 2014-02-28 ENCOUNTER — Encounter: Payer: Self-pay | Admitting: Family Medicine

## 2014-02-28 VITALS — BP 145/93 | HR 84 | Ht 67.0 in | Wt 249.0 lb

## 2014-02-28 DIAGNOSIS — M79672 Pain in left foot: Secondary | ICD-10-CM

## 2014-02-28 DIAGNOSIS — M79609 Pain in unspecified limb: Secondary | ICD-10-CM

## 2014-02-28 MED ORDER — TRAMADOL HCL 50 MG PO TABS: 50.0000 mg | ORAL_TABLET | Freq: Three times a day (TID) | ORAL | Status: DC | PRN

## 2014-02-28 NOTE — Telephone Encounter (Signed)
Patient informed of message from MD, expressed understanding. 

## 2014-02-28 NOTE — Telephone Encounter (Signed)
Per her XR she does not have a fracture. She does have degenerative changes. Will ask staff to let her know she can dc post op shoe, use Ice for swelling and to follow up with PCP if she is not getting better in a week or so.  Laroy Apple, MD [place] Resident, PGY-3 02/28/2014, 1:20 PM

## 2014-02-28 NOTE — Patient Instructions (Signed)
Great to meet you!  We will call later about your foot xray. Until then, be careful and take it easy.

## 2014-02-28 NOTE — Progress Notes (Signed)
Patient ID: Isabel Berger, female   DOB: 1965-09-12, 48 y.o.   MRN: [id number]  Kenn File, MD Phone: [phone]  Subjective:  Chief complaint-noted  Pt Here for foot pain and possible foot fracture  Patient states that Friday afternoon her son was moving a large wooden furniture [place] when he realized it was to have you drop to on his mother's foot. Since that time she's had pain and swelling of the left foot. She has been able to bear weight but has been hobbling and now has back pain due to be awkward gait. She's been taking 800 mg of ibuprofen which has not improved her pain at all.  She comes in now due to severe pain that is not resolved by ibuprofen.   ROS-  No fevers, chills, sweats No dyspnea No abdominal pain Positive back pain- no bowel or bladder dysfunction, saddle anesthesia, or lower extremity weakness.  Past Medical History Patient Active Problem List   Diagnosis Date Noted  . Other malaise and fatigue 01/04/2014  . Left flank pain 08/11/2013  . Tenosynovitis of thumb 05/09/2013  . Allergic rhinitis 04/18/2013  . Left foot pain 10/27/2010  . Right foot pain 10/27/2010  . VITAMIN D DEFICIENCY 01/02/2010  . DM 11/20/2009  . OBESITY 11/20/2009  . ANXIETY DEPRESSION 11/20/2009  . HYPERTENSION, ESSENTIAL 11/20/2009  . BACK PAIN 11/20/2009    Medications- reviewed and updated Current Outpatient Prescriptions  Medication Sig Dispense Refill  . Blood Glucose Monitoring Suppl (PRODIGY BLOOD GLUCOSE MONITOR) W/DEVICE KIT Check your blood sugar 3 times a day.       . cetirizine (ZYRTEC) 10 MG tablet Take 1 tablet (10 mg total) by mouth daily.  30 tablet  11  . diclofenac (VOLTAREN) 75 MG EC tablet Take 1 tablet (75 mg total) by mouth 2 (two) times daily.  60 tablet  0  . exenatide (BYETTA 5 MCG PEN) 5 MCG/0.02ML SOLN Inject subcutaneously two times a day for diabetes. Dispense for 1 month       . FLUoxetine (PROZAC) 10 MG capsule Take 1 capsule (10 mg total)  by mouth daily.  30 capsule  3  . fluticasone (FLONASE) 50 MCG/ACT nasal spray Place 2 sprays into the nose daily.  16 g  2  . gabapentin (NEURONTIN) 100 MG capsule Take 1 capsule (100 mg total) by mouth 3 (three) times daily. Start with 148m at bedtime and increase by 100 mg every 2-3 days until you see an effect or maximum 3067m3 times per day.  90 capsule  3  . glucose blood (PRODIGY TEST) test strip Test blood sugar 3 times a day.       . ibuprofen (ADVIL,MOTRIN) 800 MG tablet Take 800 mg by mouth every 8 (eight) hours as needed. For back and foot pain       . Insulin Pen Needle (B-D ULTRAFINE III SHORT PEN) 31G X 8 MM MISC dispense quanity sufficient for twice a day injections       . lisinopril-hydrochlorothiazide (PRINZIDE,ZESTORETIC) 20-12.5 MG per tablet Take 1 tablet by mouth daily. For blood pressure  30 tablet  11  . metFORMIN (GLUCOPHAGE) 500 MG tablet Take 500 mg by mouth 2 (two) times daily. For diabetes       . oxyCODONE-acetaminophen (ROXICET) 5-325 MG per tablet Take 1 tablet by mouth every 8 (eight) hours as needed for severe pain.  30 tablet  0  . Phenylephrine-Chlorphen-DM 05-28-11.5 MG/5ML LIQD Take 5 mLs by mouth every 4 (four)  hours as needed. May cause drowsiness  120 mL  0  . polyethylene glycol powder (GLYCOLAX/MIRALAX) powder Take 17 g by mouth 2 (two) times daily as needed for mild constipation or moderate constipation.  3350 g  1  . traMADol (ULTRAM) 50 MG tablet Take 1 tablet (50 mg total) by mouth every 8 (eight) hours as needed.  30 tablet  0  . Vitamin D, Ergocalciferol, (DRISDOL) 50000 UNITS CAPS capsule Take 1 capsule (50,000 Units total) by mouth every 7 (seven) days. For 8 weeks  8 capsule  0   No current facility-administered medications for this visit.    Objective: BP 145/93  Pulse 84  Ht 5' 7" (1.702 m)  Wt 249 lb (112.946 kg)  BMI 38.99 kg/m2 Gen: NAD, alert, cooperative with exam HEENT: NCAT,  CV: RRR, good S1/S2, no murmur Resp: CTABL, no  wheezes, non-labored Ext: Left foot slightly swollen compared to the right, no obvious deformity, erythema, or lesions in the skin., Left dorsum foot with severe tenderness over the fourth metatarsal. Patient can bear weight but with most of the right hip. Neuro: Alert and oriented, No gross deficits   Assessment/Plan:  Left foot pain Left hip pain after dropping a large wooden object on it. Reasonable suspicion for fracture, likely not displaced Previously has seen [facility] Discussed options of crutches versus postop shoe the patient, shows the postop shoe X-ray this morning, likely ortho referral for fracture management Tramadol 50 mg 1-2 every 6 when necessary for pain    Orders Placed This Encounter  Procedures  . DG Foot Complete Left    Standing Status: Future     Number of Occurrences:      Standing Expiration Date: 05/02/2015    Order Specific Question:  Reason for Exam (SYMPTOM  OR DIAGNOSIS REQUIRED)    Answer:  Left foot pain    Order Specific Question:  Is the patient pregnant?    Answer:  No    Order Specific Question:  Preferred imaging location?    Answer:  [id number]    Meds ordered this encounter  Medications  . traMADol (ULTRAM) 50 MG tablet    Sig: Take 1 tablet (50 mg total) by mouth every 8 (eight) hours as needed.    Dispense:  30 tablet    Refill:  0

## 2014-02-28 NOTE — Assessment & Plan Note (Signed)
Left hip pain after dropping a large wooden object on it. Reasonable suspicion for fracture, likely not displaced Previously has seen [facility] Discussed options of crutches versus postop shoe the patient, shows the postop shoe X-ray this morning, likely ortho referral for fracture management Tramadol 50 mg 1-2 every 6 when necessary for pain

## 2014-03-12 ENCOUNTER — Ambulatory Visit (INDEPENDENT_AMBULATORY_CARE_PROVIDER_SITE_OTHER): Payer: BC Managed Care – PPO | Admitting: Family Medicine

## 2014-03-12 ENCOUNTER — Encounter: Payer: Self-pay | Admitting: Family Medicine

## 2014-03-12 VITALS — BP 130/90 | HR 81 | Temp 98.5°F | Wt 243.0 lb

## 2014-03-12 DIAGNOSIS — E119 Type 2 diabetes mellitus without complications: Secondary | ICD-10-CM

## 2014-03-12 DIAGNOSIS — E669 Obesity, unspecified: Secondary | ICD-10-CM

## 2014-03-12 LAB — POCT GLYCOSYLATED HEMOGLOBIN (HGB A1C): Hemoglobin A1C: 6.8

## 2014-03-12 NOTE — Patient Instructions (Addendum)
I think that the best plan for your weight loss is to meet with our nutritionist, Dr. Jenne Campus. Please also continue getting physical activity, 30 minutes at least 5 times a week.  I am not comfortable prescribing weight loss medication that could cause more problems with your heart or blood pressure.  I would like to see you back in 1 month to talk about your progress.  Thank you

## 2014-03-13 NOTE — Progress Notes (Signed)
   Subjective:    Patient ID: Isabel Berger, female    DOB: January 31, 1966, 48 y.o.   MRN: [id number]  HPI Pt presents for wt management. Reports weight gradually rising over the past few years. Is very motivated to lose weight because of her grandchildren. Has noticed increasing dyspnea and fatigue with minimal activity related to her weight. So far she has stopped going out to eat, is eating more salads, and has starting walking some. She reports in the past phentermine has worked well and she would like to try this again. She did this in combination with dietitian at previous PCP with good results which she was able to maintain off meds for several years until she had some social stressors that threw things off track.   Review of Systems  All other systems reviewed and are negative.      Objective:   Physical Exam  Nursing note and vitals reviewed. Constitutional: She is oriented to person, place, and time. She appears well-developed and well-nourished. No distress.  HENT:  Head: Normocephalic and atraumatic.  Eyes: Conjunctivae are normal. Right eye exhibits no discharge. Left eye exhibits no discharge. No scleral icterus.  Cardiovascular: Normal rate, regular rhythm, normal heart sounds and intact distal pulses.   No murmur heard. Pulmonary/Chest: Effort normal and breath sounds normal. No respiratory distress. She has no wheezes.  Abdominal: Soft. Bowel sounds are normal. She exhibits no distension. There is no tenderness.  Musculoskeletal: She exhibits no edema and no tenderness.  Neurological: She is alert and oriented to person, place, and time.  Skin: Skin is warm and dry. She is not diaphoretic.  Psychiatric: She has a normal mood and affect. Her behavior is normal.          Assessment & Plan:

## 2014-03-13 NOTE — Assessment & Plan Note (Signed)
Pt very motivated to lose weight - discussed concerns about med safety given htn/cards risks - not meds at this time - refer to [facility] for comprehensive weight loss plan - f/u in 1 month

## 2014-06-01 ENCOUNTER — Ambulatory Visit (INDEPENDENT_AMBULATORY_CARE_PROVIDER_SITE_OTHER): Payer: Self-pay | Admitting: Family Medicine

## 2014-06-01 VITALS — BP 133/86 | HR 74 | Temp 98.5°F | Ht 67.0 in | Wt 244.0 lb

## 2014-06-01 DIAGNOSIS — N63 Unspecified lump in unspecified breast: Secondary | ICD-10-CM | POA: Insufficient documentation

## 2014-06-01 MED ORDER — OXYCODONE-ACETAMINOPHEN 10-325 MG PO TABS: 1.0000 | ORAL_TABLET | Freq: Three times a day (TID) | ORAL | Status: DC | PRN

## 2014-06-01 NOTE — Patient Instructions (Signed)
We need to get a mammogram to evaluate this lump on your breast. I am prescribing a strong pain medicine and recommend you take 2 of them before the procedure. You can take 1/2 - 1 tab other times as needed for pain.  I will call you with the results of your mammogram and we can go from there.

## 2014-06-07 ENCOUNTER — Other Ambulatory Visit: Payer: BC Managed Care – PPO

## 2014-06-08 ENCOUNTER — Other Ambulatory Visit: Payer: Self-pay

## 2014-06-08 ENCOUNTER — Other Ambulatory Visit (HOSPITAL_COMMUNITY): Payer: Self-pay | Admitting: *Deleted

## 2014-06-08 DIAGNOSIS — N6320 Unspecified lump in the left breast, unspecified quadrant: Secondary | ICD-10-CM

## 2014-06-13 ENCOUNTER — Telehealth: Payer: Self-pay | Admitting: Family Medicine

## 2014-06-13 NOTE — Telephone Encounter (Signed)
Refill request for oxycodone. Was given 20 tabs on 06/01/14. States still having pain from breast mass.

## 2014-06-15 NOTE — Telephone Encounter (Signed)
She should come back in to have someone look at it again since I expected to have the mammogram sooner than this.

## 2014-06-18 ENCOUNTER — Encounter: Payer: Self-pay | Admitting: Family Medicine

## 2014-06-18 NOTE — Assessment & Plan Note (Signed)
Concern for malignancy, patient reports pain with previous mammogram and is worried this will be worse with tenderness - mammo and ultrasound ordered - rx given for percocet and patient instructed to take 2 prior to mammogram - f/u after mammogram

## 2014-06-18 NOTE — Telephone Encounter (Signed)
Called patient, line busy. Will try again later.

## 2014-06-18 NOTE — Progress Notes (Signed)
   Subjective:    Patient ID: Isabel Berger, female    DOB: 1966/05/08, 48 y.o.   MRN: [id number]  HPI Pt presents for breast lump that is painful. She reports the lump appeared several weeks ago and is sometimes quite painful and tender to the touch. She has no skin changes, fevers, warmth or redness. She does not feel that it is changing in size. She has an aunt with breast cancer and came today because family were concerned about this.    Review of Systems See HPI    Objective:   Physical Exam  Nursing note and vitals reviewed. Constitutional: She is oriented to person, place, and time. She appears well-developed and well-nourished. No distress.  HENT:  Head: Normocephalic and atraumatic.  Eyes: Conjunctivae are normal. Right eye exhibits no discharge. Left eye exhibits no discharge. No scleral icterus.  Cardiovascular: Normal rate.   Pulmonary/Chest: Effort normal. Right breast exhibits no inverted nipple, no mass, no nipple discharge, no skin change and no tenderness. Left breast exhibits mass and tenderness. Left breast exhibits no inverted nipple, no nipple discharge and no skin change. Breasts are symmetrical.    Abdominal: She exhibits no distension.  Neurological: She is alert and oriented to person, place, and time.  Skin: Skin is warm and dry. No rash noted. She is not diaphoretic.  Psychiatric: She has a normal mood and affect. Her behavior is normal.          Assessment & Plan:

## 2014-06-19 ENCOUNTER — Ambulatory Visit (HOSPITAL_COMMUNITY)
Admission: RE | Admit: 2014-06-19 | Discharge: 2014-06-19 | Disposition: A | Discharge status: Home / Self Care | Payer: No Typology Code available for payment source | Source: Ambulatory Visit | Attending: Obstetrics and Gynecology | Admitting: Obstetrics and Gynecology

## 2014-06-19 ENCOUNTER — Ambulatory Visit
Admission: RE | Admit: 2014-06-19 | Discharge: 2014-06-19 | Disposition: A | Discharge status: Home / Self Care | Payer: No Typology Code available for payment source | Source: Ambulatory Visit | Attending: Obstetrics and Gynecology | Admitting: Obstetrics and Gynecology

## 2014-06-19 ENCOUNTER — Encounter (HOSPITAL_COMMUNITY): Payer: Self-pay

## 2014-06-19 VITALS — BP 140/80 | Ht 67.0 in | Wt 245.2 lb

## 2014-06-19 DIAGNOSIS — N6320 Unspecified lump in the left breast, unspecified quadrant: Secondary | ICD-10-CM

## 2014-06-19 DIAGNOSIS — N6325 Unspecified lump in the left breast, overlapping quadrants: Secondary | ICD-10-CM

## 2014-06-19 DIAGNOSIS — Z1239 Encounter for other screening for malignant neoplasm of breast: Secondary | ICD-10-CM

## 2014-06-19 NOTE — Patient Instructions (Signed)
Explained to [employer] that she did not need a Pap smear today due to her history of a hysterectomy for benign reasons. Let patient know that she no longer needs Pap smears due to her history of hysterectomy for benign reasons. Referred patient to the [facility] for diagnostic mammogram and possible left breast ultrasound. Appointment scheduled for Tuesday, June 19, 2014 at 1015. Patient aware of appointment and will be there. [place] verbalized understanding.  Jomarie Gellis, [name], RN 8:45 AM

## 2014-06-19 NOTE — Progress Notes (Signed)
Complaints of left breast lump and pain x over a month. Patient states the lump has increased in size and is painful. Patient states the pain increases as the day progresses. Patient rated the pain at a 7 out of 10.  Pap Smear:  Pap smear not completed today. Last Pap smear was in [address] and normal per patient. Per patient has no history of an abnormal Pap smear. Patient has a history of a hysterectomy 05/24/2003 due to fibroids. Patient no longer needs Pap smears due to her history of a hysterectomy for benign reasons per BCCCP and ACOG guidelines. No Pap smear results in EPIC.  Physical exam: Breasts Breasts symmetrical. No skin abnormalities bilateral breasts. No nipple retraction bilateral breasts. No nipple discharge bilateral breasts. No lymphadenopathy. No lumps palpated right breast. Palpated a pea sized lump within the left breast at 9 o'clock 10 cm from the nipple. Complaints of left breast pain on exam. Referred patient to the [facility] for diagnostic mammogram and possible left breast ultrasound. Appointment scheduled for Tuesday, June 19, 2014 at 1015.  Pelvic/Bimanual No Pap smear completed today since patient has a history of a hysterectomy for benign reasons. Pap smear not indicated per BCCCP guidelines.

## 2014-06-21 ENCOUNTER — Ambulatory Visit (HOSPITAL_BASED_OUTPATIENT_CLINIC_OR_DEPARTMENT_OTHER): Payer: Self-pay

## 2014-06-21 ENCOUNTER — Ambulatory Visit: Payer: Self-pay

## 2014-06-21 ENCOUNTER — Ambulatory Visit: Payer: BC Managed Care – PPO

## 2014-06-21 ENCOUNTER — Other Ambulatory Visit: Payer: Self-pay

## 2014-06-21 VITALS — BP 124/84 | HR 74 | Temp 98.7°F | Resp 16 | Ht 66.75 in | Wt 243.7 lb

## 2014-06-21 DIAGNOSIS — Z0000 Encounter for general adult medical examination without abnormal findings: Secondary | ICD-10-CM

## 2014-06-21 LAB — LIPID PANEL
Cholesterol: 138 mg/dL (ref 0–200)
HDL: 31 mg/dL — AB (ref >39)
LDL Cholesterol: 85 mg/dL (ref 0–99)
Total CHOL/HDL Ratio: 4.5 Ratio
Triglycerides: 110 mg/dL (ref <150)
VLDL: 22 mg/dL (ref 0–40)

## 2014-06-21 LAB — GLUCOSE (CC13): Glucose: 101 mg/dl (ref 70–140)

## 2014-06-21 LAB — HEMOGLOBIN A1C
HEMOGLOBIN A1C: 6.8 % — AB (ref <5.7)
MEAN PLASMA GLUCOSE: 148 mg/dL — AB (ref <117)
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
## 2014-06-21 NOTE — Patient Instructions (Signed)
Discussed health assessment with patient. Patient will be called with results of lab work and we will then discussed any further follow up the patient needs. Patient will eat three meals a day. Patient will develop and start a plan to switch over to sugar free drink or water and use artifical sweeteners to sweeten coffee or tea.Patient will call for eligibility appointment and her doctor about medications. Patient verbalized understanding.

## 2014-06-22 ENCOUNTER — Telehealth: Payer: Self-pay

## 2014-06-22 NOTE — Telephone Encounter (Signed)
Called to give lab results. Will call back or have patient return call.

## 2014-06-25 ENCOUNTER — Encounter (HOSPITAL_COMMUNITY): Payer: Self-pay

## 2014-06-26 ENCOUNTER — Telehealth: Payer: Self-pay

## 2014-06-26 NOTE — Telephone Encounter (Addendum)
Called to inform about lab work from 06/21/14. Informed patient: cholesterol- 138, HDL- 31, LDL- 85, triglycerides - 110, Bld Glucose -101 and HBG-A1C - 6.8.  Set up health Coaching for November 12 at 10 AM at cancer center for nutrition, activity and medicine adherence. Gave patient a pill box when came for assessment.

## 2014-07-05 ENCOUNTER — Ambulatory Visit: Payer: Self-pay

## 2014-07-30 ENCOUNTER — Telehealth: Payer: Self-pay

## 2014-07-30 NOTE — Telephone Encounter (Signed)
Called to follow up on HTN, diabetes, BMI 38.5, and medicine adherence. Patient missed Health Coaching appointment on 07/05/14. Ask patient to return phone call.

## 2014-08-02 ENCOUNTER — Telehealth: Payer: Self-pay

## 2014-08-02 NOTE — Telephone Encounter (Signed)
Called to follow up on need for Health Coaching and how was doing with medication. Left message to call back.

## 2014-08-04 ENCOUNTER — Emergency Department (HOSPITAL_COMMUNITY)
Admission: EM | Admit: 2014-08-04 | Discharge: 2014-08-04 | Discharge status: Against Medical Advice | Payer: Self-pay | Attending: Emergency Medicine | Admitting: Emergency Medicine

## 2014-08-04 ENCOUNTER — Encounter (HOSPITAL_COMMUNITY): Payer: Self-pay | Admitting: Emergency Medicine

## 2014-08-04 DIAGNOSIS — F419 Anxiety disorder, unspecified: Secondary | ICD-10-CM | POA: Insufficient documentation

## 2014-08-04 DIAGNOSIS — E119 Type 2 diabetes mellitus without complications: Secondary | ICD-10-CM | POA: Insufficient documentation

## 2014-08-04 DIAGNOSIS — R0602 Shortness of breath: Secondary | ICD-10-CM | POA: Insufficient documentation

## 2014-08-04 DIAGNOSIS — I10 Essential (primary) hypertension: Secondary | ICD-10-CM | POA: Insufficient documentation

## 2014-08-04 DIAGNOSIS — R079 Chest pain, unspecified: Secondary | ICD-10-CM | POA: Insufficient documentation

## 2014-08-04 LAB — CBC WITH DIFFERENTIAL/PLATELET
Basophils Absolute: 0 10*3/uL (ref 0.0–0.1)
Basophils Relative: 0 % (ref 0–1)
EOS PCT: 1 % (ref 0–5)
Eosinophils Absolute: 0.1 10*3/uL (ref 0.0–0.7)
HEMATOCRIT: 35.5 % — AB (ref 36.0–46.0)
Hemoglobin: 11.8 g/dL — ABNORMAL LOW (ref 12.0–15.0)
Lymphocytes Relative: 42 % (ref 12–46)
Lymphs Abs: 4 10*3/uL (ref 0.7–4.0)
MCH: 29.8 pg (ref 26.0–34.0)
MCHC: 33.2 g/dL (ref 30.0–36.0)
MCV: 89.6 fL (ref 78.0–100.0)
MONO ABS: 0.6 10*3/uL (ref 0.1–1.0)
Monocytes Relative: 6 % (ref 3–12)
Neutro Abs: 4.9 10*3/uL (ref 1.7–7.7)
Neutrophils Relative %: 51 % (ref 43–77)
Platelets: 390 10*3/uL (ref 150–400)
RBC: 3.96 MIL/uL (ref 3.87–5.11)
RDW: 12.9 % (ref 11.5–15.5)
WBC: 9.5 10*3/uL (ref 4.0–10.5)

## 2014-08-04 LAB — I-STAT TROPONIN, ED: Troponin i, poc: 0 ng/mL (ref 0.00–0.08)

## 2014-08-04 LAB — BASIC METABOLIC PANEL
Anion gap: 12 (ref 5–15)
BUN: 12 mg/dL (ref 6–23)
CALCIUM: 9.4 mg/dL (ref 8.4–10.5)
CHLORIDE: 103 meq/L (ref 96–112)
CO2: 25 mEq/L (ref 19–32)
CREATININE: 0.73 mg/dL (ref 0.50–1.10)
GFR calc non Af Amer: >90 mL/min (ref >90)
Glucose, Bld: 153 mg/dL — ABNORMAL HIGH (ref 70–99)
Potassium: 3.7 mEq/L (ref 3.7–5.3)
Sodium: 140 mEq/L (ref 137–147)

## 2014-08-04 NOTE — ED Notes (Signed)
Pt c/o generalized CP and sts increased stress and feels anxious starting last night; pt sts some SOB

## 2014-08-21 ENCOUNTER — Telehealth: Payer: Self-pay

## 2014-08-21 NOTE — Telephone Encounter (Signed)
Second time of calling. The patient had stated that would be ready in January.

## 2014-08-21 NOTE — Telephone Encounter (Signed)
Left patient message to return call. If does not return call within one week I send a certified letter because this is my third attempt to reach patient

## 2014-09-03 ENCOUNTER — Ambulatory Visit: Payer: Self-pay | Admitting: Family Medicine

## 2014-10-11 ENCOUNTER — Telehealth: Payer: Self-pay | Admitting: Family Medicine

## 2014-10-11 NOTE — Telephone Encounter (Signed)
Has missed work because of breast pain. Does not have an appt with breast center until march Over the counter stuff is not working What can be done?

## 2014-10-12 NOTE — Telephone Encounter (Signed)
If pain is not well controlled she should be seen again to be re-evaluated.

## 2014-10-15 NOTE — Telephone Encounter (Signed)
LMOVM informing patient, patient has appointment scheduled already for 2/18.

## 2014-10-16 ENCOUNTER — Other Ambulatory Visit (HOSPITAL_COMMUNITY): Payer: Self-pay | Admitting: *Deleted

## 2014-10-16 DIAGNOSIS — N6320 Unspecified lump in the left breast, unspecified quadrant: Secondary | ICD-10-CM

## 2014-10-17 ENCOUNTER — Ambulatory Visit: Payer: Self-pay | Admitting: Family Medicine

## 2014-10-24 ENCOUNTER — Telehealth: Payer: Self-pay | Admitting: Family Medicine

## 2014-10-24 NOTE — Telephone Encounter (Signed)
Pt is calling for a 1 day supply of pain medication since she is having 3 different xrays of her breast tomorrow 10/25/14. She said the pain is very bad and would not be able to go through this without the pain medicine. jw

## 2014-10-25 ENCOUNTER — Ambulatory Visit (HOSPITAL_COMMUNITY)
Admission: RE | Admit: 2014-10-25 | Discharge: 2014-10-25 | Disposition: A | Discharge status: Home / Self Care | Payer: Self-pay | Source: Ambulatory Visit | Attending: Obstetrics and Gynecology | Admitting: Obstetrics and Gynecology

## 2014-10-25 ENCOUNTER — Ambulatory Visit
Admission: RE | Admit: 2014-10-25 | Discharge: 2014-10-25 | Disposition: A | Discharge status: Home / Self Care | Payer: No Typology Code available for payment source | Source: Ambulatory Visit | Attending: Obstetrics and Gynecology | Admitting: Obstetrics and Gynecology

## 2014-10-25 ENCOUNTER — Encounter (HOSPITAL_COMMUNITY): Payer: Self-pay

## 2014-10-25 VITALS — BP 128/80 | Temp 98.2°F | Ht 67.0 in | Wt 238.4 lb

## 2014-10-25 DIAGNOSIS — N644 Mastodynia: Secondary | ICD-10-CM

## 2014-10-25 DIAGNOSIS — Z1239 Encounter for other screening for malignant neoplasm of breast: Secondary | ICD-10-CM

## 2014-10-25 DIAGNOSIS — N6320 Unspecified lump in the left breast, unspecified quadrant: Secondary | ICD-10-CM

## 2014-10-25 NOTE — Progress Notes (Signed)
Complaints of left breast lump since around September 2015 that has had increasing pain over the last 4 weeks. Patient states the pain increases as the day progresses and that it is a constant pain. Patient rated the pain at a 8 out of 10.  Pap Smear: Pap smear not completed today. Last Pap smear was in [address] and normal per patient. Per patient has no history of an abnormal Pap smear. Patient has a history of a hysterectomy 05/24/2003 due to fibroids. Patient no longer needs Pap smears due to her history of a hysterectomy for benign reasons per BCCCP and ACOG guidelines. No Pap smear results in EPIC.  Physical exam: Breasts Breasts symmetrical. No skin abnormalities bilateral breasts. No nipple retraction bilateral breasts. No nipple discharge bilateral breasts. No lymphadenopathy. No lumps palpated right breast. Palpated a thickened area versus lump within the left breast at 9 o'clock 10 cm from the nipple. Complaints of left breast pain on exam that started in the center increasing at site of questionable lump. Referred patient to the [facility] for left breast diagnostic mammogram and possible left breast ultrasound. Appointment scheduled for Thursday, October 25, 2014 at 1050.  Pelvic/Bimanual No Pap smear completed today since patient has a history of a hysterectomy for benign reasons. Pap smear not indicated per BCCCP guidelines.

## 2014-10-25 NOTE — Telephone Encounter (Signed)
Patient's studies already done

## 2014-10-25 NOTE — Patient Instructions (Signed)
Education materials given on self breast awareness. Explained to [employer] that she did not need a Pap smear today due to her history of a hysterectomy for benign reasons. Let patient know that she no longer needs Pap smears due to her history of hysterectomy for benign reasons. Referred patient to the [facility] for left breast diagnostic mammogram and possible left breast ultrasound. Appointment scheduled for Thursday, October 25, 2014 at 1050. Patient aware of appointment and will be there. [name] verbalized understanding.  [name], [name], RN 10:10 AM

## 2015-01-17 ENCOUNTER — Ambulatory Visit: Payer: Self-pay

## 2015-02-07 ENCOUNTER — Encounter: Payer: Self-pay | Admitting: Family Medicine

## 2015-02-07 ENCOUNTER — Ambulatory Visit (INDEPENDENT_AMBULATORY_CARE_PROVIDER_SITE_OTHER): Payer: Self-pay | Admitting: Family Medicine

## 2015-02-07 VITALS — BP 158/92 | HR 103 | Temp 98.3°F | Wt 233.0 lb

## 2015-02-07 DIAGNOSIS — M545 Low back pain, unspecified: Secondary | ICD-10-CM

## 2015-02-07 MED ORDER — IBUPROFEN 800 MG PO TABS: 800.0000 mg | ORAL_TABLET | Freq: Three times a day (TID) | ORAL | Status: DC

## 2015-02-07 MED ORDER — CYCLOBENZAPRINE HCL 10 MG PO TABS: 10.0000 mg | ORAL_TABLET | Freq: Three times a day (TID) | ORAL | Status: DC | PRN

## 2015-02-07 NOTE — Assessment & Plan Note (Signed)
Patient presenting with lumbrosacral pain after lifting on Friday. Pain is worse with movement, sitting, and bending over and feels like a burning/pulling sensation. This is most likely 2/2 paraspinal muscle spasms/strain. No worrisome symptoms on history or exam (loss of bowel/bladder function, paresthesias, weakness, and negative SLR). No fevers/chills for something more concerning such as infection.  - Flexeril TID PRN  - Scheduled Ibuprofen x 5 days, then as needed  - Heat and stretches discussed - RTC precautions discussed. - Patient to RTC if she's not starting to have some improvement in the next 2 weeks, or sooner if worsening.

## 2015-02-07 NOTE — Progress Notes (Signed)
Patient ID: Isabel Berger, female   DOB: 12/25/1965, 49 y.o.   MRN: [id number]    Subjective: CC: back pain  HPI: Patient is a 49 y.o. female presenting to clinic today for lower back pain.  BACK PAIN  Back pain began 6 days ago. Pain is described as: Lumbrosacral pain is "burrning" in nature and pulling.  Pain worse with putting pressure on legs initially and sitting down. Pain shooting down legs bilaterally occasionally, can't note a trigger that makes the pain radiate.  Patient has tried [employer] (worked at first), ibuprofen 873m (no help), and aleve (no sleep). History of trauma or injury: Lifting a heavy box. Prior history of similar pain: yes, after lifting furniture several years ago. History of cancer: no Weak immune system:  No  History of IV drug use: No History of steroid use: No   Symptoms Incontinence of bowel or bladder:  no Numbness of leg: no Fever: no Rest or Night pain: with sitting (as stated above) Weight Loss:  non Rash: no  Patient believes the lifting might be causing their pain.  Social History: no smoking  ROS: All other systems reviewed and are negative.  Past Medical History Patient Active Problem List   Diagnosis Date Noted  . Breast lump on left side at 9 o'clock position 06/19/2014  . Breast mass in female 06/01/2014  . Other malaise and fatigue 01/04/2014  . Tenosynovitis of thumb 05/09/2013  . Allergic rhinitis 04/18/2013  . VITAMIN D DEFICIENCY 01/02/2010  . DM 11/20/2009  . OBESITY 11/20/2009  . ANXIETY DEPRESSION 11/20/2009  . HYPERTENSION, ESSENTIAL 11/20/2009  . Backache 11/20/2009    Medications- reviewed and updated Current Outpatient Prescriptions  Medication Sig Dispense Refill  . Blood Glucose Monitoring Suppl (PRODIGY BLOOD GLUCOSE MONITOR) W/DEVICE KIT Check your blood sugar 3 times a day.     . cetirizine (ZYRTEC) 10 MG tablet Take 1 tablet (10 mg total) by mouth daily. 30 tablet 11  . cyclobenzaprine  (FLEXERIL) 10 MG tablet Take 1 tablet (10 mg total) by mouth 3 (three) times daily as needed for muscle spasms. 30 tablet 0  . exenatide (BYETTA 5 MCG PEN) 5 MCG/0.02ML SOLN Inject subcutaneously two times a day for diabetes. Dispense for 1 month     . FLUoxetine (PROZAC) 10 MG capsule Take 1 capsule (10 mg total) by mouth daily. (Patient not taking: Reported on 10/25/2014) 30 capsule 3  . fluticasone (FLONASE) 50 MCG/ACT nasal spray Place 2 sprays into the nose daily. 16 g 2  . gabapentin (NEURONTIN) 100 MG capsule Take 1 capsule (100 mg total) by mouth 3 (three) times daily. Start with 1082mat bedtime and increase by 100 mg every 2-3 days until you see an effect or maximum 30070m times per day. (Patient not taking: Reported on 10/25/2014) 90 capsule 3  . glucose blood (PRODIGY TEST) test strip Test blood sugar 3 times a day.     . ibuprofen (ADVIL,MOTRIN) 800 MG tablet Take 1 tablet (800 mg total) by mouth 3 (three) times daily. For back for 5 days, then as needed. 30 tablet 0  . Insulin Pen Needle (B-D ULTRAFINE III SHORT PEN) 31G X 8 MM MISC dispense quanity sufficient for twice a day injections     . lisinopril-hydrochlorothiazide (PRINZIDE,ZESTORETIC) 20-12.5 MG per tablet Take 1 tablet by mouth daily. For blood pressure (Patient not taking: Reported on 10/25/2014) 30 tablet 11  . metFORMIN (GLUCOPHAGE) 500 MG tablet Take 500 mg by mouth 2 (  two) times daily. For diabetes     . Phenylephrine-Chlorphen-DM 05-28-11.5 MG/5ML LIQD Take 5 mLs by mouth every 4 (four) hours as needed. May cause drowsiness (Patient not taking: Reported on 10/25/2014) 120 mL 0  . polyethylene glycol powder (GLYCOLAX/MIRALAX) powder Take 17 g by mouth 2 (two) times daily as needed for mild constipation or moderate constipation. 3350 g 1  . Vitamin D, Ergocalciferol, (DRISDOL) 50000 UNITS CAPS capsule Take 1 capsule (50,000 Units total) by mouth every 7 (seven) days. For 8 weeks (Patient not taking: Reported on 10/25/2014) 8 capsule 0     No current facility-administered medications for this visit.    Objective: Office vital signs reviewed. BP 158/92 mmHg  Pulse 103  Temp(Src) 98.3 F (36.8 C) (Oral)  Wt 233 lb (105.688 kg)   Physical Examination:  General: Awake, alert, standing up in the exam room. MSK: Normal gait and station. Tenderness to palpation in the lumbrosacral region, mostly paraspinally to the right. Some tension noted on the R paraspinal muscles.  No spinal drop offs noted.  Skin: dry, intact, no rashes or lesions Neuro: 5/5 knee flexion/extenion b/l. 4/5 hip flexion on R, 4+/5 on L. Negative SLR. Sensation intact grossly b/l. 2/4 DTRs b/l.   Assessment/Plan: Backache Patient presenting with lumbrosacral pain after lifting on Friday. Pain is worse with movement, sitting, and bending over and feels like a burning/pulling sensation. This is most likely 2/2 paraspinal muscle spasms/strain. No worrisome symptoms on history or exam (loss of bowel/bladder function, paresthesias, weakness, and negative SLR). No fevers/chills for something more concerning such as infection.  - Flexeril TID PRN  - Scheduled Ibuprofen x 5 days, then as needed  - Heat and stretches discussed - RTC precautions discussed. - Patient to RTC if she's not starting to have some improvement in the next 2 weeks, or sooner if worsening.    No orders of the defined types were placed in this encounter.    Meds ordered this encounter  Medications  . cyclobenzaprine (FLEXERIL) 10 MG tablet    Sig: Take 1 tablet (10 mg total) by mouth 3 (three) times daily as needed for muscle spasms.    Dispense:  30 tablet    Refill:  0  . ibuprofen (ADVIL,MOTRIN) 800 MG tablet    Sig: Take 1 tablet (800 mg total) by mouth 3 (three) times daily. For back for 5 days, then as needed.    Dispense:  30 tablet    Refill:  [facility], [place]

## 2015-02-07 NOTE — Patient Instructions (Signed)

## 2015-02-25 ENCOUNTER — Encounter (HOSPITAL_COMMUNITY): Payer: Self-pay | Admitting: *Deleted

## 2015-02-25 ENCOUNTER — Emergency Department (HOSPITAL_COMMUNITY): Payer: Self-pay

## 2015-02-25 ENCOUNTER — Emergency Department (HOSPITAL_COMMUNITY)
Admission: EM | Admit: 2015-02-25 | Discharge: 2015-02-25 | Disposition: A | Discharge status: Home / Self Care | Payer: Self-pay | Attending: Emergency Medicine | Admitting: Emergency Medicine

## 2015-02-25 DIAGNOSIS — E119 Type 2 diabetes mellitus without complications: Secondary | ICD-10-CM | POA: Insufficient documentation

## 2015-02-25 DIAGNOSIS — Y9389 Activity, other specified: Secondary | ICD-10-CM | POA: Insufficient documentation

## 2015-02-25 DIAGNOSIS — Y9289 Other specified places as the place of occurrence of the external cause: Secondary | ICD-10-CM | POA: Insufficient documentation

## 2015-02-25 DIAGNOSIS — Z79899 Other long term (current) drug therapy: Secondary | ICD-10-CM | POA: Insufficient documentation

## 2015-02-25 DIAGNOSIS — S3981XA Other specified injuries of abdomen, initial encounter: Secondary | ICD-10-CM

## 2015-02-25 DIAGNOSIS — Z7951 Long term (current) use of inhaled steroids: Secondary | ICD-10-CM | POA: Insufficient documentation

## 2015-02-25 DIAGNOSIS — F329 Major depressive disorder, single episode, unspecified: Secondary | ICD-10-CM | POA: Insufficient documentation

## 2015-02-25 DIAGNOSIS — S3991XA Unspecified injury of abdomen, initial encounter: Secondary | ICD-10-CM | POA: Insufficient documentation

## 2015-02-25 DIAGNOSIS — S0083XA Contusion of other part of head, initial encounter: Secondary | ICD-10-CM | POA: Insufficient documentation

## 2015-02-25 DIAGNOSIS — S299XXA Unspecified injury of thorax, initial encounter: Secondary | ICD-10-CM | POA: Insufficient documentation

## 2015-02-25 DIAGNOSIS — I10 Essential (primary) hypertension: Secondary | ICD-10-CM | POA: Insufficient documentation

## 2015-02-25 DIAGNOSIS — Y998 Other external cause status: Secondary | ICD-10-CM | POA: Insufficient documentation

## 2015-02-25 LAB — I-STAT CHEM 8, ED
BUN: 13 mg/dL (ref 6–20)
CHLORIDE: 104 mmol/L (ref 101–111)
CREATININE: 0.8 mg/dL (ref 0.44–1.00)
Calcium, Ion: 1.23 mmol/L (ref 1.12–1.23)
Glucose, Bld: 92 mg/dL (ref 65–99)
HEMATOCRIT: 44 % (ref 36.0–46.0)
Hemoglobin: 15 g/dL (ref 12.0–15.0)
Potassium: 3.9 mmol/L (ref 3.5–5.1)
Sodium: 140 mmol/L (ref 135–145)
TCO2: 25 mmol/L (ref 0–100)

## 2015-02-25 MED ORDER — IOHEXOL 350 MG/ML SOLN
100.0000 mL | Freq: Once | INTRAVENOUS | Status: DC | PRN
Start: 2015-02-25 — End: 2015-02-25

## 2015-02-25 MED ORDER — IBUPROFEN 600 MG PO TABS: 600.0000 mg | ORAL_TABLET | Freq: Four times a day (QID) | ORAL | Status: DC | PRN

## 2015-02-25 MED ORDER — HYDROCODONE-ACETAMINOPHEN 5-325 MG PO TABS
1.0000 | ORAL_TABLET | Freq: Once | ORAL | Status: AC
  Administered 2015-02-25: 1 via ORAL
  Filled 2015-02-25: qty 1

## 2015-02-25 MED ORDER — TRAMADOL HCL 50 MG PO TABS: 50.0000 mg | ORAL_TABLET | Freq: Four times a day (QID) | ORAL | Status: DC | PRN

## 2015-02-25 MED ORDER — IOHEXOL 300 MG/ML  SOLN
100.0000 mL | Freq: Once | INTRAMUSCULAR | Status: AC | PRN
  Administered 2015-02-25: 100 mL via INTRAVENOUS

## 2015-02-25 MED ORDER — MORPHINE SULFATE 4 MG/ML IJ SOLN
4.0000 mg | Freq: Once | INTRAMUSCULAR | Status: AC
  Administered 2015-02-25: 4 mg via INTRAVENOUS
  Filled 2015-02-25: qty 1

## 2015-02-25 NOTE — Discharge Instructions (Signed)

## 2015-02-25 NOTE — ED Notes (Signed)
Patient returned from CT
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
## 2015-02-27 ENCOUNTER — Encounter: Payer: Self-pay | Admitting: Family Medicine

## 2015-02-27 ENCOUNTER — Ambulatory Visit (INDEPENDENT_AMBULATORY_CARE_PROVIDER_SITE_OTHER): Payer: Self-pay | Admitting: Family Medicine

## 2015-02-27 DIAGNOSIS — R109 Unspecified abdominal pain: Secondary | ICD-10-CM

## 2015-02-27 MED ORDER — CYCLOBENZAPRINE HCL 10 MG PO TABS: 10.0000 mg | ORAL_TABLET | Freq: Three times a day (TID) | ORAL | Status: DC | PRN

## 2015-02-27 MED ORDER — NAPROXEN 500 MG PO TABS: 500.0000 mg | ORAL_TABLET | Freq: Two times a day (BID) | ORAL | Status: DC
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
## 2015-02-28 ENCOUNTER — Encounter: Payer: Self-pay | Admitting: Family Medicine

## 2015-04-30 NOTE — Progress Notes (Signed)
Certified letter sent in response to no return calls from patient.

## 2015-11-05 ENCOUNTER — Encounter: Payer: Self-pay | Admitting: Family Medicine

## 2015-11-05 ENCOUNTER — Ambulatory Visit (INDEPENDENT_AMBULATORY_CARE_PROVIDER_SITE_OTHER): Payer: Self-pay | Admitting: Family Medicine

## 2015-11-05 VITALS — BP 161/90 | HR 86 | Temp 97.8°F | Ht 67.0 in | Wt 245.4 lb

## 2015-11-05 DIAGNOSIS — M25562 Pain in left knee: Secondary | ICD-10-CM | POA: Insufficient documentation

## 2015-11-05 DIAGNOSIS — M5441 Lumbago with sciatica, right side: Secondary | ICD-10-CM | POA: Insufficient documentation

## 2015-11-05 DIAGNOSIS — E1142 Type 2 diabetes mellitus with diabetic polyneuropathy: Secondary | ICD-10-CM | POA: Insufficient documentation

## 2015-11-05 MED ORDER — PREDNISONE 10 MG PO TABS: ORAL_TABLET | ORAL | Status: DC

## 2015-11-05 MED ORDER — NAPROXEN 500 MG PO TABS: 500.0000 mg | ORAL_TABLET | Freq: Two times a day (BID) | ORAL | Status: DC

## 2015-11-05 MED ORDER — GABAPENTIN 100 MG PO CAPS: 100.0000 mg | ORAL_CAPSULE | Freq: Three times a day (TID) | ORAL | Status: DC

## 2015-11-05 MED ORDER — CYCLOBENZAPRINE HCL 10 MG PO TABS: 5.0000 mg | ORAL_TABLET | Freq: Three times a day (TID) | ORAL | Status: DC | PRN

## 2015-11-05 NOTE — Assessment & Plan Note (Signed)
Consistent with acute chronic R LBP with associated R sciatica. Suspect likely due to muscle spasm/strain, without known injury or trauma. In setting of known chronic recurrent flares LBP with DJD and prior bulging disc L4-L5. - Last imaging with L-spine MRI in 2006 (bulging disc) and L-spine X-ray 2010 (no significant DJD). No longer established with ortho. - No red flag symptoms, for other causes of LBP - Inadequate conservative therapy (out of all rx >6 months, no insurance coverage)  Plan: 1. Start with Prednisone daily taper 60mg  to 10mg  over 6 days, down by 10mg  per day. For anti-inflammatory, cautioned on side-effects prednisone. Initial concern with h/o DM but seems prior well controlled, and shortened course from a longer 7-10 day. 2. After prednisone, start anti-inflammatory trial with rx Naprosyn 500mg  BID wc x 2-4 weeks, then PRN 3. Refilled Flexeril 10mg  tabs - take 5-10mg  up to TID PRN, titrate up as tolerated 4. Refilled Gabapentin 100mg  TID, titrate up, prior for DM peripheral neuropathy - may help sciatica 5. May use Tylenol PRN for breakthrough 6. Referral to Orthopedics for Left Knee - may need to discuss LBP in future if not improving 7. RTC 1 month, re-evaluation. If not improved consider X-ray imaging given chronic LBP course. Consider trial of PT for strengthening.

## 2015-11-05 NOTE — Patient Instructions (Signed)
Thank you for coming in to clinic today.  1. For your Back Pain - I think that this is due to Muscle Spasms or strain. Your Sciatic Nerve seems to be affected causing some of your radiation and numbness down your leg 2. Start with Prednisone steroid taper 6 tabs or 60mg  for day 1, then down by 1 tablet or 10mg  each day for total of 6 days until finished, take with food 3. AFTER predniosone start with anti-inflammatory Naprosyn (Naproxen) 500mg  twice daily (12 hrs apart, with food, breakfast and dinner) every day for next 2 to 4 weeks if helping, then can use only as needed 3. Start Cyclobenzapine (Flexeril) 10mg  tablets - cut in half for 5mg  at night for muscle relaxant - may make you sedated or sleepy (be careful driving or working on this) if tolerated you can take every 8 hours, half or whole tab 4. May use Tylenol Extra Str 500mg  tabs - may take 1-2 tablets every 6 hours as needed 5. Recommend to start using heating pad on your lower back 1-2x daily for few weeks (only use for brief episodes)  This pain may take weeks to months to fully resolve, but hopefully it will respond to the medicine initially. All back injuries (small or serious) are slow to heal since we use our back muscles every day. Be careful with turning, twisting, lifting, sitting / standing for prolonged periods, and avoid re-injury.  If your symptoms significantly worsen with more pain, or new symptoms with weakness in one or both legs, new or different shooting leg pains, numbness in legs or groin, loss of control or retention of urine or bowel movements, please call back for advice and you may need to go directly to the Emergency Department.  Referral to Orthopedics for left knee and right low back, multiple osteoarthritis in joints  Please schedule a follow-up appointment with Dr Sherril Cong within 2 to 4 weeks to follow-up Arthitis, Back and Knee Pain  If you have any other questions or concerns, please feel free to call the  clinic to contact me. You may also schedule an earlier appointment if necessary.  However, if your symptoms get significantly worse, please go to the Emergency Department to seek immediate medical attention.  Nobie Putnam, [place]

## 2015-11-05 NOTE — Assessment & Plan Note (Signed)
Uncontrolled chronic bilateral lower ext peripheral neuropathy in setting DM which has been well controlled, last A1c 6.8 in 2015. - Refilled Gabapentin (can help sciatica, and titrate back up for neuropathy) - Follow-up with PCP for A1c, and routine DM follow-up with foot exam

## 2015-11-05 NOTE — Assessment & Plan Note (Signed)
Consistent with acute on chronic Left medial knee pain, with known prior OA bilaterally L>R. No specific injury or fall. Positive mechanical symptoms with locking and stiffness. Suspect likely chronic OA with reported history prior with ortho, with possible meniscus pathology, may be degenerative meniscus if truly chronic OA, vs more subacute injury with mechanical symptoms. - No recent Knee imaging on chart  Plan: 1. Referral to Orthopedics to re-establish for chronic knee pain, deferred imaging today, discussed with patient 2. Deferred knee injection, since treating with systemic prednisone for sciatica 3. Symptomatic control with prednisone, naproxen, flexeril, can also use ice/heat PRN on knee, activity modification 4. Follow-up 4 weeks for re-evaluation if needed

## 2015-11-05 NOTE — Addendum Note (Signed)
Addended by: Olin Hauser on: 11/05/2015 03:31 PM   Modules accepted: Orders

## 2015-11-05 NOTE — Progress Notes (Signed)
Subjective:    Patient ID: Isabel Berger, female    DOB: 01-12-1966, 50 y.o.   MRN: [id number]  Isabel Berger is a 50 y.o. female presenting on 11/05/2015 for Back Pain  Patient presents for a same day appointment.  HPI  RIGHT LOW BACK PAIN / SCIATCIA, ACUTE ON CHRONIC: - Reports a chronic known history of osteoarthritis / DJD in multiple joints, including low back. Prior established with Ortho and has had imaging with X-rays and MRI >5-10 years ago. Reports h/o low Lumbar spine herniated disc in past, with known sciatica symptoms. No back surgery. Out of insurance >6 months, no medicines. - Current flare with symptoms started about 1 week ago, she tried ibuprofen 200mg  x 5 tabs (1000mg ) in AM with relief. States significantly worsened yesterday 3/13, had difficulty moving due to pain. Symptoms with Right lower back pain with radiating down R leg posteriorly to below knee with paresthesias, numbness and tingling pain. Worse with prolonged sitting or standing. - Has upcoming dance competition with her daughter, and she usually helps, upset that she can't participate now - Has been off of Gabapentin, Lyrica for 6 months, had been out of work and no insurance  LEFT KNEE PAIN, ACUTE ON CHRONIC - Chronic pain in bilateral knees for years, Left > Right. Was established with ortho previously. No recent imaging. Current flare with Left knee pain and stiffness about same time as LBP with 1 week onset, some mechanical symptoms with locking (has not fallen), worse with prolonged ambulation. - Admits occasional swelling but improves with rest - No prior knee surgery  PERIPHERAL DM NEUROPATHY, CHRONIC: - Reports DM has been well controlled, but needs to follow-up. Out of meds >6 months. Last A1c 6.8 in 2015. Was on Gabapentin.  Social History  Substance Use Topics  . Smoking status: Never Smoker   . Smokeless tobacco: Never Used  . Alcohol Use: Yes     Comment: rarely    Review of  Systems Per HPI unless specifically indicated above  Denies any fevers/chills, rash, redness, other joint pain or swelling, fall, trauma, or injury     Objective:    BP 161/90 mmHg  Pulse 86  Temp(Src) 97.8 F (36.6 C) (Oral)  Ht 5\' 7"  (1.702 m)  Wt 245 lb 6.4 oz (111.313 kg)  BMI 38.43 kg/m2  Wt Readings from Last 3 Encounters:  11/05/15 245 lb 6.4 oz (111.313 kg)  02/27/15 225 lb 8 oz (102.286 kg)  02/07/15 233 lb (105.688 kg)    Physical Exam  Constitutional: She appears well-developed and well-nourished. No distress.  Obese, well-appearing, mildly uncomfortable with LBP, cooperative  HENT:  Head: Normocephalic and atraumatic.  Cardiovascular: Normal rate and intact distal pulses.   Musculoskeletal: She exhibits edema (bilateral lowe rext non-pitting edema below knees mostly ankles L>R).  Right Low Back Inspection: No obvious deformity. Palpation: Mild R>L lumbar paraspinal muscle hypertonicity with some tenderness to palpation over lower L-spine. ROM: Reduced backwards extension and forward flexion due to pain. Special Testing: Seated SLR with Right positive for radicular pain radiating into R-leg to below knee. Strength: distal strength 5/5 in ankles and knee flex intact  Bilateral Knees Inspection: Left with mild increased surrounding soft tissue edema, not consistent with effusion. Large body habitus. Palpation: Mild +TTP Left knee only medial joint line. No crepitus on ROM. ROM: Mild reduced L knee flexion. Special Testing: Lachman / Valgus/Varus tests negative with intact ligaments (ACL, MCL, LCL). McMurray with some pain due to  manipulation but not clear popping or catching. Strength: 5/5 intact knee flex/ext, ankle dorsi/plantarflex Neurovascular: distally intact sensation light touch and pulses  Neurological: She is alert.  Skin: Skin is warm and dry. No rash noted. She is not diaphoretic.  Nursing note and vitals reviewed.      Assessment & Plan:    Problem List Items Addressed This Visit    Diabetic polyneuropathy associated with type 2 diabetes mellitus ([place])    Uncontrolled chronic bilateral lower ext peripheral neuropathy in setting DM which has been well controlled, last A1c 6.8 in 2015. - Refilled Gabapentin (can help sciatica, and titrate back up for neuropathy) - Follow-up with PCP for A1c, and routine DM follow-up with foot exam      Relevant Medications   cyclobenzaprine (FLEXERIL) 10 MG tablet   gabapentin (NEURONTIN) 100 MG capsule   Left medial knee pain    Consistent with acute on chronic Left medial knee pain, with known prior OA bilaterally L>R. No specific injury or fall. Positive mechanical symptoms with locking and stiffness. Suspect likely chronic OA with reported history prior with ortho, with possible meniscus pathology, may be degenerative meniscus if truly chronic OA, vs more subacute injury with mechanical symptoms. - No recent Knee imaging on chart  Plan: 1. Referral to Orthopedics to re-establish for chronic knee pain, deferred imaging today, discussed with patient 2. Deferred knee injection, since treating with systemic prednisone for sciatica 3. Symptomatic control with prednisone, naproxen, flexeril, can also use ice/heat PRN on knee, activity modification 4. Follow-up 4 weeks for re-evaluation if needed      Relevant Medications   naproxen (NAPROSYN) 500 MG tablet   Right-sided low back pain with sciatica - Primary    Consistent with acute chronic R LBP with associated R sciatica. Suspect likely due to muscle spasm/strain, without known injury or trauma. In setting of known chronic recurrent flares LBP with DJD and prior bulging disc L4-L5. - Last imaging with L-spine MRI in 2006 (bulging disc) and L-spine X-ray 2010 (no significant DJD). No longer established with ortho. - No red flag symptoms, for other causes of LBP - Inadequate conservative therapy (out of all rx >6 months, no insurance  coverage)  Plan: 1. Start with Prednisone daily taper 60mg  to 10mg  over 6 days, down by 10mg  per day. For anti-inflammatory, cautioned on side-effects prednisone. Initial concern with h/o DM but seems prior well controlled, and shortened course from a longer 7-10 day. 2. After prednisone, start anti-inflammatory trial with rx Naprosyn 500mg  BID wc x 2-4 weeks, then PRN 3. Refilled Flexeril 10mg  tabs - take 5-10mg  up to TID PRN, titrate up as tolerated 4. Refilled Gabapentin 100mg  TID, titrate up, prior for DM peripheral neuropathy - may help sciatica 5. May use Tylenol PRN for breakthrough 6. Referral to Orthopedics for Left Knee - may need to discuss LBP in future if not improving 7. RTC 1 month, re-evaluation. If not improved consider X-ray imaging given chronic LBP course. Consider trial of PT for strengthening.      Relevant Medications   cyclobenzaprine (FLEXERIL) 10 MG tablet   predniSONE (DELTASONE) 10 MG tablet   naproxen (NAPROSYN) 500 MG tablet      Meds ordered this encounter  Medications  . cyclobenzaprine (FLEXERIL) 10 MG tablet    Sig: Take 0.5-1 tablets (5-10 mg total) by mouth 3 (three) times daily as needed for muscle spasms.    Dispense:  30 tablet    Refill:  0  . gabapentin (  NEURONTIN) 100 MG capsule    Sig: Take 1 capsule (100 mg total) by mouth 3 (three) times daily.    Dispense:  60 capsule    Refill:  0  . predniSONE (DELTASONE) 10 MG tablet    Sig: Take 6 tabs with breakfast Day 1, 5 tabs Day 2, 4 tabs Day 3, 3 tabs Day 4, 2 tabs Day 5, 1 tab Day 6.    Dispense:  21 tablet    Refill:  0  . naproxen (NAPROSYN) 500 MG tablet    Sig: Take 1 tablet (500 mg total) by mouth 2 (two) times daily with a meal.    Dispense:  60 tablet    Refill:  0      Follow up plan: Return in about 4 weeks (around 12/03/2015) for Right LBP, Left Knee pain, Osteoarthritis.  Nobie Putnam, [place], PGY-3

## 2015-11-26 ENCOUNTER — Ambulatory Visit: Payer: Self-pay | Admitting: Family Medicine

## 2015-12-24 ENCOUNTER — Encounter (HOSPITAL_COMMUNITY): Payer: Self-pay | Admitting: Emergency Medicine

## 2015-12-24 ENCOUNTER — Emergency Department (HOSPITAL_COMMUNITY)
Admission: EM | Admit: 2015-12-24 | Discharge: 2015-12-25 | Disposition: A | Discharge status: Home / Self Care | Payer: Self-pay | Attending: Emergency Medicine | Admitting: Emergency Medicine

## 2015-12-24 DIAGNOSIS — I10 Essential (primary) hypertension: Secondary | ICD-10-CM | POA: Insufficient documentation

## 2015-12-24 DIAGNOSIS — E119 Type 2 diabetes mellitus without complications: Secondary | ICD-10-CM | POA: Insufficient documentation

## 2015-12-24 DIAGNOSIS — Z79899 Other long term (current) drug therapy: Secondary | ICD-10-CM | POA: Insufficient documentation

## 2015-12-24 DIAGNOSIS — Z791 Long term (current) use of non-steroidal anti-inflammatories (NSAID): Secondary | ICD-10-CM | POA: Insufficient documentation

## 2015-12-24 DIAGNOSIS — G8929 Other chronic pain: Secondary | ICD-10-CM | POA: Insufficient documentation

## 2015-12-24 DIAGNOSIS — M5432 Sciatica, left side: Secondary | ICD-10-CM | POA: Insufficient documentation

## 2015-12-24 DIAGNOSIS — F329 Major depressive disorder, single episode, unspecified: Secondary | ICD-10-CM | POA: Insufficient documentation

## 2015-12-24 NOTE — ED Notes (Signed)
Pt states she has chronic back pain and for the past 3 days the pain has been worse  Pt states it feels like she is having contractions in her back  Pt states she has hx of sciatica  Pt states she has been working long hours the past 2 days and has been on her feet a lot

## 2015-12-25 ENCOUNTER — Telehealth: Payer: Self-pay | Admitting: Family Medicine

## 2015-12-25 MED ORDER — PREDNISONE 10 MG PO TABS: 20.0000 mg | ORAL_TABLET | Freq: Every day | ORAL | Status: DC

## 2015-12-25 MED ORDER — KETOROLAC TROMETHAMINE 60 MG/2ML IM SOLN
60.0000 mg | Freq: Once | INTRAMUSCULAR | Status: AC
  Administered 2015-12-25: 60 mg via INTRAMUSCULAR
  Filled 2015-12-25: qty 2

## 2015-12-25 MED ORDER — HYDROCODONE-ACETAMINOPHEN 5-325 MG PO TABS: 1.0000 | ORAL_TABLET | Freq: Four times a day (QID) | ORAL | Status: DC | PRN

## 2015-12-25 MED ORDER — HYDROCODONE-ACETAMINOPHEN 5-325 MG PO TABS
2.0000 | ORAL_TABLET | Freq: Once | ORAL | Status: AC
  Administered 2015-12-25: 2 via ORAL
  Filled 2015-12-25: qty 2

## 2015-12-25 NOTE — Discharge Instructions (Signed)
Prednisone as prescribed  Hydrocodone as prescribed as needed for pain.  Follow up with your primary doctor if not improving in the next week to discuss physical therapy or further testing.   Sciatica Sciatica is pain, weakness, numbness, or tingling along the path of the sciatic nerve. The nerve starts in the lower back and runs down the back of each leg. The nerve controls the muscles in the lower leg and in the back of the knee, while also providing sensation to the back of the thigh, lower leg, and the sole of your foot. Sciatica is a symptom of another medical condition. For instance, nerve damage or certain conditions, such as a herniated disk or bone spur on the spine, pinch or put pressure on the sciatic nerve. This causes the pain, weakness, or other sensations normally associated with sciatica. Generally, sciatica only affects one side of the body. CAUSES   Herniated or slipped disc.  Degenerative disk disease.  A pain disorder involving the narrow muscle in the buttocks (piriformis syndrome).  Pelvic injury or fracture.  Pregnancy.  Tumor (rare). SYMPTOMS  Symptoms can vary from mild to very severe. The symptoms usually travel from the low back to the buttocks and down the back of the leg. Symptoms can include:  Mild tingling or dull aches in the lower back, leg, or hip.  Numbness in the back of the calf or sole of the foot.  Burning sensations in the lower back, leg, or hip.  Sharp pains in the lower back, leg, or hip.  Leg weakness.  Severe back pain inhibiting movement. These symptoms may get worse with coughing, sneezing, laughing, or prolonged sitting or standing. Also, being overweight may worsen symptoms. DIAGNOSIS  Your caregiver will perform a physical exam to look for common symptoms of sciatica. He or she may ask you to do certain movements or activities that would trigger sciatic nerve pain. Other tests may be performed to find the cause of the sciatica.  These may include:  Blood tests.  X-rays.  Imaging tests, such as an MRI or CT scan. TREATMENT  Treatment is directed at the cause of the sciatic pain. Sometimes, treatment is not necessary and the pain and discomfort goes away on its own. If treatment is needed, your caregiver may suggest:  Over-the-counter medicines to relieve pain.  Prescription medicines, such as anti-inflammatory medicine, muscle relaxants, or narcotics.  Applying heat or ice to the painful area.  Steroid injections to lessen pain, irritation, and inflammation around the nerve.  Reducing activity during periods of pain.  Exercising and stretching to strengthen your abdomen and improve flexibility of your spine. Your caregiver may suggest losing weight if the extra weight makes the back pain worse.  Physical therapy.  Surgery to eliminate what is pressing or pinching the nerve, such as a bone spur or part of a herniated disk. HOME CARE INSTRUCTIONS   Only take over-the-counter or prescription medicines for pain or discomfort as directed by your caregiver.  Apply ice to the affected area for 20 minutes, 3-4 times a day for the first 48-72 hours. Then try heat in the same way.  Exercise, stretch, or perform your usual activities if these do not aggravate your pain.  Attend physical therapy sessions as directed by your caregiver.  Keep all follow-up appointments as directed by your caregiver.  Do not wear high heels or shoes that do not provide proper support.  Check your mattress to see if it is too soft. A firm  mattress may lessen your pain and discomfort. SEEK IMMEDIATE MEDICAL CARE IF:   You lose control of your bowel or bladder (incontinence).  You have increasing weakness in the lower back, pelvis, buttocks, or legs.  You have redness or swelling of your back.  You have a burning sensation when you urinate.  You have pain that gets worse when you lie down or awakens you at night.  Your pain  is worse than you have experienced in the past.  Your pain is lasting longer than 4 weeks.  You are suddenly losing weight without reason. MAKE SURE YOU:  Understand these instructions.  Will watch your condition.  Will get help right away if you are not doing well or get worse.   This information is not intended to replace advice given to you by your health care provider. Make sure you discuss any questions you have with your health care provider.   Document Released: 08/04/2001 Document Revised: 05/01/2015 Document Reviewed: 12/20/2011 Elsevier Interactive Patient Education [employer].

## 2015-12-25 NOTE — ED Provider Notes (Signed)
CSN: [id number]     Arrival date & time 12/24/15  2332 History  By signing my name below, I, Eustaquio Maize, attest that this documentation has been prepared under the direction and in the presence of Veryl Speak, MD. Electronically Signed: Eustaquio Maize, ED Scribe. 12/25/2015. 2:41 AM.   Chief Complaint  Patient presents with  . Back Pain   Patient is a 50 y.o. female presenting with back pain. The history is provided by the patient. No language interpreter was used.  Back Pain Location:  Lumbar spine Quality:  Burning Radiates to:  Does not radiate Pain severity:  Moderate Pain is:  Same all the time Onset quality:  Gradual Duration:  2 days Timing:  Constant Progression:  Worsening Chronicity:  Chronic Context: not lifting heavy objects and not recent injury   Associated symptoms: no bladder incontinence, no bowel incontinence, no numbness, no tingling and no weakness      HPI Comments: Isabel Berger is a 50 y.o. female with PMHx chronic back pain, who presents to the Emergency Department complaining of acute on chronic, constant, burning, non-radiating lower back pain x 2 days. Pt reports that she worked long hours the past 2 days and believes this is what is causing her pain. Pt reports that she has hx of sciatic but states that this pain is worse than typical flares. While in the ED tonight a sudden onset pain shot from her left great toe up her entire left leg. Denies urinary or bowel incontinence, weakness, numbness, tingling, or any other associated symptoms.    Past Medical History  Diagnosis Date  . Hypertension   . Depression   . Diabetes mellitus    Past Surgical History  Procedure Laterality Date  . Abdominal hysterectomy    . Foot surgery  2008 x 3    Dr Meridee Score    Family History  Problem Relation Age of Onset  . Diabetes Mother   . Hypertension Mother   . Stroke Mother    Social History  Substance Use Topics  . Smoking status: Never Smoker   .  Smokeless tobacco: Never Used  . Alcohol Use: No     Comment: former   OB History    Gravida Para Term Preterm AB TAB SAB Ectopic Multiple Living   2 2 2       2      Review of Systems  Gastrointestinal: Negative for bowel incontinence.  Genitourinary: Negative for bladder incontinence.  Musculoskeletal: Positive for back pain.  Neurological: Negative for tingling, weakness and numbness.  All other systems reviewed and are negative.  Allergies  Review of patient's allergies indicates no known allergies.  Home Medications   Prior to Admission medications   Medication Sig Start Date End Date Taking? Authorizing Provider  cetirizine (ZYRTEC) 10 MG tablet Take 1 tablet (10 mg total) by mouth daily. Patient not taking: Reported on 02/25/2015 04/18/13   Frazier Richards, MD  cyclobenzaprine (FLEXERIL) 10 MG tablet Take 0.5-1 tablets (5-10 mg total) by mouth 3 (three) times daily as needed for muscle spasms. 11/05/15   Olin Hauser, DO  FLUoxetine (PROZAC) 10 MG capsule Take 1 capsule (10 mg total) by mouth daily. Patient not taking: Reported on 10/25/2014 04/18/13   Frazier Richards, MD  fluticasone [facility]) 50 MCG/ACT nasal spray Place 2 sprays into the nose daily. Patient not taking: Reported on 02/25/2015 03/25/13   Janne Napoleon, NP  gabapentin (NEURONTIN) 100 MG capsule Take 1 capsule (100  mg total) by mouth 3 (three) times daily. 11/05/15   Olin Hauser, DO  lisinopril-hydrochlorothiazide (PRINZIDE,ZESTORETIC) 20-12.5 MG per tablet Take 1 tablet by mouth daily. For blood pressure Patient not taking: Reported on 10/25/2014 04/20/13   Frazier Richards, MD  naproxen (NAPROSYN) 500 MG tablet Take 1 tablet (500 mg total) by mouth 2 (two) times daily with a meal. 11/05/15   Olin Hauser, DO  Phenylephrine-Chlorphen-DM 05-28-11.5 MG/5ML LIQD Take 5 mLs by mouth every 4 (four) hours as needed. May cause drowsiness Patient not taking: Reported on 10/25/2014 03/25/13   Janne Napoleon, NP   polyethylene glycol powder (GLYCOLAX/MIRALAX) powder Take 17 g by mouth 2 (two) times daily as needed for mild constipation or moderate constipation. Patient not taking: Reported on 02/25/2015 08/11/13   Melony Overly, MD  predniSONE (DELTASONE) 10 MG tablet Take 6 tabs with breakfast Day 1, 5 tabs Day 2, 4 tabs Day 3, 3 tabs Day 4, 2 tabs Day 5, 1 tab Day 6. 11/05/15   Olin Hauser, DO  Vitamin D, Ergocalciferol, (DRISDOL) 50000 UNITS CAPS capsule Take 1 capsule (50,000 Units total) by mouth every 7 (seven) days. For 8 weeks Patient not taking: Reported on 10/25/2014 01/10/14   Melony Overly, MD   BP 158/98 mmHg  Pulse 81  Temp(Src) 97.7 F (36.5 C) (Oral)  Resp 16  SpO2 98%   Physical Exam  Constitutional: She is oriented to person, place, and time. She appears well-developed and well-nourished. No distress.  HENT:  Head: Normocephalic and atraumatic.  Eyes: EOM are normal.  Neck: Normal range of motion.  Cardiovascular: Normal rate.   Pulmonary/Chest: Effort normal and breath sounds normal.  Musculoskeletal: Normal range of motion.  TTP in the soft tissues of the lumbosacral region, left greater than right.   Neurological: She is alert and oriented to person, place, and time.  Reflex Scores:      Patellar reflexes are 1+ on the right side and 1+ on the left side.      Achilles reflexes are 1+ on the right side and 1+ on the left side. Strength is 5/5 in both lower extremities. She is ambulatory without difficulty.   Skin: Skin is warm and dry.  Psychiatric: She has a normal mood and affect. Judgment normal.  Nursing note and vitals reviewed.   ED Course  Procedures (including critical care time)  DIAGNOSTIC STUDIES: Oxygen Saturation is 98% on RA, normal by my interpretation.    COORDINATION OF CARE: 2:35 AM-Discussed treatment plan which includes Rx Prednisone, pain medication, and NSAIDs with pt at bedside and pt agreed to plan.   Labs Review Labs Reviewed - No  data to display  Imaging Review No results found.    EKG Interpretation None      MDM   Final diagnoses:  None    Patient's presentation, exam are consistent with sciatica. Her reflexes are symmetrical, strength is normal, and she is ambulatory. She will be treated with steroids, pain medication, and follow-up with her PCP. There are no bowel or bladder complaints or other red flags that would suggest an emergent condition.   I personally performed the services described in this documentation, which was scribed in my presence. The recorded information has been reviewed and is accurate.       Veryl Speak, MD 12/25/15 [phone]

## 2015-12-25 NOTE — Telephone Encounter (Signed)
Patient would like to discuss getting OTC meds to help with her sciatica. Would like to speak to MD/nurse about this. Please call.

## 2016-01-03 ENCOUNTER — Encounter: Payer: Self-pay | Admitting: Family Medicine

## 2016-01-03 ENCOUNTER — Ambulatory Visit (INDEPENDENT_AMBULATORY_CARE_PROVIDER_SITE_OTHER): Payer: Self-pay | Admitting: Family Medicine

## 2016-01-03 VITALS — BP 150/95 | HR 77 | Temp 97.7°F | Wt 242.0 lb

## 2016-01-03 DIAGNOSIS — M545 Low back pain, unspecified: Secondary | ICD-10-CM

## 2016-01-03 DIAGNOSIS — G8929 Other chronic pain: Secondary | ICD-10-CM

## 2016-01-03 MED ORDER — HYDROCODONE-ACETAMINOPHEN 5-325 MG PO TABS: 1.0000 | ORAL_TABLET | Freq: Every day | ORAL | Status: DC | PRN

## 2016-01-03 MED ORDER — BACLOFEN 10 MG PO TABS: 5.0000 mg | ORAL_TABLET | Freq: Three times a day (TID) | ORAL | Status: DC | PRN

## 2016-01-03 NOTE — Patient Instructions (Signed)
Sent in muscle relaxer (baclofen) Checking MRI of your back Referring to back specialist Refilled pain medicine. No more than once per day, avoid use every day, not to be a chronic medication.  See handout below with stretches  Follow up with Dr. Sherril Cong in 3 weeks  Be well, Dr. Ardelia Mems  Sciatica With Rehab The sciatic nerve runs from the back down the leg and is responsible for sensation and control of the muscles in the back (posterior) side of the thigh, lower leg, and foot. Sciatica is a condition that is characterized by inflammation of this nerve.  SYMPTOMS   Signs of nerve damage, including numbness and/or weakness along the posterior side of the lower extremity.  Pain in the back of the thigh that may also travel down the leg.  Pain that worsens when sitting for long periods of time.  Occasionally, pain in the back or buttock. CAUSES  Inflammation of the sciatic nerve is the cause of sciatica. The inflammation is due to something irritating the nerve. Common sources of irritation include:  Sitting for long periods of time.  Direct trauma to the nerve.  Arthritis of the spine.  Herniated or ruptured disk.  Slipping of the vertebrae (spondylolisthesis).  Pressure from soft tissues, such as muscles or ligament-like tissue (fascia). RISK INCREASES WITH:  Sports that place pressure or stress on the spine (football or weightlifting).  Poor strength and flexibility.  Failure to warm up properly before activity.  Family history of low back pain or disk disorders.  Previous back injury or surgery.  Poor body mechanics, especially when lifting, or poor posture. PREVENTION   Warm up and stretch properly before activity.  Maintain physical fitness:  Strength, flexibility, and endurance.  Cardiovascular fitness.  Learn and use proper technique, especially with posture and lifting. When possible, have coach correct improper technique.  Avoid activities that  place stress on the spine. PROGNOSIS If treated properly, then sciatica usually resolves within 6 weeks. However, occasionally surgery is necessary.  RELATED COMPLICATIONS   Permanent nerve damage, including pain, numbness, tingle, or weakness.  Chronic back pain.  Risks of surgery: infection, bleeding, nerve damage, or damage to surrounding tissues. TREATMENT Treatment initially involves resting from any activities that aggravate your symptoms. The use of ice and medication may help reduce pain and inflammation. The use of strengthening and stretching exercises may help reduce pain with activity. These exercises may be performed at home or with referral to a therapist. A therapist may recommend further treatments, such as transcutaneous electronic nerve stimulation (TENS) or ultrasound. Your caregiver may recommend corticosteroid injections to help reduce inflammation of the sciatic nerve. If symptoms persist despite non-surgical (conservative) treatment, then surgery may be recommended. MEDICATION  If pain medication is necessary, then nonsteroidal anti-inflammatory medications, such as aspirin and ibuprofen, or other minor pain relievers, such as acetaminophen, are often recommended.  Do not take pain medication for 7 days before surgery.  Prescription pain relievers may be given if deemed necessary by your caregiver. Use only as directed and only as much as you need.  Ointments applied to the skin may be helpful.  Corticosteroid injections may be given by your caregiver. These injections should be reserved for the most serious cases, because they may only be given a certain number of times. HEAT AND COLD  Cold treatment (icing) relieves pain and reduces inflammation. Cold treatment should be applied for 10 to 15 minutes every 2 to 3 hours for inflammation and pain and immediately after  any activity that aggravates your symptoms. Use ice packs or massage the area with a piece of ice (ice  massage).  Heat treatment may be used prior to performing the stretching and strengthening activities prescribed by your caregiver, physical therapist, or athletic trainer. Use a heat pack or soak the injury in warm water. SEEK MEDICAL CARE IF:  Treatment seems to offer no benefit, or the condition worsens.  Any medications produce adverse side effects. EXERCISES  RANGE OF MOTION (ROM) AND STRETCHING EXERCISES - Sciatica Most people with sciatic will find that their symptoms worsen with either excessive bending forward (flexion) or arching at the low back (extension). The exercises which will help resolve your symptoms will focus on the opposite motion. Your physician, physical therapist or athletic trainer will help you determine which exercises will be most helpful to resolve your low back pain. Do not complete any exercises without first consulting with your clinician. Discontinue any exercises which worsen your symptoms until you speak to your clinician. If you have pain, numbness or tingling which travels down into your buttocks, leg or foot, the goal of the therapy is for these symptoms to move closer to your back and eventually resolve. Occasionally, these leg symptoms will get better, but your low back pain may worsen; this is typically an indication of progress in your rehabilitation. Be certain to be very alert to any changes in your symptoms and the activities in which you participated in the 24 hours prior to the change. Sharing this information with your clinician will allow him/her to most efficiently treat your condition. These exercises may help you when beginning to rehabilitate your injury. Your symptoms may resolve with or without further involvement from your physician, physical therapist or athletic trainer. While completing these exercises, remember:   Restoring tissue flexibility helps normal motion to return to the joints. This allows healthier, less painful movement and  activity.  An effective stretch should be held for at least 30 seconds.  A stretch should never be painful. You should only feel a gentle lengthening or release in the stretched tissue. FLEXION RANGE OF MOTION AND STRETCHING EXERCISES: STRETCH - Flexion, Single Knee to Chest   Lie on a firm bed or floor with both legs extended in front of you.  Keeping one leg in contact with the floor, bring your opposite knee to your chest. Hold your leg in place by either grabbing behind your thigh or at your knee.  Pull until you feel a gentle stretch in your low back. Hold __________ seconds.  Slowly release your grasp and repeat the exercise with the opposite side. Repeat __________ times. Complete this exercise __________ times per day.  STRETCH - Flexion, Double Knee to Chest  Lie on a firm bed or floor with both legs extended in front of you.  Keeping one leg in contact with the floor, bring your opposite knee to your chest.  Tense your stomach muscles to support your back and then lift your other knee to your chest. Hold your legs in place by either grabbing behind your thighs or at your knees.  Pull both knees toward your chest until you feel a gentle stretch in your low back. Hold __________ seconds.  Tense your stomach muscles and slowly return one leg at a time to the floor. Repeat __________ times. Complete this exercise __________ times per day.  STRETCH - Low Trunk Rotation   Lie on a firm bed or floor. Keeping your legs in front of you,  bend your knees so they are both pointed toward the ceiling and your feet are flat on the floor.  Extend your arms out to the side. This will stabilize your upper body by keeping your shoulders in contact with the floor.  Gently and slowly drop both knees together to one side until you feel a gentle stretch in your low back. Hold for __________ seconds.  Tense your stomach muscles to support your low back as you bring your knees back to the  starting position. Repeat the exercise to the other side. Repeat __________ times. Complete this exercise __________ times per day  EXTENSION RANGE OF MOTION AND FLEXIBILITY EXERCISES: STRETCH - Extension, Prone on Elbows  Lie on your stomach on the floor, a bed will be too soft. Place your palms about shoulder width apart and at the height of your head.  Place your elbows under your shoulders. If this is too painful, stack pillows under your chest.  Allow your body to relax so that your hips drop lower and make contact more completely with the floor.  Hold this position for __________ seconds.  Slowly return to lying flat on the floor. Repeat __________ times. Complete this exercise __________ times per day.  RANGE OF MOTION - Extension, Prone Press Ups  Lie on your stomach on the floor, a bed will be too soft. Place your palms about shoulder width apart and at the height of your head.  Keeping your back as relaxed as possible, slowly straighten your elbows while keeping your hips on the floor. You may adjust the placement of your hands to maximize your comfort. As you gain motion, your hands will come more underneath your shoulders.  Hold this position __________ seconds.  Slowly return to lying flat on the floor. Repeat __________ times. Complete this exercise __________ times per day.  STRENGTHENING EXERCISES - Sciatica  These exercises may help you when beginning to rehabilitate your injury. These exercises should be done near your "sweet spot." This is the neutral, low-back arch, somewhere between fully rounded and fully arched, that is your least painful position. When performed in this safe range of motion, these exercises can be used for people who have either a flexion or extension based injury. These exercises may resolve your symptoms with or without further involvement from your physician, physical therapist or athletic trainer. While completing these exercises, remember:    Muscles can gain both the endurance and the strength needed for everyday activities through controlled exercises.  Complete these exercises as instructed by your physician, physical therapist or athletic trainer. Progress with the resistance and repetition exercises only as your caregiver advises.  You may experience muscle soreness or fatigue, but the pain or discomfort you are trying to eliminate should never worsen during these exercises. If this pain does worsen, stop and make certain you are following the directions exactly. If the pain is still present after adjustments, discontinue the exercise until you can discuss the trouble with your clinician. STRENGTHENING - Deep Abdominals, Pelvic Tilt   Lie on a firm bed or floor. Keeping your legs in front of you, bend your knees so they are both pointed toward the ceiling and your feet are flat on the floor.  Tense your lower abdominal muscles to press your low back into the floor. This motion will rotate your pelvis so that your tail bone is scooping upwards rather than pointing at your feet or into the floor.  With a gentle tension and even breathing, hold  this position for __________ seconds. Repeat __________ times. Complete this exercise __________ times per day.  STRENGTHENING - Abdominals, Crunches   Lie on a firm bed or floor. Keeping your legs in front of you, bend your knees so they are both pointed toward the ceiling and your feet are flat on the floor. Cross your arms over your chest.  Slightly tip your chin down without bending your neck.  Tense your abdominals and slowly lift your trunk high enough to just clear your shoulder blades. Lifting higher can put excessive stress on the low back and does not further strengthen your abdominal muscles.  Control your return to the starting position. Repeat __________ times. Complete this exercise __________ times per day.  STRENGTHENING - Quadruped, Opposite UE/LE Lift  Assume a  hands and knees position on a firm surface. Keep your hands under your shoulders and your knees under your hips. You may place padding under your knees for comfort.  Find your neutral spine and gently tense your abdominal muscles so that you can maintain this position. Your shoulders and hips should form a rectangle that is parallel with the floor and is not twisted.  Keeping your trunk steady, lift your right hand no higher than your shoulder and then your left leg no higher than your hip. Make sure you are not holding your breath. Hold this position __________ seconds.  Continuing to keep your abdominal muscles tense and your back steady, slowly return to your starting position. Repeat with the opposite arm and leg. Repeat __________ times. Complete this exercise __________ times per day.  STRENGTHENING - Abdominals and Quadriceps, Straight Leg Raise   Lie on a firm bed or floor with both legs extended in front of you.  Keeping one leg in contact with the floor, bend the other knee so that your foot can rest flat on the floor.  Find your neutral spine, and tense your abdominal muscles to maintain your spinal position throughout the exercise.  Slowly lift your straight leg off the floor about 6 inches for a count of 15, making sure to not hold your breath.  Still keeping your neutral spine, slowly lower your leg all the way to the floor. Repeat this exercise with each leg __________ times. Complete this exercise __________ times per day. POSTURE AND BODY MECHANICS CONSIDERATIONS - Sciatica Keeping correct posture when sitting, standing or completing your activities will reduce the stress put on different body tissues, allowing injured tissues a chance to heal and limiting painful experiences. The following are general guidelines for improved posture. Your physician or physical therapist will provide you with any instructions specific to your needs. While reading these guidelines,  remember:  The exercises prescribed by your provider will help you have the flexibility and strength to maintain correct postures.  The correct posture provides the optimal environment for your joints to work. All of your joints have less wear and tear when properly supported by a spine with good posture. This means you will experience a healthier, less painful body.  Correct posture must be practiced with all of your activities, especially prolonged sitting and standing. Correct posture is as important when doing repetitive low-stress activities (typing) as it is when doing a single heavy-load activity (lifting). RESTING POSITIONS Consider which positions are most painful for you when choosing a resting position. If you have pain with flexion-based activities (sitting, bending, stooping, squatting), choose a position that allows you to rest in a less flexed posture. You would want to  avoid curling into a fetal position on your side. If your pain worsens with extension-based activities (prolonged standing, working overhead), avoid resting in an extended position such as sleeping on your stomach. Most people will find more comfort when they rest with their spine in a more neutral position, neither too rounded nor too arched. Lying on a non-sagging bed on your side with a pillow between your knees, or on your back with a pillow under your knees will often provide some relief. Keep in mind, being in any one position for a prolonged period of time, no matter how correct your posture, can still lead to stiffness. PROPER SITTING POSTURE In order to minimize stress and discomfort on your spine, you must sit with correct posture Sitting with good posture should be effortless for a healthy body. Returning to good posture is a gradual process. Many people can work toward this most comfortably by using various supports until they have the flexibility and strength to maintain this posture on their own. When sitting  with proper posture, your ears will fall over your shoulders and your shoulders will fall over your hips. You should use the back of the chair to support your upper back. Your low back will be in a neutral position, just slightly arched. You may place a small pillow or folded towel at the base of your low back for support.  When working at a desk, create an environment that supports good, upright posture. Without extra support, muscles fatigue and lead to excessive strain on joints and other tissues. Keep these recommendations in mind: CHAIR:   A chair should be able to slide under your desk when your back makes contact with the back of the chair. This allows you to work closely.  The chair's height should allow your eyes to be level with the upper part of your monitor and your hands to be slightly lower than your elbows. BODY POSITION  Your feet should make contact with the floor. If this is not possible, use a foot rest.  Keep your ears over your shoulders. This will reduce stress on your neck and low back. INCORRECT SITTING POSTURES   If you are feeling tired and unable to assume a healthy sitting posture, do not slouch or slump. This puts excessive strain on your back tissues, causing more damage and pain. Healthier options include:  Using more support, like a lumbar pillow.  Switching tasks to something that requires you to be upright or walking.  Talking a brief walk.  Lying down to rest in a neutral-spine position. PROLONGED STANDING WHILE SLIGHTLY LEANING FORWARD  When completing a task that requires you to lean forward while standing in one place for a long time, place either foot up on a stationary 2-4 inch high object to help maintain the best posture. When both feet are on the ground, the low back tends to lose its slight inward curve. If this curve flattens (or becomes too large), then the back and your other joints will experience too much stress, fatigue more quickly and can  cause pain.  CORRECT STANDING POSTURES Proper standing posture should be assumed with all daily activities, even if they only take a few moments, like when brushing your teeth. As in sitting, your ears should fall over your shoulders and your shoulders should fall over your hips. You should keep a slight tension in your abdominal muscles to brace your spine. Your tailbone should point down to the ground, not behind your body, resulting  in an over-extended swayback posture.  INCORRECT STANDING POSTURES  Common incorrect standing postures include a forward head, locked knees and/or an excessive swayback. WALKING Walk with an upright posture. Your ears, shoulders and hips should all line-up. PROLONGED ACTIVITY IN A FLEXED POSITION When completing a task that requires you to bend forward at your waist or lean over a low surface, try to find a way to stabilize 3 of 4 of your limbs. You can place a hand or elbow on your thigh or rest a knee on the surface you are reaching across. This will provide you more stability so that your muscles do not fatigue as quickly. By keeping your knees relaxed, or slightly bent, you will also reduce stress across your low back. CORRECT LIFTING TECHNIQUES DO :   Assume a wide stance. This will provide you more stability and the opportunity to get as close as possible to the object which you are lifting.  Tense your abdominals to brace your spine; then bend at the knees and hips. Keeping your back locked in a neutral-spine position, lift using your leg muscles. Lift with your legs, keeping your back straight.  Test the weight of unknown objects before attempting to lift them.  Try to keep your elbows locked down at your sides in order get the best strength from your shoulders when carrying an object.  Always ask for help when lifting heavy or awkward objects. INCORRECT LIFTING TECHNIQUES DO NOT:   Lock your knees when lifting, even if it is a small object.  Bend and  twist. Pivot at your feet or move your feet when needing to change directions.  Assume that you cannot safely pick up a paperclip without proper posture.   This information is not intended to replace advice given to you by your health care provider. Make sure you discuss any questions you have with your health care provider.   Document Released: 08/10/2005 Document Revised: 12/25/2014 Document Reviewed: 11/22/2008 Elsevier Interactive Patient Education [employer].
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
## 2016-01-08 ENCOUNTER — Telehealth: Payer: Self-pay | Admitting: Family Medicine

## 2016-01-08 NOTE — Telephone Encounter (Signed)
LMOVM. MRI of back scheduled for 01/16/16 @ 2:00 PM at [facility] @ 1:45 PM  If this does not work, she can call [phone] to reschedule.

## 2016-01-16 ENCOUNTER — Ambulatory Visit (HOSPITAL_COMMUNITY)
Admission: RE | Admit: 2016-01-16 | Discharge: 2016-01-16 | Disposition: A | Discharge status: Home / Self Care | Payer: Self-pay | Source: Ambulatory Visit | Attending: Family Medicine | Admitting: Family Medicine

## 2016-01-16 ENCOUNTER — Telehealth: Payer: Self-pay | Admitting: Family Medicine

## 2016-01-16 DIAGNOSIS — M545 Low back pain, unspecified: Secondary | ICD-10-CM

## 2016-01-16 DIAGNOSIS — G8929 Other chronic pain: Secondary | ICD-10-CM

## 2016-01-16 MED ORDER — ALPRAZOLAM 0.5 MG PO TABS: 0.5000 mg | ORAL_TABLET | Freq: Once | ORAL | Status: DC

## 2016-01-16 NOTE — Telephone Encounter (Signed)
Patient asks for Xanax prescription in order to get a MRI done on Wednesday 05-31. Please, follow up.

## 2016-01-16 NOTE — Telephone Encounter (Signed)
I will print an rx and place at front desk for her to pick up. Please inform her and also advised that she will need to have someone drive her to and from the appointment.  Leeanne Rio, MD

## 2016-01-16 NOTE — Telephone Encounter (Signed)
Will forward to ordering MD.

## 2016-01-17 NOTE — Telephone Encounter (Signed)
Left message on voicemail informing that rx was ready to be picked up. 

## 2016-01-22 ENCOUNTER — Ambulatory Visit (HOSPITAL_COMMUNITY): Admission: RE | Admit: 2016-01-22 | Payer: Self-pay | Source: Ambulatory Visit

## 2016-01-23 ENCOUNTER — Encounter: Payer: Self-pay | Admitting: Internal Medicine

## 2016-01-23 ENCOUNTER — Ambulatory Visit (INDEPENDENT_AMBULATORY_CARE_PROVIDER_SITE_OTHER): Payer: Self-pay | Admitting: Internal Medicine

## 2016-01-23 VITALS — BP 154/93 | HR 96 | Temp 98.2°F | Ht 64.0 in | Wt 242.0 lb

## 2016-01-23 DIAGNOSIS — E1142 Type 2 diabetes mellitus with diabetic polyneuropathy: Secondary | ICD-10-CM

## 2016-01-23 DIAGNOSIS — M25521 Pain in right elbow: Secondary | ICD-10-CM

## 2016-01-23 DIAGNOSIS — E119 Type 2 diabetes mellitus without complications: Secondary | ICD-10-CM

## 2016-01-23 DIAGNOSIS — M5441 Lumbago with sciatica, right side: Secondary | ICD-10-CM

## 2016-01-23 DIAGNOSIS — M25562 Pain in left knee: Secondary | ICD-10-CM

## 2016-01-23 LAB — POCT GLYCOSYLATED HEMOGLOBIN (HGB A1C): HEMOGLOBIN A1C: 6.8

## 2016-01-23 MED ORDER — DICLOFENAC SODIUM 1 % TD GEL: 2.0000 g | Freq: Four times a day (QID) | TRANSDERMAL | Status: DC

## 2016-01-23 MED ORDER — NAPROXEN 500 MG PO TABS: 500.0000 mg | ORAL_TABLET | Freq: Two times a day (BID) | ORAL | Status: DC

## 2016-01-23 MED ORDER — GABAPENTIN 100 MG PO CAPS: 100.0000 mg | ORAL_CAPSULE | Freq: Three times a day (TID) | ORAL | Status: DC

## 2016-01-23 NOTE — Progress Notes (Signed)
   Subjective:    Patient ID: Isabel Berger, female    DOB: 1966/02/26, 50 y.o.   MRN: [id number]  HPI  Patient presents for elbow pain.   R elbow pain Patient reports pain in her R elbow worsening over the past two weeks. The pain is worsened by repetitive movement or prolonged inactivity. She has not found anything that alleviates the pain. She was previously taking Naprosyn, baclofen, gabapentin, and Norco for sciatica, though this did not improve her elbow pain. She has been out of these medications for the past week or so. She also tried a heating pad and ice pack, with no improvement. 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 She has a history of cysts in other body parts and is concerned this may be a cyst as well. Endorses warmth to the touch at times, but denies erythema. Denies history of trauma to the elbow. Endorses some tingling at times when trying to straighten her arm. Denies cracking, popping, or any other abnormal sensation when moving her arm.   Review of Systems See HPI.     Objective:   Physical Exam  Constitutional: She is oriented to person, place, and time. She appears well-developed and well-nourished. No distress.  Musculoskeletal:       Arms: Neurological: She is alert and oriented to person, place, and time.  Skin: Skin is warm and dry.  Psychiatric: She has a normal mood and affect. Her behavior is normal.      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 Less likely cyst, as there is no mass or demarcation of a cystic lesion. Gout also on differential.  - Continue Naprosyn 500mg  BID (wrote refill) - Heating pad  - Prescribed Voltaren gel 2g up to QID - Provided strengthening and stretching exercise handouts - Instructed to return in  two weeks if no improvement in symptoms   Adin Hector, MD PGY-1 [facility] Pager [phone]

## 2016-01-23 NOTE — Patient Instructions (Addendum)
It was nice meeting you today [name]!  Your elbow pain seems most consistent with lateral epicondylitis ("tennis elbow"), which is caused by inflammation usually due to increased use. Please continue to take Naprosyn 500mg  twice a day, as well as gabapentin 100mg  three times a day. I have also written a prescription for Voltaren gel. You can apply this to your elbow up to four times a day. A heating pad or warm compress may be helpful in reducing inflammation, as well as performing the exercises below.   If your pain worsens severely or has not improved in two weeks, please call the office to schedule another appointment.    Be well,  Dr. Avon Gully  Lateral Epicondylitis With Rehab Lateral epicondylitis involves inflammation and pain around the outer portion of the elbow. The pain is caused by inflammation of the tendons in the forearm that bring back (extend) the wrist. Lateral epicondylitis is also called tennis elbow, because it is very common in tennis players. However, it may occur in any individual who extends the wrist repetitively. If lateral epicondylitis is left untreated, it may become a chronic problem. SYMPTOMS   Pain, tenderness, and inflammation on the outer (lateral) side of the elbow.  Pain or weakness with gripping activities.  Pain that increases with wrist-twisting motions (playing tennis, using a screwdriver, opening a door or a jar).  Pain with lifting objects, including a coffee cup. CAUSES  Lateral epicondylitis is caused by inflammation of the tendons that extend the wrist. Causes of injury may include:  Repetitive stress and strain on the muscles and tendons that extend the wrist.  Sudden change in activity level or intensity.  Incorrect grip in racquet sports.  Incorrect grip size of racquet (often too large).  Incorrect hitting position or technique (usually backhand, leading with the elbow).  Using a racket that is too heavy. RISK INCREASES  WITH:  Sports or occupations that require repetitive and/or strenuous forearm and wrist movements (tennis, squash, racquetball, carpentry).  Poor wrist and forearm strength and flexibility.  Failure to warm up properly before activity.  Resuming activity before healing, rehabilitation, and conditioning are complete. PREVENTION   Warm up and stretch properly before activity.  Maintain physical fitness:  Strength, flexibility, and endurance.  Cardiovascular fitness.  Wear and use properly fitted equipment.  Learn and use proper technique and have a coach correct improper technique.  Wear a tennis elbow (counterforce) brace. PROGNOSIS  The course of this condition depends on the degree of the injury. If treated properly, acute cases (symptoms lasting less than 4 weeks) are often resolved in 2 to 6 weeks. Chronic (longer lasting cases) often resolve in 3 to 6 months but may require physical therapy. RELATED COMPLICATIONS   Frequently recurring symptoms, resulting in a chronic problem. Properly treating the problem the first time decreases frequency of recurrence.  Chronic inflammation, scarring tendon degeneration, and partial tendon tear, requiring surgery.  Delayed healing or resolution of symptoms. TREATMENT  Treatment first involves the use of ice and medicine to reduce pain and inflammation. Strengthening and stretching exercises may help reduce discomfort if performed regularly. These exercises may be performed at home if the condition is an acute injury. Chronic cases may require a referral to a physical therapist for evaluation and treatment. Your caregiver may advise a corticosteroid injection to help reduce inflammation. Rarely, surgery is needed. MEDICATION  If pain medicine is needed, nonsteroidal anti-inflammatory medicines (aspirin and ibuprofen), or other minor pain relievers (acetaminophen), are often advised.  Do not take pain medicine for 7 days before  surgery.  Prescription pain relievers may be given, if your caregiver thinks they are needed. Use only as directed and only as much as you need.  Corticosteroid injections may be recommended. These injections should be reserved only for the most severe cases, because they can only be given a certain number of times. HEAT AND COLD  Cold treatment (icing) should be applied for 10 to 15 minutes every 2 to 3 hours for inflammation and pain, and immediately after activity that aggravates your symptoms. Use ice packs or an ice massage.  Heat treatment may be used before performing stretching and strengthening activities prescribed by your caregiver, physical therapist, or athletic trainer. Use a heat pack or a warm water soak. SEEK MEDICAL CARE IF: Symptoms get worse or do not improve in 2 weeks, despite treatment. EXERCISES  RANGE OF MOTION (ROM) AND STRETCHING EXERCISES - Epicondylitis, Lateral (Tennis Elbow) These exercises may help you when beginning to rehabilitate your injury. Your symptoms may go away with or without further involvement from your physician, physical therapist, or athletic trainer. While completing these exercises, remember:   Restoring tissue flexibility helps normal motion to return to the joints. This allows healthier, less painful movement and activity.  An effective stretch should be held for at least 30 seconds.  A stretch should never be painful. You should only feel a gentle lengthening or release in the stretched tissue. RANGE OF MOTION - Wrist Flexion, Active-Assisted  Extend your right / left elbow with your fingers pointing down.*  Gently pull the back of your hand towards you, until you feel a gentle stretch on the top of your forearm.  Hold this position for __________ seconds. Repeat __________ times. Complete this exercise __________ times per day.  *If directed by your physician, physical therapist or athletic trainer, complete this stretch with your  elbow bent, rather than extended. RANGE OF MOTION - Wrist Extension, Active-Assisted  Extend your right / left elbow and turn your palm upwards.*  Gently pull your palm and fingertips back, so your wrist extends and your fingers point more toward the ground.  You should feel a gentle stretch on the inside of your forearm.  Hold this position for __________ seconds. Repeat __________ times. Complete this exercise __________ times per day. *If directed by your physician, physical therapist or athletic trainer, complete this stretch with your elbow bent, rather than extended. STRETCH - Wrist Flexion  Place the back of your right / left hand on a tabletop, leaving your elbow slightly bent. Your fingers should point away from your body.  Gently press the back of your hand down onto the table by straightening your elbow. You should feel a stretch on the top of your forearm.  Hold this position for __________ seconds. Repeat __________ times. Complete this stretch __________ times per day.  STRETCH - Wrist Extension   Place your right / left fingertips on a tabletop, leaving your elbow slightly bent. Your fingers should point backwards.  Gently press your fingers and palm down onto the table by straightening your elbow. You should feel a stretch on the inside of your forearm.  Hold this position for __________ seconds. Repeat __________ times. Complete this stretch __________ times per day.  STRENGTHENING EXERCISES - Epicondylitis, Lateral (Tennis Elbow) These exercises may help you when beginning to rehabilitate your injury. They may resolve your symptoms with or without further involvement from your physician, physical therapist, or athletic trainer. While  completing these exercises, remember:   Muscles can gain both the endurance and the strength needed for everyday activities through controlled exercises.  Complete these exercises as instructed by your physician, physical therapist or  athletic trainer. Increase the resistance and repetitions only as guided.  You may experience muscle soreness or fatigue, but the pain or discomfort you are trying to eliminate should never worsen during these exercises. If this pain does get worse, stop and make sure you are following the directions exactly. If the pain is still present after adjustments, discontinue the exercise until you can discuss the trouble with your caregiver. STRENGTH - Wrist Flexors  Sit with your right / left forearm palm-up and fully supported on a table or countertop. Your elbow should be resting below the height of your shoulder. Allow your wrist to extend over the edge of the surface.  Loosely holding a __________ weight, or a piece of rubber exercise band or tubing, slowly curl your hand up toward your forearm.  Hold this position for __________ seconds. Slowly lower the wrist back to the starting position in a controlled manner. Repeat __________ times. Complete this exercise __________ times per day.  STRENGTH - Wrist Extensors  Sit with your right / left forearm palm-down and fully supported on a table or countertop. Your elbow should be resting below the height of your shoulder. Allow your wrist to extend over the edge of the surface.  Loosely holding a __________ weight, or a piece of rubber exercise band or tubing, slowly curl your hand up toward your forearm.  Hold this position for __________ seconds. Slowly lower the wrist back to the starting position in a controlled manner. Repeat __________ times. Complete this exercise __________ times per day.  STRENGTH - Ulnar Deviators  Stand with a ____________________ weight in your right / left hand, or sit while holding a rubber exercise band or tubing, with your healthy arm supported on a table or countertop.  Move your wrist, so that your pinkie travels toward your forearm and your thumb moves away from your forearm.  Hold this position for __________  seconds and then slowly lower the wrist back to the starting position. Repeat __________ times. Complete this exercise __________ times per day STRENGTH - Radial Deviators  Stand with a ____________________ weight in your right / left hand, or sit while holding a rubber exercise band or tubing, with your injured arm supported on a table or countertop.  Raise your hand upward in front of you or pull up on the rubber tubing.  Hold this position for __________ seconds and then slowly lower the wrist back to the starting position. Repeat __________ times. Complete this exercise __________ times per day. STRENGTH - Forearm Supinators   Sit with your right / left forearm supported on a table, keeping your elbow below shoulder height. Rest your hand over the edge, palm down.  Gently grip a hammer or a soup ladle.  Without moving your elbow, slowly turn your palm and hand upward to a "thumbs-up" position.  Hold this position for __________ seconds. Slowly return to the starting position. Repeat __________ times. Complete this exercise __________ times per day.  STRENGTH - Forearm Pronators   Sit with your right / left forearm supported on a table, keeping your elbow below shoulder height. Rest your hand over the edge, palm up.  Gently grip a hammer or a soup ladle.  Without moving your elbow, slowly turn your palm and hand upward to a "thumbs-up" position.  Hold this position for __________ seconds. Slowly return to the starting position. Repeat __________ times. Complete this exercise __________ times per day.  STRENGTH - Elbow Extensors, Isometric  Stand or sit upright, on a firm surface. Place your right / left arm so that your palm faces your stomach, and it is at the height of your waist.  Place your opposite hand on the underside of your forearm. Gently push up as your right / left arm resists. Push as hard as you can with both arms, without causing any pain or movement at your right  / left elbow. Hold this stationary position for __________ seconds. Gradually release the tension in both arms. Allow your muscles to relax completely before repeating.   This information is not intended to replace advice given to you by your health care provider. Make sure you discuss any questions you have with your health care provider.   Document Released: 08/10/2005 Document Revised: 08/31/2014 Document Reviewed: 11/22/2008 Elsevier Interactive Patient Education [employer].

## 2016-01-23 NOTE — Assessment & Plan Note (Signed)
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 Less likely cyst, as there is no mass or demarcation of a cystic lesion. Gout also on differential.  - Continue Naprosyn 500mg  BID (wrote refill) - Heating pad  - Prescribed Voltaren gel 2g up to QID - Provided strengthening and stretching exercise handouts - Instructed to return in two weeks if no improvement in symptoms

## 2016-01-30 ENCOUNTER — Ambulatory Visit (HOSPITAL_COMMUNITY)
Admission: RE | Admit: 2016-01-30 | Discharge: 2016-01-30 | Disposition: A | Discharge status: Home / Self Care | Payer: Self-pay | Source: Ambulatory Visit | Attending: Family Medicine | Admitting: Family Medicine

## 2016-01-30 DIAGNOSIS — M47896 Other spondylosis, lumbar region: Secondary | ICD-10-CM | POA: Insufficient documentation

## 2016-01-30 DIAGNOSIS — M5136 Other intervertebral disc degeneration, lumbar region: Secondary | ICD-10-CM | POA: Insufficient documentation

## 2016-01-30 DIAGNOSIS — G8929 Other chronic pain: Secondary | ICD-10-CM | POA: Insufficient documentation

## 2016-01-30 DIAGNOSIS — M545 Low back pain: Secondary | ICD-10-CM | POA: Insufficient documentation

## 2016-01-30 DIAGNOSIS — M5137 Other intervertebral disc degeneration, lumbosacral region: Secondary | ICD-10-CM | POA: Insufficient documentation

## 2016-02-04 ENCOUNTER — Telehealth: Payer: Self-pay | Admitting: *Deleted

## 2016-02-04 NOTE — Telephone Encounter (Signed)
Patient calling to get results of MRI done 6/8, upset that she has'nt heard anything yet. Will forward to PCP and ordering MD.

## 2016-02-04 NOTE — Telephone Encounter (Signed)
Called patient & discussed results with her. Recommend follow up with neurosurgery for the MRI findings (degenerative changes, nerve impingements at various levels). She reports her neurosurgery appointment was rescheduled a couple of times. Encouraged follow up with [facility] - she will call to see about getting in there sooner. Patient appreciative.  Leeanne Rio, MD

## 2016-04-15 ENCOUNTER — Ambulatory Visit
Admission: RE | Admit: 2016-04-15 | Discharge: 2016-04-15 | Disposition: A | Discharge status: Home / Self Care | Payer: No Typology Code available for payment source | Source: Ambulatory Visit | Attending: Family Medicine | Admitting: Family Medicine

## 2016-04-15 ENCOUNTER — Ambulatory Visit (INDEPENDENT_AMBULATORY_CARE_PROVIDER_SITE_OTHER): Payer: Self-pay | Admitting: Family Medicine

## 2016-04-15 ENCOUNTER — Encounter: Payer: Self-pay | Admitting: Family Medicine

## 2016-04-15 VITALS — BP 154/92 | HR 79 | Temp 98.0°F | Wt 239.0 lb

## 2016-04-15 DIAGNOSIS — M5441 Lumbago with sciatica, right side: Secondary | ICD-10-CM

## 2016-04-15 DIAGNOSIS — M25521 Pain in right elbow: Secondary | ICD-10-CM

## 2016-04-15 DIAGNOSIS — M25562 Pain in left knee: Secondary | ICD-10-CM

## 2016-04-15 MED ORDER — NAPROXEN 500 MG PO TABS: 500.0000 mg | ORAL_TABLET | Freq: Two times a day (BID) | ORAL | 1 refills | Status: DC
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
## 2016-04-15 NOTE — Patient Instructions (Signed)
I have ordered an x-ray of your elbow I have referred you to [facility].' This still sounds like tennis elbow which can unfortunately take a long time to recover. The orthopedist may be able to inject your elbow to see if there is relief.    Lateral Epicondylitis With Rehab Lateral epicondylitis involves inflammation and pain around the outer portion of the elbow. The pain is caused by inflammation of the tendons in the forearm that bring back (extend) the wrist. Lateral epicondylitis is also called tennis elbow, because it is very common in tennis players. However, it may occur in any individual who extends the wrist repetitively. If lateral epicondylitis is left untreated, it may become a chronic problem. SYMPTOMS   Pain, tenderness, and inflammation on the outer (lateral) side of the elbow.  Pain or weakness with gripping activities.  Pain that increases with wrist-twisting motions (playing tennis, using a screwdriver, opening a door or a jar).  Pain with lifting objects, including a coffee cup. CAUSES  Lateral epicondylitis is caused by inflammation of the tendons that extend the wrist. Causes of injury may include:  Repetitive stress and strain on the muscles and tendons that extend the wrist.  Sudden change in activity level or intensity.  Incorrect grip in racquet sports.  Incorrect grip size of racquet (often too large).  Incorrect hitting position or technique (usually backhand, leading with the elbow).  Using a racket that is too heavy. RISK INCREASES WITH:  Sports or occupations that require repetitive and/or strenuous forearm and wrist movements (tennis, squash, racquetball, carpentry).  Poor wrist and forearm strength and flexibility.  Failure to warm up properly before activity.  Resuming activity before healing, rehabilitation, and conditioning are complete. PREVENTION   Warm up and stretch properly before activity.  Maintain physical  fitness:  Strength, flexibility, and endurance.  Cardiovascular fitness.  Wear and use properly fitted equipment.  Learn and use proper technique and have a coach correct improper technique.  Wear a tennis elbow (counterforce) brace. PROGNOSIS  The course of this condition depends on the degree of the injury. If treated properly, acute cases (symptoms lasting less than 4 weeks) are often resolved in 2 to 6 weeks. Chronic (longer lasting cases) often resolve in 3 to 6 months but may require physical therapy. RELATED COMPLICATIONS   Frequently recurring symptoms, resulting in a chronic problem. Properly treating the problem the first time decreases frequency of recurrence.  Chronic inflammation, scarring tendon degeneration, and partial tendon tear, requiring surgery.  Delayed healing or resolution of symptoms. TREATMENT  Treatment first involves the use of ice and medicine to reduce pain and inflammation. Strengthening and stretching exercises may help reduce discomfort if performed regularly. These exercises may be performed at home if the condition is an acute injury. Chronic cases may require a referral to a physical therapist for evaluation and treatment. Your caregiver may advise a corticosteroid injection to help reduce inflammation. Rarely, surgery is needed. MEDICATION  If pain medicine is needed, nonsteroidal anti-inflammatory medicines (aspirin and ibuprofen), or other minor pain relievers (acetaminophen), are often advised.  Do not take pain medicine for 7 days before surgery.  Prescription pain relievers may be given, if your caregiver thinks they are needed. Use only as directed and only as much as you need.  Corticosteroid injections may be recommended. These injections should be reserved only for the most severe cases, because they can only be given a certain number of times. HEAT AND COLD  Cold treatment (icing) should be  applied for 10 to 15 minutes every 2 to 3 hours  for inflammation and pain, and immediately after activity that aggravates your symptoms. Use ice packs or an ice massage.  Heat treatment may be used before performing stretching and strengthening activities prescribed by your caregiver, physical therapist, or athletic trainer. Use a heat pack or a warm water soak. SEEK MEDICAL CARE IF: Symptoms get worse or do not improve in 2 weeks, despite treatment. EXERCISES  RANGE OF MOTION (ROM) AND STRETCHING EXERCISES - Epicondylitis, Lateral (Tennis Elbow) These exercises may help you when beginning to rehabilitate your injury. Your symptoms may go away with or without further involvement from your physician, physical therapist, or athletic trainer. While completing these exercises, remember:   Restoring tissue flexibility helps normal motion to return to the joints. This allows healthier, less painful movement and activity.  An effective stretch should be held for at least 30 seconds.  A stretch should never be painful. You should only feel a gentle lengthening or release in the stretched tissue. RANGE OF MOTION - Wrist Flexion, Active-Assisted  Extend your right / left elbow with your fingers pointing down.*  Gently pull the back of your hand towards you, until you feel a gentle stretch on the top of your forearm.  Hold this position for __________ seconds. Repeat __________ times. Complete this exercise __________ times per day.  *If directed by your physician, physical therapist or athletic trainer, complete this stretch with your elbow bent, rather than extended. RANGE OF MOTION - Wrist Extension, Active-Assisted  Extend your right / left elbow and turn your palm upwards.*  Gently pull your palm and fingertips back, so your wrist extends and your fingers point more toward the ground.  You should feel a gentle stretch on the inside of your forearm.  Hold this position for __________ seconds. Repeat __________ times. Complete this  exercise __________ times per day. *If directed by your physician, physical therapist or athletic trainer, complete this stretch with your elbow bent, rather than extended. STRETCH - Wrist Flexion  Place the back of your right / left hand on a tabletop, leaving your elbow slightly bent. Your fingers should point away from your body.  Gently press the back of your hand down onto the table by straightening your elbow. You should feel a stretch on the top of your forearm.  Hold this position for __________ seconds. Repeat __________ times. Complete this stretch __________ times per day.  STRETCH - Wrist Extension   Place your right / left fingertips on a tabletop, leaving your elbow slightly bent. Your fingers should point backwards.  Gently press your fingers and palm down onto the table by straightening your elbow. You should feel a stretch on the inside of your forearm.  Hold this position for __________ seconds. Repeat __________ times. Complete this stretch __________ times per day.  STRENGTHENING EXERCISES - Epicondylitis, Lateral (Tennis Elbow) These exercises may help you when beginning to rehabilitate your injury. They may resolve your symptoms with or without further involvement from your physician, physical therapist, or athletic trainer. While completing these exercises, remember:   Muscles can gain both the endurance and the strength needed for everyday activities through controlled exercises.  Complete these exercises as instructed by your physician, physical therapist or athletic trainer. Increase the resistance and repetitions only as guided.  You may experience muscle soreness or fatigue, but the pain or discomfort you are trying to eliminate should never worsen during these exercises. If this pain does  get worse, stop and make sure you are following the directions exactly. If the pain is still present after adjustments, discontinue the exercise until you can discuss the  trouble with your caregiver. STRENGTH - Wrist Flexors  Sit with your right / left forearm palm-up and fully supported on a table or countertop. Your elbow should be resting below the height of your shoulder. Allow your wrist to extend over the edge of the surface.  Loosely holding a __________ weight, or a piece of rubber exercise band or tubing, slowly curl your hand up toward your forearm.  Hold this position for __________ seconds. Slowly lower the wrist back to the starting position in a controlled manner. Repeat __________ times. Complete this exercise __________ times per day.  STRENGTH - Wrist Extensors  Sit with your right / left forearm palm-down and fully supported on a table or countertop. Your elbow should be resting below the height of your shoulder. Allow your wrist to extend over the edge of the surface.  Loosely holding a __________ weight, or a piece of rubber exercise band or tubing, slowly curl your hand up toward your forearm.  Hold this position for __________ seconds. Slowly lower the wrist back to the starting position in a controlled manner. Repeat __________ times. Complete this exercise __________ times per day.  STRENGTH - Ulnar Deviators  Stand with a ____________________ weight in your right / left hand, or sit while holding a rubber exercise band or tubing, with your healthy arm supported on a table or countertop.  Move your wrist, so that your pinkie travels toward your forearm and your thumb moves away from your forearm.  Hold this position for __________ seconds and then slowly lower the wrist back to the starting position. Repeat __________ times. Complete this exercise __________ times per day STRENGTH - Radial Deviators  Stand with a ____________________ weight in your right / left hand, or sit while holding a rubber exercise band or tubing, with your injured arm supported on a table or countertop.  Raise your hand upward in front of you or pull up on  the rubber tubing.  Hold this position for __________ seconds and then slowly lower the wrist back to the starting position. Repeat __________ times. Complete this exercise __________ times per day. STRENGTH - Forearm Supinators   Sit with your right / left forearm supported on a table, keeping your elbow below shoulder height. Rest your hand over the edge, palm down.  Gently grip a hammer or a soup ladle.  Without moving your elbow, slowly turn your palm and hand upward to a "thumbs-up" position.  Hold this position for __________ seconds. Slowly return to the starting position. Repeat __________ times. Complete this exercise __________ times per day.  STRENGTH - Forearm Pronators   Sit with your right / left forearm supported on a table, keeping your elbow below shoulder height. Rest your hand over the edge, palm up.  Gently grip a hammer or a soup ladle.  Without moving your elbow, slowly turn your palm and hand upward to a "thumbs-up" position.  Hold this position for __________ seconds. Slowly return to the starting position. Repeat __________ times. Complete this exercise __________ times per day.  STRENGTH - Grip  Grasp a tennis ball, a dense sponge, or a large, rolled sock in your hand.  Squeeze as hard as you can, without increasing any pain.  Hold this position for __________ seconds. Release your grip slowly. Repeat __________ times. Complete this exercise __________  times per day.  STRENGTH - Elbow Extensors, Isometric  Stand or sit upright, on a firm surface. Place your right / left arm so that your palm faces your stomach, and it is at the height of your waist.  Place your opposite hand on the underside of your forearm. Gently push up as your right / left arm resists. Push as hard as you can with both arms, without causing any pain or movement at your right / left elbow. Hold this stationary position for __________ seconds. Gradually release the tension in both  arms. Allow your muscles to relax completely before repeating.   This information is not intended to replace advice given to you by your health care provider. Make sure you discuss any questions you have with your health care provider.   Document Released: 08/10/2005 Document Revised: 08/31/2014 Document Reviewed: 11/22/2008 Elsevier Interactive Patient Education [employer].

## 2016-04-16 ENCOUNTER — Ambulatory Visit: Payer: No Typology Code available for payment source | Admitting: Family Medicine

## 2016-04-16 NOTE — Assessment & Plan Note (Signed)
Still seems like lateral epicondylitis, however given the duration and pain both superior and inferior to the elbow, would like to rule out other pathology. - re-start naproxen - ordered x-ray of the R elbow - referral back to [facility]- she may get transient relief from interarticular steroid injection. - discussed return precautions

## 2016-04-17 ENCOUNTER — Telehealth: Payer: Self-pay | Admitting: Family Medicine

## 2016-04-17 MED ORDER — MELOXICAM 15 MG PO TABS: 15.0000 mg | ORAL_TABLET | Freq: Every day | ORAL | 0 refills | Status: DC

## 2016-04-17 NOTE — Telephone Encounter (Signed)
After clarifying imaging results with radiologist, I called the patient to relay information. It looks like she has bone spurs which may be causing her pain. No acute abnormalities found. Discussed she may benefit from steroid injection as we previously discussed. Referral to [facility] already made.  Pt notes Naproxen and BC powders are not helping. Rx for Mobic daily sent in. Advised her to avoid Aleve, Advil, Ibuprofen, BC powders, and Naproxen. Noted she could take Tylenol for break through pain.  Pt voiced appreciation for call.  Archie Patten, MD [facility] Resident  04/17/2016, 9:52 AM

## 2016-05-02 ENCOUNTER — Other Ambulatory Visit: Payer: Self-pay | Admitting: Orthopedic Surgery

## 2016-05-02 DIAGNOSIS — M25521 Pain in right elbow: Secondary | ICD-10-CM

## 2016-05-16 ENCOUNTER — Ambulatory Visit
Admission: RE | Admit: 2016-05-16 | Discharge: 2016-05-16 | Disposition: A | Discharge status: Home / Self Care | Payer: Managed Care, Other (non HMO) | Source: Ambulatory Visit | Attending: Orthopedic Surgery | Admitting: Orthopedic Surgery

## 2016-05-16 DIAGNOSIS — M25521 Pain in right elbow: Secondary | ICD-10-CM

## 2016-05-22 ENCOUNTER — Encounter (HOSPITAL_COMMUNITY): Payer: Self-pay

## 2016-05-22 ENCOUNTER — Emergency Department (HOSPITAL_COMMUNITY): Payer: Managed Care, Other (non HMO)

## 2016-05-22 ENCOUNTER — Emergency Department (HOSPITAL_COMMUNITY)
Admission: EM | Admit: 2016-05-22 | Discharge: 2016-05-22 | Disposition: A | Discharge status: Home / Self Care | Payer: Managed Care, Other (non HMO) | Attending: Physician Assistant | Admitting: Physician Assistant

## 2016-05-22 DIAGNOSIS — E1142 Type 2 diabetes mellitus with diabetic polyneuropathy: Secondary | ICD-10-CM | POA: Insufficient documentation

## 2016-05-22 DIAGNOSIS — R030 Elevated blood-pressure reading, without diagnosis of hypertension: Secondary | ICD-10-CM

## 2016-05-22 DIAGNOSIS — Z79899 Other long term (current) drug therapy: Secondary | ICD-10-CM | POA: Insufficient documentation

## 2016-05-22 DIAGNOSIS — I10 Essential (primary) hypertension: Secondary | ICD-10-CM | POA: Diagnosis not present

## 2016-05-22 DIAGNOSIS — IMO0001 Reserved for inherently not codable concepts without codable children: Secondary | ICD-10-CM

## 2016-05-22 LAB — CBC WITH DIFFERENTIAL/PLATELET
BASOS ABS: 0 10*3/uL (ref 0.0–0.1)
BASOS PCT: 0 %
EOS ABS: 0.1 10*3/uL (ref 0.0–0.7)
Eosinophils Relative: 1 %
HCT: 38.8 % (ref 36.0–46.0)
HEMOGLOBIN: 12.7 g/dL (ref 12.0–15.0)
Lymphocytes Relative: 39 %
Lymphs Abs: 4.1 10*3/uL — ABNORMAL HIGH (ref 0.7–4.0)
MCH: 29.7 pg (ref 26.0–34.0)
MCHC: 32.7 g/dL (ref 30.0–36.0)
MCV: 90.9 fL (ref 78.0–100.0)
MONOS PCT: 6 %
Monocytes Absolute: 0.6 10*3/uL (ref 0.1–1.0)
NEUTROS PCT: 54 %
Neutro Abs: 5.7 10*3/uL (ref 1.7–7.7)
Platelets: 417 10*3/uL — ABNORMAL HIGH (ref 150–400)
RBC: 4.27 MIL/uL (ref 3.87–5.11)
RDW: 13 % (ref 11.5–15.5)
WBC: 10.5 10*3/uL (ref 4.0–10.5)

## 2016-05-22 LAB — COMPREHENSIVE METABOLIC PANEL
ALBUMIN: 4.5 g/dL (ref 3.5–5.0)
ALK PHOS: 99 U/L (ref 38–126)
ALT: 27 U/L (ref 14–54)
ANION GAP: 6 (ref 5–15)
AST: 26 U/L (ref 15–41)
BUN: 13 mg/dL (ref 6–20)
CO2: 26 mmol/L (ref 22–32)
Calcium: 9.4 mg/dL (ref 8.9–10.3)
Chloride: 106 mmol/L (ref 101–111)
Creatinine, Ser: 0.82 mg/dL (ref 0.44–1.00)
GFR calc Af Amer: >60 mL/min (ref >60)
GFR calc non Af Amer: >60 mL/min (ref >60)
GLUCOSE: 87 mg/dL (ref 65–99)
POTASSIUM: 3.4 mmol/L — AB (ref 3.5–5.1)
SODIUM: 138 mmol/L (ref 135–145)
Total Bilirubin: 0.7 mg/dL (ref 0.3–1.2)
Total Protein: 8.2 g/dL — ABNORMAL HIGH (ref 6.5–8.1)

## 2016-05-22 LAB — I-STAT TROPONIN, ED: Troponin i, poc: 0.01 ng/mL (ref 0.00–0.08)

## 2016-05-22 MED ORDER — ASPIRIN EC 325 MG PO TBEC
325.0000 mg | DELAYED_RELEASE_TABLET | Freq: Once | ORAL | Status: AC
  Administered 2016-05-22: 325 mg via ORAL
  Filled 2016-05-22: qty 1

## 2016-05-22 MED ORDER — IBUPROFEN 800 MG PO TABS
800.0000 mg | ORAL_TABLET | Freq: Once | ORAL | Status: AC
  Administered 2016-05-22: 800 mg via ORAL
  Filled 2016-05-22: qty 1

## 2016-05-22 NOTE — Discharge Instructions (Signed)
Your EKG, labs were reassuring today. We will need to follow up with her primary care physician. Sometimes your blood pressure can get very high when you are agitated or excited. I think that is what happened to today. Please return with any chest pain or other concerns.
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
## 2016-06-15 ENCOUNTER — Telehealth: Payer: Self-pay | Admitting: Orthopedic Surgery

## 2016-06-15 NOTE — Telephone Encounter (Signed)
Pt. Didn't show for surgery today.  I called the pt. And left a voice mail message to call me back to reschedule her surgery.

## 2016-06-22 ENCOUNTER — Inpatient Hospital Stay (INDEPENDENT_AMBULATORY_CARE_PROVIDER_SITE_OTHER): Payer: Self-pay | Admitting: Orthopedic Surgery

## 2016-06-27 ENCOUNTER — Other Ambulatory Visit: Payer: Self-pay | Admitting: Family Medicine

## 2016-09-18 ENCOUNTER — Emergency Department (HOSPITAL_COMMUNITY)
Admission: EM | Admit: 2016-09-18 | Discharge: 2016-09-18 | Disposition: A | Discharge status: Home / Self Care | Payer: Managed Care, Other (non HMO) | Attending: Emergency Medicine | Admitting: Emergency Medicine

## 2016-09-18 ENCOUNTER — Emergency Department (HOSPITAL_COMMUNITY): Payer: Managed Care, Other (non HMO)

## 2016-09-18 ENCOUNTER — Encounter (HOSPITAL_COMMUNITY): Payer: Self-pay

## 2016-09-18 DIAGNOSIS — Z7982 Long term (current) use of aspirin: Secondary | ICD-10-CM | POA: Insufficient documentation

## 2016-09-18 DIAGNOSIS — Z79899 Other long term (current) drug therapy: Secondary | ICD-10-CM | POA: Insufficient documentation

## 2016-09-18 DIAGNOSIS — I10 Essential (primary) hypertension: Secondary | ICD-10-CM | POA: Insufficient documentation

## 2016-09-18 DIAGNOSIS — E1142 Type 2 diabetes mellitus with diabetic polyneuropathy: Secondary | ICD-10-CM | POA: Insufficient documentation

## 2016-09-18 DIAGNOSIS — K5732 Diverticulitis of large intestine without perforation or abscess without bleeding: Secondary | ICD-10-CM

## 2016-09-18 LAB — COMPREHENSIVE METABOLIC PANEL
ALT: 17 U/L (ref 14–54)
ANION GAP: 7 (ref 5–15)
AST: 16 U/L (ref 15–41)
Albumin: 4.2 g/dL (ref 3.5–5.0)
Alkaline Phosphatase: 99 U/L (ref 38–126)
BUN: 10 mg/dL (ref 6–20)
CHLORIDE: 102 mmol/L (ref 101–111)
CO2: 29 mmol/L (ref 22–32)
CREATININE: 0.55 mg/dL (ref 0.44–1.00)
Calcium: 9.1 mg/dL (ref 8.9–10.3)
Glucose, Bld: 103 mg/dL — ABNORMAL HIGH (ref 65–99)
POTASSIUM: 3.7 mmol/L (ref 3.5–5.1)
SODIUM: 138 mmol/L (ref 135–145)
Total Bilirubin: 0.8 mg/dL (ref 0.3–1.2)
Total Protein: 8.2 g/dL — ABNORMAL HIGH (ref 6.5–8.1)

## 2016-09-18 LAB — URINALYSIS, ROUTINE W REFLEX MICROSCOPIC
BILIRUBIN URINE: NEGATIVE
Glucose, UA: NEGATIVE mg/dL
KETONES UR: NEGATIVE mg/dL
LEUKOCYTES UA: NEGATIVE
Nitrite: NEGATIVE
PH: 6 (ref 5.0–8.0)
PROTEIN: NEGATIVE mg/dL
Specific Gravity, Urine: 1.015 (ref 1.005–1.030)

## 2016-09-18 LAB — CBC
HEMATOCRIT: 36.7 % (ref 36.0–46.0)
HEMOGLOBIN: 12.2 g/dL (ref 12.0–15.0)
MCH: 29.3 pg (ref 26.0–34.0)
MCHC: 33.2 g/dL (ref 30.0–36.0)
MCV: 88.2 fL (ref 78.0–100.0)
PLATELETS: 370 10*3/uL (ref 150–400)
RBC: 4.16 MIL/uL (ref 3.87–5.11)
RDW: 12.8 % (ref 11.5–15.5)
WBC: 12.7 10*3/uL — AB (ref 4.0–10.5)

## 2016-09-18 LAB — LIPASE, BLOOD: LIPASE: 24 U/L (ref 11–51)

## 2016-09-18 MED ORDER — CIPROFLOXACIN HCL 500 MG PO TABS: 500.0000 mg | ORAL_TABLET | Freq: Two times a day (BID) | ORAL | 0 refills | Status: DC

## 2016-09-18 MED ORDER — OXYCODONE-ACETAMINOPHEN 5-325 MG PO TABS: 1.0000 | ORAL_TABLET | ORAL | 0 refills | Status: DC | PRN

## 2016-09-18 MED ORDER — METRONIDAZOLE 500 MG PO TABS: 500.0000 mg | ORAL_TABLET | Freq: Two times a day (BID) | ORAL | 0 refills | Status: DC

## 2016-09-18 MED ORDER — ONDANSETRON HCL 4 MG/2ML IJ SOLN
4.0000 mg | Freq: Once | INTRAMUSCULAR | Status: AC
  Administered 2016-09-18: 4 mg via INTRAVENOUS
  Filled 2016-09-18: qty 2

## 2016-09-18 MED ORDER — ONDANSETRON 4 MG PO TBDP: 4.0000 mg | ORAL_TABLET | Freq: Three times a day (TID) | ORAL | 0 refills | Status: DC | PRN

## 2016-09-18 MED ORDER — KETOROLAC TROMETHAMINE 30 MG/ML IJ SOLN
30.0000 mg | Freq: Once | INTRAMUSCULAR | Status: AC
  Administered 2016-09-18: 30 mg via INTRAVENOUS
  Filled 2016-09-18: qty 1

## 2016-09-18 NOTE — ED Notes (Signed)
Patient with left flank/ad pain x3 days. Patient reports having had a kidney stone approx 17 years ago. Patient reports significant nausea as well, without emesis. Patient is A&Ox4, NAD noted. Patient reports frequent urination, with usual output amounts, and that it is darker than usual. Denies dysuria.

## 2016-09-18 NOTE — ED Notes (Signed)
ED Provider at bedside. 

## 2016-09-18 NOTE — ED Notes (Signed)
Patient returned from CT

## 2016-09-18 NOTE — ED Triage Notes (Signed)
Pt with left side flank/abdominal pain starting x 3 days ago  Pt states worse with ambulation.  Pt hx of kidney stones. Denies burning with urination.  Pt has some nausea with no vomiting.  Fever 102 last night.

## 2016-09-18 NOTE — Discharge Instructions (Signed)
Take the prescribed medication as directed. Follow-up with GI-- call to make appt.  Colonoscopy is recommended at age 51. May follow-up with your primary care doctor in the interim. Return to the ED for new or worsening symptoms.
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
## 2016-09-29 ENCOUNTER — Encounter: Payer: Self-pay | Admitting: Gastroenterology

## 2016-11-02 ENCOUNTER — Ambulatory Visit: Payer: Managed Care, Other (non HMO) | Admitting: Gastroenterology

## 2016-11-09 ENCOUNTER — Encounter (HOSPITAL_COMMUNITY): Payer: Self-pay | Admitting: Emergency Medicine

## 2016-11-09 ENCOUNTER — Emergency Department (HOSPITAL_COMMUNITY): Payer: Worker's Compensation

## 2016-11-09 ENCOUNTER — Emergency Department (HOSPITAL_COMMUNITY)
Admission: EM | Admit: 2016-11-09 | Discharge: 2016-11-09 | Disposition: A | Discharge status: Home / Self Care | Payer: Worker's Compensation | Attending: Emergency Medicine | Admitting: Emergency Medicine

## 2016-11-09 DIAGNOSIS — E1143 Type 2 diabetes mellitus with diabetic autonomic (poly)neuropathy: Secondary | ICD-10-CM | POA: Insufficient documentation

## 2016-11-09 DIAGNOSIS — M79675 Pain in left toe(s): Secondary | ICD-10-CM

## 2016-11-09 DIAGNOSIS — I10 Essential (primary) hypertension: Secondary | ICD-10-CM | POA: Insufficient documentation

## 2016-11-09 DIAGNOSIS — W1830XA Fall on same level, unspecified, initial encounter: Secondary | ICD-10-CM | POA: Insufficient documentation

## 2016-11-09 DIAGNOSIS — W19XXXA Unspecified fall, initial encounter: Secondary | ICD-10-CM

## 2016-11-09 DIAGNOSIS — Y939 Activity, unspecified: Secondary | ICD-10-CM | POA: Insufficient documentation

## 2016-11-09 DIAGNOSIS — Z7982 Long term (current) use of aspirin: Secondary | ICD-10-CM | POA: Insufficient documentation

## 2016-11-09 DIAGNOSIS — Y929 Unspecified place or not applicable: Secondary | ICD-10-CM | POA: Insufficient documentation

## 2016-11-09 DIAGNOSIS — Y999 Unspecified external cause status: Secondary | ICD-10-CM | POA: Insufficient documentation

## 2016-11-09 DIAGNOSIS — S50312A Abrasion of left elbow, initial encounter: Secondary | ICD-10-CM | POA: Insufficient documentation

## 2016-11-09 DIAGNOSIS — M25562 Pain in left knee: Secondary | ICD-10-CM

## 2016-11-09 DIAGNOSIS — Z79899 Other long term (current) drug therapy: Secondary | ICD-10-CM | POA: Insufficient documentation

## 2016-11-09 DIAGNOSIS — S90112A Contusion of left great toe without damage to nail, initial encounter: Secondary | ICD-10-CM | POA: Insufficient documentation

## 2016-11-09 MED ORDER — ACETAMINOPHEN 325 MG PO TABS
650.0000 mg | ORAL_TABLET | Freq: Once | ORAL | Status: AC
  Administered 2016-11-09: 650 mg via ORAL
  Filled 2016-11-09: qty 2

## 2016-11-09 MED ORDER — IBUPROFEN 200 MG PO TABS
400.0000 mg | ORAL_TABLET | Freq: Once | ORAL | Status: AC | PRN
  Administered 2016-11-09: 400 mg via ORAL
  Filled 2016-11-09: qty 2

## 2016-11-09 NOTE — Discharge Instructions (Signed)
Please read attached information. If you experience any new or worsening signs or symptoms please return to the emergency room for evaluation. Please follow-up with your primary care provider or specialist as discussed.  °
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
## 2016-11-09 NOTE — ED Notes (Signed)
Bed: WTR6 Expected date:  Expected time:  Means of arrival:  Comments: 

## 2016-11-09 NOTE — ED Triage Notes (Signed)
Per EMS. Pt fell this morning after tripping over dolly. Pt fell on bilateral elbows and L knee. Pt complained of L elbow pain and L knee/shin pain. Pt had neck pain, so placed in C-collar by EMS. Did not hit head; No LOC.

## 2016-11-27 ENCOUNTER — Encounter (HOSPITAL_COMMUNITY): Payer: Self-pay | Admitting: *Deleted

## 2017-02-27 ENCOUNTER — Telehealth: Payer: Self-pay | Admitting: Internal Medicine

## 2017-02-27 NOTE — Telephone Encounter (Signed)
[facility] After Hours Telephone Line    Patient calling reporting back pain. Has history of sciatica for which she has been seen multiple times. Reports back pain with radiation down her leg. Pain is not new but is worse today. Reports now having pain with walking or laying down. Denies numbness, weakness, bowel or bladder incontinence. Wants to know if she should come to ED for imaging. Also reporting swelling of her L ankle. Denies redness or warmth. Denies fevers. She has a history of "some kind of cysts" in her foot and ankle that have been removed in the past and says this is similar to that episode. Is able to walk though slightly painful, but thinks this is more due to her back pain than ankle pain. She is wondering if she needs xrays of her foot and ankle. Discussed that as she has already had imaging done and has a known diagnosis of sciatica, more imaging is not indicated, and without red flags, she is stable enough to wait until Monday morning to schedule a same day appointment. Also discussed that as she does not have any red flags in regards to her ankle swelling, that can also wait to be evaluated Monday morning. Discussed that if imaging is indicated, she can have that done outpatient. Reasons for presenting to ED were discussed. Patient voiced understanding and appreciation.   Adin Hector, MD, MPH PGY-3 [facility] Pager [phone]

## 2017-03-01 ENCOUNTER — Encounter: Payer: Self-pay | Admitting: Family Medicine

## 2017-03-01 ENCOUNTER — Ambulatory Visit (INDEPENDENT_AMBULATORY_CARE_PROVIDER_SITE_OTHER): Payer: Self-pay | Admitting: Family Medicine

## 2017-03-01 VITALS — BP 142/94 | HR 90 | Wt 239.0 lb

## 2017-03-01 DIAGNOSIS — E1142 Type 2 diabetes mellitus with diabetic polyneuropathy: Secondary | ICD-10-CM

## 2017-03-01 DIAGNOSIS — M5441 Lumbago with sciatica, right side: Secondary | ICD-10-CM

## 2017-03-01 DIAGNOSIS — M7989 Other specified soft tissue disorders: Secondary | ICD-10-CM

## 2017-03-01 DIAGNOSIS — M79675 Pain in left toe(s): Secondary | ICD-10-CM

## 2017-03-01 DIAGNOSIS — G8929 Other chronic pain: Secondary | ICD-10-CM

## 2017-03-01 DIAGNOSIS — M7741 Metatarsalgia, right foot: Secondary | ICD-10-CM

## 2017-03-01 MED ORDER — LIDO-CAPSAICIN-MEN-METHYL SAL 4-0.0375-5-20 % EX PTCH: 1.0000 | MEDICATED_PATCH | Freq: Every day | CUTANEOUS | 0 refills | Status: DC

## 2017-03-01 MED ORDER — GABAPENTIN 100 MG PO CAPS: 100.0000 mg | ORAL_CAPSULE | Freq: Three times a day (TID) | ORAL | 1 refills | Status: DC

## 2017-03-01 NOTE — Patient Instructions (Signed)
It was a pleasure to see you today! Thank you for choosing [facility] for your primary care. Isabel Berger was seen for back pain, bilateral foot pain.   Our plans for today were:  For your back - get the lidocaine patches  For your left foot - I think the swelling is from your past surgery. Try using a compression stocking on this leg. The gabapentin can help with this pain as well.   For your right foot - I am referring you to sports medicine. They will see whether they think you need a brace in your shoe vs injection.   You should return to our clinic to see Dr. Reesa Chew in 2 weeks for blood pressure.   Best,  Dr. Lindell Noe

## 2017-03-01 NOTE — Progress Notes (Signed)
   CC: pain in back, both feet  HPI Back pain - R lower back. Knows she has arthritis. Thinks this is worsened. Couldn't walk this weekend. Has tried tylenol and ibuprofen for the pain. Reports this doesn't help at all and makes her nauseous. In the past she got some pain medicine that helped for 3 days. Tried a hot patch and felt like this was burning her.   Bilateral foot pain - no diagnoses that she knows of. Hx of surgery on L foot. Can't fit her left foot in her regular shoes. Swelling x 3 months. Just left foot. Thinks there is a cyst in her R foot.   These are causing her to miss work which is making it hard to pay her bills. Had to sit down while cooking because her pain was so bad.   CC, SH/smoking status, and VS noted  Objective: BP (!) 142/94   Pulse 90   Wt 239 lb (108.4 kg)   BMI 37.43 kg/m  Gen: NAD, alert, cooperative, and pleasant obese female.  HEENT: NCAT, EOMI, PERRL CV: RRR, no murmur Resp: CTAB, no wheezes, non-labored Abd: SNTND, BS present, no guarding or organomegaly. TTP over right paraspinal lumbar and sacral region. ROM normal.  Ext: WWP. Negative homan's sign bilaterally. 1+ pitting edema of L foot, diffusely painful to light touch. TTP over R base of metatarsal, no swelling or wound.  Neuro: Alert and oriented, Speech clear, No gross deficits  Assessment and plan:  Right-sided low back pain with sciatica No red flag symptoms. Suspect some component of hypersensitivity/chronic pain. Weight loss would likely alleviate some of this pain. Prescribed lidocaine patch.   Diabetic polyneuropathy associated with type 2 diabetes mellitus ([place]) Suspect this contributes to her bilateral LE pain. Refilled gabapentin.   Metatarsalgia of right foot R base of metatarsal pain x >1 month. Referral to sports medicine for orthotics vs injection placed.   Pain and swelling of toe of left foot Likely due to history of surgery to this foot with possible lymphatic  disruption. Recommended compression stockings. Negative homan's sign, good pulse. DVT unlikely.    Orders Placed This Encounter  Procedures  . Ambulatory referral to Sports Medicine    Referral Priority:   Routine    Referral Type:   Consultation    Number of Visits Requested:   1    Meds ordered this encounter  Medications  . gabapentin (NEURONTIN) 100 MG capsule    Sig: Take 1 capsule (100 mg total) by mouth 3 (three) times daily.    Dispense:  60 capsule    Refill:  1  . Lido-Capsaicin-Men-Methyl Sal (1ST MEDX-PATCH/ LIDOCAINE) 4-0.374-01-10 % PTCH    Sig: Apply 1 patch topically daily.    Dispense:  30 patch    Refill:  0    Health Maintenance: unable to cover due to same day visit and multiple complaints. Asked patient to be seen ASAP with PCP for chronic medical issues.   Ralene Ok, MD, PGY2 03/02/2017 10:17 AM

## 2017-03-02 ENCOUNTER — Ambulatory Visit: Payer: Self-pay | Admitting: Internal Medicine

## 2017-03-02 DIAGNOSIS — M7989 Other specified soft tissue disorders: Secondary | ICD-10-CM

## 2017-03-02 DIAGNOSIS — M792 Neuralgia and neuritis, unspecified: Secondary | ICD-10-CM | POA: Insufficient documentation

## 2017-03-02 DIAGNOSIS — M79675 Pain in left toe(s): Secondary | ICD-10-CM | POA: Insufficient documentation

## 2017-03-02 DIAGNOSIS — M7741 Metatarsalgia, right foot: Secondary | ICD-10-CM | POA: Insufficient documentation

## 2017-03-02 NOTE — Assessment & Plan Note (Signed)
R base of metatarsal pain x >1 month. Referral to sports medicine for orthotics vs injection placed.

## 2017-03-02 NOTE — Assessment & Plan Note (Signed)
No red flag symptoms. Suspect some component of hypersensitivity/chronic pain. Weight loss would likely alleviate some of this pain. Prescribed lidocaine patch.

## 2017-03-02 NOTE — Assessment & Plan Note (Signed)
Likely due to history of surgery to this foot with possible lymphatic disruption. Recommended compression stockings. Negative homan's sign, good pulse. DVT unlikely.

## 2017-03-02 NOTE — Assessment & Plan Note (Signed)
Suspect this contributes to her bilateral LE pain. Refilled gabapentin.

## 2017-03-05 ENCOUNTER — Ambulatory Visit (INDEPENDENT_AMBULATORY_CARE_PROVIDER_SITE_OTHER): Payer: Self-pay | Admitting: Internal Medicine

## 2017-03-05 ENCOUNTER — Ambulatory Visit (HOSPITAL_COMMUNITY)
Admission: RE | Admit: 2017-03-05 | Discharge: 2017-03-05 | Disposition: A | Discharge status: Home / Self Care | Payer: Self-pay | Source: Ambulatory Visit | Attending: Family Medicine | Admitting: Family Medicine

## 2017-03-05 DIAGNOSIS — R9431 Abnormal electrocardiogram [ECG] [EKG]: Secondary | ICD-10-CM | POA: Insufficient documentation

## 2017-03-05 DIAGNOSIS — E119 Type 2 diabetes mellitus without complications: Secondary | ICD-10-CM

## 2017-03-05 DIAGNOSIS — R55 Syncope and collapse: Secondary | ICD-10-CM

## 2017-03-05 LAB — GLUCOSE, POCT (MANUAL RESULT ENTRY): POC GLUCOSE: 155 mg/dL — AB (ref 70–99)

## 2017-03-05 LAB — POCT GLYCOSYLATED HEMOGLOBIN (HGB A1C): HEMOGLOBIN A1C: 6.4

## 2017-03-05 NOTE — Progress Notes (Signed)
   [facility] Kerrin Mo, MD Phone: [phone]  Reason For Visit: SDA for Presyncope   # Patient was walking into work when suddenly started feeling dizzy and everything started going dark. Denies any falling - patient sat down and put her head down on the desk. Turned the light off and put her head down for about 1 hour. Felt better but had a sick feeling on the stomach Denies any chest pain, SOB, no palpations. Checked her blood sugar - CBG was 199. Has not really been taking any medications.   CHRONIC DM, Type 2: Has been diet controlled, patient concerned symptoms above due to diabetes. CBG checked by friend and was 199. Denies polyuria, visual changes, numbness or tingling.  Past Medical History Reviewed problem list.  Medications- reviewed and updated No additions to family history Social history- patient is a non-smoker  Objective: There were no vitals taken for this visit. - Vitals were wnl, thought not recorded here  Gen: NAD, alert, cooperative with exam Cardio: regular rate and rhythm, S1S2 heard, no murmurs appreciated Pulm: clear to auscultation bilaterally, no wheezes, rhonchi or rales GI: soft, non-tender, non-distended, bowel sounds present, no hepatomegaly, no splenomegaly Extremities: warm, well perfused, No edema, cyanosis or clubbing;  Neurologic exam : Cn 2-7 intact Strength equal & normal in upper & lower extremities Able to walk on heels and toes.   Balance normal   finger to nose     Assessment/Plan: See problem based a/p  Pre-syncope Presyncope x 1 episode. EKG wnl. Normal neurologic exam  - EKG 12-Lead - CMP14+EGFR; Future - CBC; Future - Follow up with provider in 2 weeks  - if patient has repeat episodes of presyncope then consider cardiology consult    Diabetes mellitus without complication (HCC) - Glucose (CBG) - HgB A1c

## 2017-03-05 NOTE — Progress Notes (Deleted)
   [facility] Kerrin Mo, MD Phone: [phone]  Reason For Visit:   # *** -   Past Medical History Reviewed problem list.  Medications- reviewed and updated No additions to family history Social history- patient is a *** smoker  Objective: There were no vitals taken for this visit. Gen: NAD, alert, cooperative with exam HEENT: Normal    Neck: No masses palpated. No lymphadenopathy    Ears: Tympanic membranes intact, normal light reflex, no erythema, no bulging    Eyes: PERRLA, EOMI    Nose: nasal turbinates moist    Throat: moist mucus membranes, no erythema Cardio: regular rate and rhythm, S1S2 heard, no murmurs appreciated Pulm: clear to auscultation bilaterally, no wheezes, rhonchi or rales GI: soft, non-tender, non-distended, bowel sounds present, no hepatomegaly, no splenomegaly GU: external vaginal tissue ***, cervix ***, *** punctate lesions on cervix appreciated, *** discharge from cervical os, *** bleeding, *** cervical motion tenderness, *** abdominal/ adnexal masses Extremities: warm, well perfused, No edema, cyanosis or clubbing;  MSK: Normal gait and station Skin: dry, intact, no rashes or lesions Neuro: Strength and sensation grossly intact   Assessment/Plan: See problem based a/p  No problem-specific Assessment & Plan notes found for this encounter.

## 2017-03-05 NOTE — Patient Instructions (Signed)
Please follow-up for your labs on Monday. Your heart looks okay on EKG. If you continue to have symptoms please return for reevaluation.

## 2017-03-08 ENCOUNTER — Other Ambulatory Visit: Payer: Self-pay

## 2017-03-08 DIAGNOSIS — R55 Syncope and collapse: Secondary | ICD-10-CM

## 2017-03-08 NOTE — Assessment & Plan Note (Signed)
-   Glucose (CBG) - HgB A1c

## 2017-03-08 NOTE — Assessment & Plan Note (Signed)
Presyncope x 1 episode. EKG wnl. Normal neurologic exam  - EKG 12-Lead - CMP14+EGFR; Future - CBC; Future - Follow up with provider in 2 weeks  - if patient has repeat episodes of presyncope then consider cardiology consult

## 2017-03-09 ENCOUNTER — Encounter: Payer: Self-pay | Admitting: Internal Medicine

## 2017-03-09 LAB — CMP14+EGFR
ALBUMIN: 4.1 g/dL (ref 3.5–5.5)
ALT: 12 IU/L (ref 0–32)
AST: 16 IU/L (ref 0–40)
Albumin/Globulin Ratio: 1.4 (ref 1.2–2.2)
Alkaline Phosphatase: 102 IU/L (ref 39–117)
BUN/Creatinine Ratio: 14 (ref 9–23)
BUN: 11 mg/dL (ref 6–24)
Bilirubin Total: 0.3 mg/dL (ref 0.0–1.2)
CALCIUM: 9.6 mg/dL (ref 8.7–10.2)
CO2: 24 mmol/L (ref 20–29)
CREATININE: 0.76 mg/dL (ref 0.57–1.00)
Chloride: 103 mmol/L (ref 96–106)
GFR, EST AFRICAN AMERICAN: 107 mL/min/{1.73_m2} (ref >59)
GFR, EST NON AFRICAN AMERICAN: 92 mL/min/{1.73_m2} (ref >59)
GLOBULIN, TOTAL: 2.9 g/dL (ref 1.5–4.5)
GLUCOSE: 148 mg/dL — AB (ref 65–99)
Potassium: 4.6 mmol/L (ref 3.5–5.2)
Sodium: 142 mmol/L (ref 134–144)
TOTAL PROTEIN: 7 g/dL (ref 6.0–8.5)

## 2017-03-09 LAB — CBC
HEMATOCRIT: 38.9 % (ref 34.0–46.6)
Hemoglobin: 12.7 g/dL (ref 11.1–15.9)
MCH: 29.8 pg (ref 26.6–33.0)
MCHC: 32.6 g/dL (ref 31.5–35.7)
MCV: 91 fL (ref 79–97)
PLATELETS: 334 10*3/uL (ref 150–379)
RBC: 4.26 x10E6/uL (ref 3.77–5.28)
RDW: 13.7 % (ref 12.3–15.4)
WBC: 8.1 10*3/uL (ref 3.4–10.8)

## 2017-03-18 ENCOUNTER — Ambulatory Visit (INDEPENDENT_AMBULATORY_CARE_PROVIDER_SITE_OTHER): Payer: Self-pay | Admitting: Sports Medicine

## 2017-03-18 ENCOUNTER — Ambulatory Visit
Admission: RE | Admit: 2017-03-18 | Discharge: 2017-03-18 | Disposition: A | Discharge status: Home / Self Care | Payer: No Typology Code available for payment source | Source: Ambulatory Visit | Attending: Sports Medicine | Admitting: Sports Medicine

## 2017-03-18 VITALS — BP 138/89 | Ht 67.0 in | Wt 241.0 lb

## 2017-03-18 DIAGNOSIS — M79672 Pain in left foot: Secondary | ICD-10-CM

## 2017-03-18 DIAGNOSIS — M79671 Pain in right foot: Secondary | ICD-10-CM

## 2017-03-18 MED ORDER — DICLOFENAC SODIUM 75 MG PO TBEC: DELAYED_RELEASE_TABLET | ORAL | 0 refills | Status: DC

## 2017-03-19 NOTE — Progress Notes (Signed)
   Subjective:    Patient ID: Isabel Berger, female    DOB: 1965-11-02, 51 y.o.   MRN: [id number]  HPI chief complaint: Bilateral foot pain  51 year old female comes in today complaining of bilateral foot pain which has been present for several months. She has a remote injury to both ankles. She suffered some sort of fracture when jumping down from bleachers many years ago. Main complaint in the right foot is swelling and metatarsalgia. Her left foot and ankle are diffusely sore and she has persistent swelling which worsens throughout the day. She denies any recent trauma. She is unable to find comfortable shoes. No numbness or tingling. No recent imaging.  Past medical history reviewed Medications reviewed Allergies reviewed     Review of Systems    As above  Objective:   Physical Exam  Well-developed, well-nourished. No acute distress  Right foot: Mild diffuse soft tissue swelling. She is tender to palpation along the metatarsal heads. No erythema. No ankle effusion. She has a well-healed surgical incision along the lateral ankle. Good pulses.  Left foot: Diffuse soft tissue swelling. Diffuse tenderness to palpation. No erythema. No obvious effusion. Well-healed surgical incision along the lateral ankle. Good pulses. Patient ambulate with a limp.  X-rays of her left foot and left ankle are reviewed. She has diffuse arthritic changes and soft tissue swelling. Nothing acute. X-rays of her right foot and ankle are reviewed. She has diffuse soft tissue swelling and a large calcaneal spur.      Assessment & Plan:  Chronic bilateral foot and ankle pain secondary to degenerative changes  We will try the patient on some oral diclofenac with instructions to take 75 mg twice daily with food as needed. She is cautioned about GI upset. We've given her some green sports insoles and I added a metatarsal pad on the right. I also gave her some crutches to help assist with ambulation. Follow-up  with me in 3 weeks for reevaluation. We may need to consider referral if her symptoms do not improve. Of note, she will be leaving shortly for a trip to [place] so I will be sure to provide her with a note so that she can get a scooter for that trip. I will give her this note at her follow-up visit in 3 weeks.

## 2017-03-22 ENCOUNTER — Ambulatory Visit: Payer: Self-pay | Admitting: Family Medicine

## 2017-04-06 ENCOUNTER — Encounter: Payer: Self-pay | Admitting: Sports Medicine

## 2017-04-06 ENCOUNTER — Ambulatory Visit (INDEPENDENT_AMBULATORY_CARE_PROVIDER_SITE_OTHER): Payer: Self-pay | Admitting: Sports Medicine

## 2017-04-06 VITALS — BP 140/90 | Ht 67.0 in | Wt 241.0 lb

## 2017-04-06 DIAGNOSIS — M79672 Pain in left foot: Secondary | ICD-10-CM

## 2017-04-06 DIAGNOSIS — M79671 Pain in right foot: Secondary | ICD-10-CM

## 2017-04-06 NOTE — Progress Notes (Signed)
   Subjective:    Patient ID: Isabel Berger, female    DOB: 1966/02/23, 51 y.o.   MRN: [id number]  HPI   Patient comes in today for follow-up on bilateral foot and ankle pain. Still having pain despite a metatarsal pad and diclofenac. She describes it as a burning pain. She removed the metatarsal pad because it was causing increasing pain. X-rays of her ankles show arthritis especially in the left ankle. She is leaving for a trip to [place] later this week. She's not sure she will be able to walk as much as she needs to enjoy the park.        Review of Systems as above    Objective:   Physical Exam  Obese. No acute distress  Examination of both ankles and feet show diffuse soft tissue swelling bilaterally, left greater than right. Limited ankle range of motion secondary to pain. Diffuse tenderness to palpation. No erythema. Neurovascularly intact distally. Walking with a limp.      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 Patient is waiting on financial assistance from the hospital. Once she has that then she will contact me and we will make the appropriate referral. In the meantime, I did give her a letter to give to the staff at [facility] asking that they provide her with a motorized scooter to help her get around the [place]. Continue with diclofenac as needed. She can alternate with Tylenol arthritis.

## 2017-04-19 ENCOUNTER — Other Ambulatory Visit: Payer: Self-pay | Admitting: *Deleted

## 2017-04-19 DIAGNOSIS — M79671 Pain in right foot: Secondary | ICD-10-CM

## 2017-04-19 DIAGNOSIS — M79672 Pain in left foot: Principal | ICD-10-CM

## 2017-04-19 MED ORDER — DICLOFENAC SODIUM 75 MG PO TBEC: DELAYED_RELEASE_TABLET | ORAL | 1 refills | Status: DC

## 2017-06-04 ENCOUNTER — Ambulatory Visit: Payer: Self-pay | Admitting: Family Medicine

## 2017-08-30 ENCOUNTER — Emergency Department (HOSPITAL_COMMUNITY)
Admission: EM | Admit: 2017-08-30 | Discharge: 2017-08-30 | Disposition: A | Discharge status: Home / Self Care | Payer: Self-pay | Attending: Emergency Medicine | Admitting: Emergency Medicine

## 2017-08-30 ENCOUNTER — Emergency Department (HOSPITAL_COMMUNITY): Payer: Self-pay

## 2017-08-30 ENCOUNTER — Encounter (HOSPITAL_COMMUNITY): Payer: Self-pay | Admitting: Emergency Medicine

## 2017-08-30 ENCOUNTER — Other Ambulatory Visit: Payer: Self-pay

## 2017-08-30 DIAGNOSIS — Y9389 Activity, other specified: Secondary | ICD-10-CM | POA: Insufficient documentation

## 2017-08-30 DIAGNOSIS — I10 Essential (primary) hypertension: Secondary | ICD-10-CM | POA: Insufficient documentation

## 2017-08-30 DIAGNOSIS — W25XXXA Contact with sharp glass, initial encounter: Secondary | ICD-10-CM | POA: Insufficient documentation

## 2017-08-30 DIAGNOSIS — Z79899 Other long term (current) drug therapy: Secondary | ICD-10-CM | POA: Insufficient documentation

## 2017-08-30 DIAGNOSIS — S61511A Laceration without foreign body of right wrist, initial encounter: Secondary | ICD-10-CM | POA: Insufficient documentation

## 2017-08-30 DIAGNOSIS — W228XXA Striking against or struck by other objects, initial encounter: Secondary | ICD-10-CM | POA: Insufficient documentation

## 2017-08-30 DIAGNOSIS — E119 Type 2 diabetes mellitus without complications: Secondary | ICD-10-CM | POA: Insufficient documentation

## 2017-08-30 DIAGNOSIS — S41111D Laceration without foreign body of right upper arm, subsequent encounter: Secondary | ICD-10-CM

## 2017-08-30 DIAGNOSIS — Y929 Unspecified place or not applicable: Secondary | ICD-10-CM | POA: Insufficient documentation

## 2017-08-30 DIAGNOSIS — Y998 Other external cause status: Secondary | ICD-10-CM | POA: Insufficient documentation

## 2017-08-30 MED ORDER — BACITRACIN ZINC 500 UNIT/GM EX OINT
TOPICAL_OINTMENT | CUTANEOUS | Status: AC
  Administered 2017-08-30: 1
  Filled 2017-08-30: qty 0.9

## 2017-08-30 MED ORDER — LIDOCAINE HCL (PF) 2 % IJ SOLN
10.0000 mL | Freq: Once | INTRAMUSCULAR | Status: DC
  Filled 2017-08-30: qty 10

## 2017-08-30 NOTE — Discharge Instructions (Signed)
Please read attached information. If you experience any new or worsening signs or symptoms please return to the emergency room for evaluation. Please follow-up with your primary care provider or specialist as discussed.  °
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
## 2017-10-28 ENCOUNTER — Ambulatory Visit: Payer: Self-pay | Admitting: Family Medicine

## 2017-10-29 ENCOUNTER — Encounter: Payer: Self-pay | Admitting: Family Medicine

## 2017-10-29 ENCOUNTER — Ambulatory Visit (INDEPENDENT_AMBULATORY_CARE_PROVIDER_SITE_OTHER): Payer: Self-pay | Admitting: Family Medicine

## 2017-10-29 ENCOUNTER — Other Ambulatory Visit: Payer: Self-pay

## 2017-10-29 VITALS — BP 132/80 | HR 95 | Temp 97.8°F | Wt 222.8 lb

## 2017-10-29 DIAGNOSIS — E119 Type 2 diabetes mellitus without complications: Secondary | ICD-10-CM

## 2017-10-29 DIAGNOSIS — H538 Other visual disturbances: Secondary | ICD-10-CM

## 2017-10-29 LAB — POCT GLYCOSYLATED HEMOGLOBIN (HGB A1C): HEMOGLOBIN A1C: 12.4

## 2017-10-29 NOTE — Patient Instructions (Signed)
It was nice meeting you today!  You were seen in clinic for blurry vision.  We discussed that your headaches are most likely due to eye strain and I would like for you to see your ophthalmologist.  It is possible you may need a higher prescription for your glasses.  Additionally, we checked your A1c today and I will give you a call once I have the result.  Call clinic if you have any questions.  Be well, Lovenia Kim MD

## 2017-10-29 NOTE — Progress Notes (Signed)
   Subjective:   Patient ID: Isabel Berger    DOB: 1966-05-04, 52 y.o. female   MRN: [id number]  CC: "jumpy vision"  HPI: Isabel Berger is a 52 y.o. female who presents to clinic today for vision abnormality. Symptoms present x several months.  Patient states she has been wearing her prescription glasses from 2 years ago.  She has not had her eyes checked in a very long time and cannot remember her last eye doctor visit.  She reports some headaches almost daily and feels like it is due to straining her eyes.  She reports stress at work .  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.  She denies dizziness or falls.  Her vision is blurry with 4 objects.  Denies double vision.  She states she does not need a referral today as she has an ophthalmologist on [employer] and will make this appointment.  ROS: Denies fever, chills, nausea, vomiting.  Denies double vision.  Denies dizziness, lightheadedness, shortness of breath.  Social: Patient is a never smoker Medications reviewed. Objective:   BP 132/80   Pulse 95   Temp 97.8 F (36.6 C) (Oral)   Wt 222 lb 12.8 oz (101.1 kg)   SpO2 99%   BMI 34.90 kg/m  Vitals and nursing note reviewed.  General: Pleasant 52 year old female, NAD HEENT: NCAT, EOMI, PERRLA, MMM, oropharynx is clear Neck: supple, nontender, no LAD CV: RRR no MRG Lungs: CTA B, no wheeze, normal effort Skin: warm, dry, no rashes or lesions, cap refill < 2 seconds Extremities: warm and well perfused, normal tone Neuro: Alert, oriented x3, CN II-XII grossly intact, finger to nose within normal, strength 5/5 bilaterally in upper and lower extremities, normal sensation, normal gait   Assessment & Plan:   Diabetes mellitus without complication (HCC) Last N8M July 2018 6.4 and within control.  -Recheck A1c today -Due for annual eye exam  Vision blurred Patient complaining of blurry  vision over several months.  She has been wearing prescription glasses from 2 years ago and it is likely she needs a different eyeglass prescription. Nature of headache is not suspicious for temporal arteritis and may be related to eyestrain.  Blood pressure is well controlled at this visit: 118/68 and no red flags on exam.   Vision screen in clinic 20/100. -Diabetic, due for annual eye exam -Offered ophthalmology referral however patient states she can make an appointment at [place] where she has been seen in the past .  I advised patient if she does need a referral, to call and let me know and I will place this.  Orders Placed This Encounter  Procedures  . HgB A1c   Follow up: 2 months  Lovenia Kim, MD [place], PGY-2 11/10/2017 6:57 AM

## 2017-11-10 DIAGNOSIS — H538 Other visual disturbances: Secondary | ICD-10-CM | POA: Insufficient documentation

## 2017-11-10 NOTE — Assessment & Plan Note (Addendum)
Last A1c July 2018 6.4 and within control.  -Recheck A1c today -Due for annual eye exam

## 2017-11-10 NOTE — Assessment & Plan Note (Signed)
Patient complaining of blurry vision over several months.  She has been wearing prescription glasses from 2 years ago and it is likely she needs a different eyeglass prescription. Nature of headache is not suspicious for temporal arteritis and may be related to eyestrain.  Blood pressure is well controlled at this visit: 118/68 and no red flags on exam.   Vision screen in clinic 20/100. -Diabetic, due for annual eye exam -Offered ophthalmology referral however patient states she can make an appointment at [place] where she has been seen in the past .  I advised patient if she does need a referral, to call and let me know and I will place this.

## 2017-11-26 ENCOUNTER — Other Ambulatory Visit: Payer: Self-pay

## 2017-11-26 ENCOUNTER — Other Ambulatory Visit: Payer: Self-pay | Admitting: Family Medicine

## 2017-11-26 ENCOUNTER — Ambulatory Visit (INDEPENDENT_AMBULATORY_CARE_PROVIDER_SITE_OTHER): Payer: Self-pay | Admitting: Family Medicine

## 2017-11-26 VITALS — BP 124/70 | HR 84 | Temp 98.5°F | Ht 67.0 in | Wt 219.0 lb

## 2017-11-26 DIAGNOSIS — E1142 Type 2 diabetes mellitus with diabetic polyneuropathy: Secondary | ICD-10-CM

## 2017-11-26 DIAGNOSIS — E119 Type 2 diabetes mellitus without complications: Secondary | ICD-10-CM

## 2017-11-26 DIAGNOSIS — I10 Essential (primary) hypertension: Secondary | ICD-10-CM

## 2017-11-26 DIAGNOSIS — Z1231 Encounter for screening mammogram for malignant neoplasm of breast: Secondary | ICD-10-CM

## 2017-11-26 DIAGNOSIS — Z1239 Encounter for other screening for malignant neoplasm of breast: Secondary | ICD-10-CM

## 2017-11-26 LAB — POCT GLYCOSYLATED HEMOGLOBIN (HGB A1C): Hemoglobin A1C: 14.2

## 2017-11-26 MED ORDER — METFORMIN HCL 500 MG PO TABS: 1000.0000 mg | ORAL_TABLET | Freq: Two times a day (BID) | ORAL | 3 refills | Status: DC

## 2017-11-26 MED ORDER — GABAPENTIN 100 MG PO CAPS: 100.0000 mg | ORAL_CAPSULE | Freq: Three times a day (TID) | ORAL | 2 refills | Status: DC

## 2017-11-26 NOTE — Patient Instructions (Signed)
It was nice seeing you again today! You were seen in clinic for follow up of your diabetes.  We rechecked your A1c today.  I have sent in your Gabapentin to the pharmacy and will call you with the result of your labs.  I will then send Metformin into your pharmacy depending on what your A1c is.  I would like you to check your sugars three times daily and keep a log of your readings to discuss with me next time.  Please call clinic if you have any questions.  You can follow up in 1 month.    Be well, Lovenia Kim, MD

## 2017-11-26 NOTE — Progress Notes (Signed)
   Subjective:   Patient ID: Isabel Berger    DOB: Mar 05, 1966, 52 y.o. female   MRN: [id number]  CC: f/u diabetes   HPI: Isabel Berger is a 52 y.o. female who presents to clinic today for f/u diabetes.  T2DM A1c 12.4 at last visit, significantly up from 6.4 one year ago in July 2018  .  Used to take Metformin and Byetta.  She is aware she is supposed to be on medications but she has not been taking it for 1 year .  Multiple stressors are the underlying reason for her not taking her medications however she is now motivated to take care of herself and control her diabetes.  She reports night cramps in bilateral lower extremities.  She states she was on gabapentin previously but has not taken this recently. -Exercise- walks constantly everyday, uses the stairs when at work -Diet  - eats whenever she has time, avoids junk food, eats salads and baked meats rather then fried  -Eye exam: Ophthalmology appt scheduled end of April, 4/29 for diabetic eye exam   Health Maintenance  -Mammogram:  Given number to call and schedule B/L screening mammogram   ROS: Denies fever, chills, nausea, vomiting.  No chest pain, shortness of breath.  Social: Patient is a never smoker Medications reviewed. Objective:   BP 124/70   Pulse 84   Temp 98.5 F (36.9 C) (Oral)   Ht 5\' 7"  (1.702 m)   Wt 219 lb (99.3 kg)   SpO2 98%   BMI 34.30 kg/m  Vitals and nursing note reviewed.  General: 52 year old female, NAD HEENT: NCAT, EOMI, PERRLA, MMM Neck: supple, normal range of motion CV: RRR no MRG Lungs: CTA B, normal work of breathing Abdomen: soft, NT ND, positive bowel sounds Skin: warm, dry, no rash Extremities: warm and well perfused, normal tone Neuro: Alert, oriented x3, no focal deficits  Assessment & Plan:   Diabetic polyneuropathy associated with type 2 diabetes mellitus (HCC) Reports night cramps in bilateral lower extremities, most likely related to diabetic neuropathy. -Restart gabapentin,  100 mg TID   Diabetes mellitus without complication (HCC) Repeat A1c 12.4 following last visit.  POC A1c rechecked today and found to be elevated to 14.2. -Plan to restart metformin 500 mg BID x 1 week, followed by increasing to 1000 mg BID -Check BMP -Restart gabapentin for diabetic neuropathy  Orders Placed This Encounter  Procedures  . MM Digital Screening    Standing Status:   Future    Standing Expiration Date:   01/27/2019    Order Specific Question:   Reason for Exam (SYMPTOM  OR DIAGNOSIS REQUIRED)    Answer:   mass L breast    Order Specific Question:   Is the patient pregnant?    Answer:   No    Order Specific Question:   Preferred imaging location?    Answer:   [facility]  . Basic Metabolic Panel  . POCT glycosylated hemoglobin (Hb A1C)   Meds ordered this encounter  Medications  . gabapentin (NEURONTIN) 100 MG capsule    Sig: Take 1 capsule (100 mg total) by mouth 3 (three) times daily.    Dispense:  90 capsule    Refill:  2    Lovenia Kim, MD [place], PGY-2 12/06/2017 6:10 PM

## 2017-11-27 LAB — BASIC METABOLIC PANEL
BUN/Creatinine Ratio: 15 (ref 9–23)
BUN: 12 mg/dL (ref 6–24)
CALCIUM: 9.5 mg/dL (ref 8.7–10.2)
CHLORIDE: 97 mmol/L (ref 96–106)
CO2: 22 mmol/L (ref 20–29)
Creatinine, Ser: 0.82 mg/dL (ref 0.57–1.00)
GFR calc Af Amer: 96 mL/min/{1.73_m2} (ref >59)
GFR calc non Af Amer: 84 mL/min/{1.73_m2} (ref >59)
Glucose: 474 mg/dL — ABNORMAL HIGH (ref 65–99)
Potassium: 4.1 mmol/L (ref 3.5–5.2)
Sodium: 134 mmol/L (ref 134–144)

## 2017-12-06 ENCOUNTER — Encounter: Payer: Self-pay | Admitting: Family Medicine

## 2017-12-06 NOTE — Assessment & Plan Note (Signed)
Reports night cramps in bilateral lower extremities, most likely related to diabetic neuropathy. -Restart gabapentin, 100 mg TID

## 2017-12-06 NOTE — Assessment & Plan Note (Addendum)
Repeat A1c 12.4 following last visit.  POC A1c rechecked today and found to be elevated to 14.2. -Plan to restart metformin 500 mg BID x 1 week, followed by increasing to 1000 mg BID -Check BMP -Restart gabapentin for diabetic neuropathy

## 2017-12-11 ENCOUNTER — Encounter (HOSPITAL_COMMUNITY): Payer: Self-pay | Admitting: Emergency Medicine

## 2017-12-11 ENCOUNTER — Ambulatory Visit (HOSPITAL_COMMUNITY)
Admission: EM | Admit: 2017-12-11 | Discharge: 2017-12-11 | Disposition: A | Discharge status: Home / Self Care | Payer: Self-pay | Attending: Family Medicine | Admitting: Family Medicine

## 2017-12-11 ENCOUNTER — Other Ambulatory Visit: Payer: Self-pay

## 2017-12-11 DIAGNOSIS — R252 Cramp and spasm: Secondary | ICD-10-CM

## 2017-12-11 DIAGNOSIS — M7052 Other bursitis of knee, left knee: Secondary | ICD-10-CM

## 2017-12-11 MED ORDER — PREDNISONE 20 MG PO TABS: 40.0000 mg | ORAL_TABLET | Freq: Every day | ORAL | 0 refills | Status: AC

## 2017-12-11 NOTE — ED Triage Notes (Signed)
States has been having bilateral leg cramping x one month every PM, c/o left leg severe pain and burning with "knot" under knee onset Thursday

## 2017-12-11 NOTE — Discharge Instructions (Signed)
Ice/cold pack over area for 10-15 min at least twice daily.  OK to take Tylenol 1000 mg (2 extra strength tabs) or 975 mg (3 regular strength tabs) every 6 hours as needed.  Stay well hydrated.  Stretch the achy areas in your L leg as much as possible/able.  Consider a spoonful of pickle juice at night.

## 2017-12-11 NOTE — ED Provider Notes (Signed)
  [place]    CSN: [id number] Arrival date & time: 12/11/17  1335  Musculoskeletal Exam  Patient: Isabel Berger DOB: 02-07-1966  DOS: 12/11/2017  SUBJECTIVE:  Chief Complaint:   Chief Complaint  Patient presents with  . Leg Pain    Isabel Berger is a 52 y.o.  female for evaluation and treatment of L leg pain.   Onset:  1 month ago. No inj or change in activity.  Location: LLE Character:  aching and cramping  Progression of issue:  has worsened Associated symptoms: Pain/warmth just below knee Treatment: to date has been rest and stretching.   Neurovascular symptoms: no  ROS: Musculoskeletal/Extremities: +LLE pain  Past Medical History:  Diagnosis Date  . Depression   . Diabetes mellitus   . Hypertension     Objective: VITAL SIGNS: BP 131/80 (BP Location: Left Arm)   Pulse 79   Temp 97.7 F (36.5 C) (Oral)   SpO2 100%  Constitutional: Well formed, well developed. No acute distress. Cardiovascular: Brisk cap refill Thorax & Lungs: No accessory muscle use Musculoskeletal: LLE.   Normal active range of motion: yes.   Normal passive range of motion: yes Tenderness to palpation: yes, over calf, lateral compartment, quad and hamstring; very ttp over area just above tibial tubercle Deformity: no Ecchymosis: no There is warmth without fluctuance over area just above tibial tubercle; no erythema Neurologic: Normal sensory function. Antalgic gait Psychiatric: Normal mood. Age appropriate judgment and insight. Alert & oriented x 3.    Assessment:  Infrapatellar bursitis of left knee  Cramp and spasm  Plan: Ice, pred burst for bursitis, activity as tolerated, Tylenol.  For cramping, try to stretch muscles involved. Given numerous muscles involved, not likely dvt. Stay hydrated. Consider pickle juice at night, pcp could draw labs if considering electrolyte abn. F/u with pcp prn. The patient voiced understanding and agreement to the plan.      Shelda Pal, [place] 12/11/17 1523

## 2017-12-11 NOTE — ED Triage Notes (Deleted)
Pt has 2 cold sores on lips causing her pain

## 2017-12-23 ENCOUNTER — Emergency Department (HOSPITAL_BASED_OUTPATIENT_CLINIC_OR_DEPARTMENT_OTHER)
Admission: EM | Admit: 2017-12-23 | Discharge: 2017-12-23 | Disposition: A | Discharge status: Home / Self Care | Payer: Self-pay | Attending: Emergency Medicine | Admitting: Emergency Medicine

## 2017-12-23 ENCOUNTER — Emergency Department (HOSPITAL_BASED_OUTPATIENT_CLINIC_OR_DEPARTMENT_OTHER): Payer: Self-pay

## 2017-12-23 ENCOUNTER — Other Ambulatory Visit: Payer: Self-pay

## 2017-12-23 ENCOUNTER — Encounter (HOSPITAL_BASED_OUTPATIENT_CLINIC_OR_DEPARTMENT_OTHER): Payer: Self-pay | Admitting: *Deleted

## 2017-12-23 DIAGNOSIS — Z7984 Long term (current) use of oral hypoglycemic drugs: Secondary | ICD-10-CM | POA: Insufficient documentation

## 2017-12-23 DIAGNOSIS — R1010 Upper abdominal pain, unspecified: Secondary | ICD-10-CM

## 2017-12-23 DIAGNOSIS — I10 Essential (primary) hypertension: Secondary | ICD-10-CM | POA: Insufficient documentation

## 2017-12-23 DIAGNOSIS — M79662 Pain in left lower leg: Secondary | ICD-10-CM

## 2017-12-23 DIAGNOSIS — R1011 Right upper quadrant pain: Secondary | ICD-10-CM | POA: Insufficient documentation

## 2017-12-23 DIAGNOSIS — E119 Type 2 diabetes mellitus without complications: Secondary | ICD-10-CM | POA: Insufficient documentation

## 2017-12-23 DIAGNOSIS — R110 Nausea: Secondary | ICD-10-CM | POA: Insufficient documentation

## 2017-12-23 DIAGNOSIS — R2242 Localized swelling, mass and lump, left lower limb: Secondary | ICD-10-CM | POA: Insufficient documentation

## 2017-12-23 DIAGNOSIS — M79605 Pain in left leg: Secondary | ICD-10-CM | POA: Insufficient documentation

## 2017-12-23 DIAGNOSIS — M7989 Other specified soft tissue disorders: Secondary | ICD-10-CM

## 2017-12-23 LAB — CBC WITH DIFFERENTIAL/PLATELET
Basophils Absolute: 0 10*3/uL (ref 0.0–0.1)
Basophils Relative: 0 %
EOS PCT: 0 %
Eosinophils Absolute: 0 10*3/uL (ref 0.0–0.7)
HCT: 38 % (ref 36.0–46.0)
Hemoglobin: 13.5 g/dL (ref 12.0–15.0)
LYMPHS ABS: 3.8 10*3/uL (ref 0.7–4.0)
Lymphocytes Relative: 44 %
MCH: 31.2 pg (ref 26.0–34.0)
MCHC: 35.5 g/dL (ref 30.0–36.0)
MCV: 87.8 fL (ref 78.0–100.0)
MONO ABS: 0.6 10*3/uL (ref 0.1–1.0)
Monocytes Relative: 7 %
Neutro Abs: 4.2 10*3/uL (ref 1.7–7.7)
Neutrophils Relative %: 49 %
PLATELETS: 339 10*3/uL (ref 150–400)
RBC: 4.33 MIL/uL (ref 3.87–5.11)
RDW: 12.3 % (ref 11.5–15.5)
WBC: 8.7 10*3/uL (ref 4.0–10.5)

## 2017-12-23 LAB — URINALYSIS, ROUTINE W REFLEX MICROSCOPIC
Bilirubin Urine: NEGATIVE
Glucose, UA: >=500 mg/dL — AB
KETONES UR: 15 mg/dL — AB
LEUKOCYTES UA: NEGATIVE
Nitrite: NEGATIVE
PH: 5.5 (ref 5.0–8.0)
Protein, ur: NEGATIVE mg/dL
Specific Gravity, Urine: 1.015 (ref 1.005–1.030)

## 2017-12-23 LAB — URINALYSIS, MICROSCOPIC (REFLEX): BACTERIA UA: NONE SEEN

## 2017-12-23 LAB — COMPREHENSIVE METABOLIC PANEL
ALBUMIN: 4.1 g/dL (ref 3.5–5.0)
ALT: 23 U/L (ref 14–54)
AST: 21 U/L (ref 15–41)
Alkaline Phosphatase: 146 U/L — ABNORMAL HIGH (ref 38–126)
Anion gap: 11 (ref 5–15)
BUN: 11 mg/dL (ref 6–20)
CHLORIDE: 99 mmol/L — AB (ref 101–111)
CO2: 22 mmol/L (ref 22–32)
Calcium: 9.3 mg/dL (ref 8.9–10.3)
Creatinine, Ser: 0.76 mg/dL (ref 0.44–1.00)
GFR calc Af Amer: >60 mL/min (ref >60)
GFR calc non Af Amer: >60 mL/min (ref >60)
GLUCOSE: 408 mg/dL — AB (ref 65–99)
POTASSIUM: 3.7 mmol/L (ref 3.5–5.1)
SODIUM: 132 mmol/L — AB (ref 135–145)
Total Bilirubin: 0.7 mg/dL (ref 0.3–1.2)
Total Protein: 7.8 g/dL (ref 6.5–8.1)

## 2017-12-23 LAB — LIPASE, BLOOD: Lipase: 54 U/L — ABNORMAL HIGH (ref 11–51)

## 2017-12-23 LAB — D-DIMER, QUANTITATIVE: D-Dimer, Quant: 0.52 ug/mL-FEU — ABNORMAL HIGH (ref 0.00–0.50)

## 2017-12-23 MED ORDER — MORPHINE SULFATE (PF) 4 MG/ML IV SOLN
4.0000 mg | Freq: Once | INTRAVENOUS | Status: AC
  Administered 2017-12-23: 4 mg via INTRAVENOUS
  Filled 2017-12-23: qty 1

## 2017-12-23 MED ORDER — ONDANSETRON 4 MG PO TBDP
4.0000 mg | ORAL_TABLET | Freq: Once | ORAL | Status: AC
  Administered 2017-12-23: 4 mg via ORAL
  Filled 2017-12-23: qty 1

## 2017-12-23 MED ORDER — IOPAMIDOL (ISOVUE-370) INJECTION 76%
100.0000 mL | Freq: Once | INTRAVENOUS | Status: AC | PRN
  Administered 2017-12-23: 82 mL via INTRAVENOUS

## 2017-12-23 MED ORDER — LORAZEPAM 2 MG/ML IJ SOLN
1.0000 mg | Freq: Once | INTRAMUSCULAR | Status: AC
  Administered 2017-12-23: 1 mg via INTRAVENOUS
  Filled 2017-12-23: qty 1

## 2017-12-23 MED ORDER — DICYCLOMINE HCL 20 MG PO TABS: 20.0000 mg | ORAL_TABLET | Freq: Two times a day (BID) | ORAL | 0 refills | Status: DC

## 2017-12-23 MED ORDER — NAPROXEN 500 MG PO TABS: 500.0000 mg | ORAL_TABLET | Freq: Two times a day (BID) | ORAL | 0 refills | Status: DC

## 2017-12-23 MED ORDER — FENTANYL CITRATE (PF) 100 MCG/2ML IJ SOLN
100.0000 ug | Freq: Once | INTRAMUSCULAR | Status: AC
  Administered 2017-12-23: 100 ug via INTRAVENOUS
  Filled 2017-12-23: qty 2

## 2017-12-23 MED ORDER — SODIUM CHLORIDE 0.9 % IV BOLUS
1000.0000 mL | Freq: Once | INTRAVENOUS | Status: AC
Start: 2017-12-23 — End: 2017-12-23
  Administered 2017-12-23: 1000 mL via INTRAVENOUS

## 2017-12-23 MED ORDER — ONDANSETRON HCL 4 MG PO TABS: 4.0000 mg | ORAL_TABLET | Freq: Three times a day (TID) | ORAL | 0 refills | Status: DC | PRN

## 2017-12-23 MED ORDER — ONDANSETRON HCL 4 MG/2ML IJ SOLN
4.0000 mg | Freq: Once | INTRAMUSCULAR | Status: AC
  Administered 2017-12-23: 4 mg via INTRAVENOUS
  Filled 2017-12-23: qty 2

## 2017-12-23 MED ORDER — METHOCARBAMOL 500 MG PO TABS
500.0000 mg | ORAL_TABLET | Freq: Once | ORAL | Status: AC
  Administered 2017-12-23: 500 mg via ORAL
  Filled 2017-12-23: qty 1
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
## 2017-12-23 NOTE — ED Notes (Signed)
Pt on monitor 

## 2017-12-23 NOTE — Discharge Instructions (Signed)
Please read and follow all provided instructions You have been seen today for your complaint of: Upper abdominal pain Your lab work: Included CBC, CMP, lipase and d-dimer.  Your lipase was mildly elevated today but not consistent with pancreatitis.  Your d-dimer was elevated which resulted in obtaining a CT scan of your chest Your imaging: Your right upper quadrant ultrasound did not show evidence of gallbladder pathology. This did show a pancreatic duct is slightly prominent at 3 mm.  Recommend outpatient imaging and follow-up with GI.  Please call for follow-up this week.  Your CT a of the chest did not show evidence of a blood clot in your lungs.  The ultrasound of your leg did not show evidence of a blood clot in your legs. Vital signs: See below  Abdominal Pain  Your exam might not show the exact reason you have abdominal pain. Since there are many different causes of abdominal pain, another checkup and more tests may be needed. It is very important to follow up for lasting (persistent) or worsening symptoms. A possible cause of abdominal pain in any person who still has his or her appendix is acute appendicitis. Appendicitis is often hard to diagnose. Normal blood tests, urine tests, ultrasound, and CT scans do not completely rule out early appendicitis or other causes of abdominal pain. Sometimes, only the changes that happen over time will allow appendicitis and other causes of abdominal pain to be determined. Other potential problems that may require surgery may also take time to become more apparent. Because of this, it is important that you follow all of the instructions below.   HOME CARE INSTRUCTIONS  Do not take laxatives unless directed by your caregiver. Rest as much as possible.  Do not eat solid food until your pain is gone: A diet of water, weak decaffeinated tea, broth or bouillon, gelatin, oral rehydration solutions (ORS), frozen ice pops, or ice chips may be helpful.  When pain is  gone: Start a light diet (dry toast, crackers, applesauce, or white rice). Increase the diet slowly as long as it does not bother you. Eat no dairy products (including cheese and eggs) and no spicy, fatty, fried, or high-fiber foods.  Use no alcohol, caffeine, or cigarettes.  Take your regular medicines unless your caregiver told you not to.  Take any prescribed medicine as directed.   SEEK IMMEDIATE MEDICAL CARE IF:  The pain does not go away.  You have a fever >101 that does not go down with medication. You keep throwing up (vomiting) or cannot drink liquids.  You have shaking chills (rigors) The pain becomes localized (Pain in the right side could possibly be appendicitis. In an adult, pain in the left lower portion of the abdomen could be colitis or diverticulitis). You pass bloody or black tarry stools.  There is bright red blood in the stool.  There is blood in your vomit. Your bowel movements stop (become blocked) or you cannot pass gas.  The constipation stays for more than 4 days.  You have bloody, frequent, or painful urination.  You have yellow discoloration in the skin or whites of the eyes.  Your stomach becomes bloated or bigger.  You have dizziness or fainting.  You have chest or back pain. You have rectal pain.  You do not seem to be getting better.  You have any questions or concerns.   Your vital signs today were: BP 127/89    Pulse 76    Temp 98.2 F (36.8  C) (Oral)    Resp 13    Ht 5\' 7"  (1.702 m)    Wt 99.3 kg (219 lb)    SpO2 96%    BMI 34.30 kg/m  If your blood pressure (bp) was elevated above 135/85 this visit, please have this repeated by your doctor within one month.

## 2017-12-23 NOTE — ED Triage Notes (Signed)
Right upper quad pain x 3 days.

## 2017-12-28 ENCOUNTER — Ambulatory Visit (INDEPENDENT_AMBULATORY_CARE_PROVIDER_SITE_OTHER): Payer: Self-pay | Admitting: Family Medicine

## 2017-12-28 ENCOUNTER — Other Ambulatory Visit: Payer: Self-pay

## 2017-12-28 ENCOUNTER — Encounter: Payer: Self-pay | Admitting: Family Medicine

## 2017-12-28 VITALS — BP 108/72 | HR 91 | Temp 98.5°F | Ht 67.0 in | Wt 208.0 lb

## 2017-12-28 DIAGNOSIS — R252 Cramp and spasm: Secondary | ICD-10-CM

## 2017-12-28 DIAGNOSIS — M7052 Other bursitis of knee, left knee: Secondary | ICD-10-CM | POA: Insufficient documentation

## 2017-12-28 DIAGNOSIS — E1142 Type 2 diabetes mellitus with diabetic polyneuropathy: Secondary | ICD-10-CM

## 2017-12-28 DIAGNOSIS — E119 Type 2 diabetes mellitus without complications: Secondary | ICD-10-CM

## 2017-12-28 NOTE — Progress Notes (Signed)
   Subjective:   Patient ID: Isabel Berger    DOB: 01-24-1966, 52 y.o. female   MRN: [id number]  CC: f/u diabetes   HPI: Isabel Berger is a 52 y.o. female who presents to clinic today for f/u diabetes.  L leg swelling Pt seen in ED by Dr. Nani Ravens on 4/20 for pain and swelling of left knee and lower leg. She was prescribed a prednisone burst which provided some relief.  DVT ruled out with negative dopplers and CTA negative for PE. Right leg is unaffected.  She endorses mild calf tenderness.  No h/o DVT or PE and no known risk factors for clot.  Denies numbness or tingling in legs. No weakness noted, and denies SOB, CP.  She has been doing gentle stretching exercises in addition to applying ice.   T2DM A1c 12.4 at OV, has been off medications x1 year due to multiple stressors and letting go of taking care of herself.  She is now motivated to get her diabetes under control and we restarted her Metformin at last visit.   -exercise: walks a lot, has lost 15 lbs  -diet: eating better, has been grilling and boiling her meals, stays away from fried foods  -checking sugars -meds: restarted Metformin 1000 mg BID 1 month ago, gabapentin 200 mg TID restarted for diabetic neuropathy  -night cramps have improved somewhat but continue to be bothersome  -medication compliance: good  -diabetic eye exam 4/29 - she missed the appt and will reschedule   ROS: See HPI for pertinent ROS.  Social: pt is a current never smoker  Medications reviewed.  Objective:   BP 108/72   Pulse 91   Temp 98.5 F (36.9 C) (Oral)   Ht 5\' 7"  (1.702 m)   Wt 208 lb (94.3 kg)   SpO2 99%   BMI 32.58 kg/m  Vitals and nursing note reviewed.  General: 52 y/o AA female  HEENT: NCAT, EOMI, PERRL, MMM, o/p clear  Neck: supple, no LAD  CV: RRR no MRG  Lungs: CTAB, normal effort  Abdomen: soft, NTND, +bs  Ext: negative Homan's sign, no erythema or tenderness over left calf, 2+ pedal pulses  Skin: warm, dry, no  rash  Assessment & Plan:    Diabetic polyneuropathy associated with type 2 diabetes mellitus (HCC) Stable. Continue gabapentin.  May consider titrating as she has room to go up on this.  T2DM (type 2 diabetes mellitus) ([place]) Good compliance on metformin.   -Counseled on diet/exercise -recheck A1c in 2 months to see if improving   Infrapatellar bursitis of left knee No fluctuance on exam.  DVT ruled out with negative dopplers and no PE on CTA.   -recommend applying ice, activity as tolerated -may take tylenol prn  -recommend continue gentle stretching exercises -advised weight loss   Follow up: 4-6 weeks or sooner if needed   Lovenia Kim, MD [place], PGY-2 12/28/2017 5:56 PM

## 2017-12-28 NOTE — Assessment & Plan Note (Signed)
No fluctuance on exam.  DVT ruled out with negative dopplers and no PE on CTA.   -recommend applying ice, activity as tolerated -may take tylenol prn  -recommend continue gentle stretching exercises -advised weight loss

## 2017-12-28 NOTE — Assessment & Plan Note (Signed)
Good compliance on metformin.   -Counseled on diet/exercise -recheck A1c in 2 months to see if improving

## 2017-12-28 NOTE — Patient Instructions (Addendum)
It was nice seeing you again today.  You were seen for leg swelling and pain which is likely due to bursitis of your left knee.  As we discussed, ice, rest, activity as tolerated and gentle stretching exercises can help alleviate some of this pain.  This can sometimes take several weeks to heal.  The good news is your symptoms are not due to a blood clot as this was normal on imaging in the ED.  If you have any new or worsening symptoms, please make an appointment to be seen by provider.  Additionally, we discussed your diabetes.  I would recommend continuing metformin 1000 mg twice a day and as well as your gabapentin for leg cramps.  We will recheck your A1c in a few months to see if there has been any improvement with restarting her medications.  I would like for you to reschedule your eye appointment as annual eye exams are important in individuals with diabetes.  Please call clinic if you have any questions.  Be well, Lovenia Kim MD    Bursitis Bursitis is when the fluid-filled sac (bursa) that covers and protects a joint is swollen (inflamed). Bursitis is most common near joints, especially the knees, elbows, hips, and shoulders. Follow these instructions at home:  Take medicines only as told by your doctor.  If you were prescribed an antibiotic medicine, finish it all even if you start to feel better.  Rest the affected area as told by your doctor. ? Keep the area raised up. ? Avoid doing things that make the pain worse.  Apply ice to the injured area: ? Place ice in a plastic bag. ? Place a towel between your skin and the bag. ? Leave the ice on for 20 minutes, 2-3 times a day.  Use splints, braces, pads, or walking aids as told by your doctor.  Keep all follow-up visits as told by your doctor. This is important. Contact a doctor if:  You have more pain with home care.  You have a fever.  You have chills. This information is not intended to replace advice given to you by  your health care provider. Make sure you discuss any questions you have with your health care provider. Document Released: 01/28/2010 Document Revised: 01/16/2016 Document Reviewed: 10/30/2013 Elsevier Interactive Patient Education  2018 [employer].

## 2017-12-28 NOTE — Assessment & Plan Note (Signed)
Stable. Continue gabapentin.  May consider titrating as she has room to go up on this.

## 2018-01-12 ENCOUNTER — Other Ambulatory Visit: Payer: Self-pay | Admitting: Family Medicine

## 2018-01-12 DIAGNOSIS — Z1231 Encounter for screening mammogram for malignant neoplasm of breast: Secondary | ICD-10-CM

## 2018-02-10 ENCOUNTER — Other Ambulatory Visit (HOSPITAL_COMMUNITY): Payer: Self-pay | Admitting: *Deleted

## 2018-02-10 DIAGNOSIS — N6320 Unspecified lump in the left breast, unspecified quadrant: Secondary | ICD-10-CM

## 2018-03-10 ENCOUNTER — Ambulatory Visit (HOSPITAL_COMMUNITY): Payer: Self-pay

## 2018-03-10 ENCOUNTER — Other Ambulatory Visit: Payer: Self-pay

## 2018-03-11 ENCOUNTER — Telehealth: Payer: Self-pay | Admitting: Family Medicine

## 2018-03-11 NOTE — Telephone Encounter (Signed)
LVM to schedule f/u appt. Please assist with this

## 2018-03-28 ENCOUNTER — Other Ambulatory Visit: Payer: Self-pay | Admitting: Obstetrics and Gynecology

## 2018-03-28 DIAGNOSIS — Z1231 Encounter for screening mammogram for malignant neoplasm of breast: Secondary | ICD-10-CM

## 2018-05-19 ENCOUNTER — Ambulatory Visit (INDEPENDENT_AMBULATORY_CARE_PROVIDER_SITE_OTHER): Payer: Self-pay | Admitting: Family Medicine

## 2018-05-19 DIAGNOSIS — E119 Type 2 diabetes mellitus without complications: Secondary | ICD-10-CM

## 2018-05-19 DIAGNOSIS — R739 Hyperglycemia, unspecified: Secondary | ICD-10-CM

## 2018-05-19 LAB — POCT GLYCOSYLATED HEMOGLOBIN (HGB A1C): HBA1C, POC (CONTROLLED DIABETIC RANGE): 14.5 % — AB (ref 0.0–7.0)

## 2018-05-19 LAB — GLUCOSE, POCT (MANUAL RESULT ENTRY): POC Glucose: 484 mg/dl — AB (ref 70–99)

## 2018-05-19 MED ORDER — INSULIN GLARGINE 100 UNIT/ML SOLOSTAR PEN: 10.0000 [IU] | PEN_INJECTOR | Freq: Every day | SUBCUTANEOUS | 0 refills | Status: DC

## 2018-05-19 NOTE — Progress Notes (Signed)
   Subjective:   Patient ID: Isabel Berger    DOB: Nov 08, 1965, 52 y.o. female   MRN: [id number]  CC: elevated blood sugars   HPI: Isabel Berger is a 52 y.o. female who presents to clinic today for the following issue.   Elevated blood sugars  She reports she checks CBGs every morning, however this morning felt dizzy with some vision blurring.  Checked her CBG and it was 378. She took her Metformin this morning. She has since then only had peanut butter crackers and ice water.  Today in office her sugars are elevated to 487.  She recalls it was 457 on Tuesday night when she had dry heaves.  She tried to throw up but did not, had abdominal pain and sweating. She is not on insulin.  Taking Metformin 500 mg BID with meals with good compliance.  Currently without abdominal pain, nausea, vomiting.  No recent viral illnesses.      ROS: No fever, chills, nausea, vomiting, abdominal pain.    Social: pt is a never smoker.  Medications reviewed. Objective:   Vitals and nursing note reviewed, no concerns identified.   General: 52 year old female, NAD HEENT: NCAT, EOMI, PERRL, MMM, oropharynx clear  CV: RRR no MRG  Lungs: CTAB, normal effort  Abdomen: soft, NTND, +bs  Skin: warm, dry, no rash, cap refill under 2 seconds Extremities: warm and well perfused, normal tone Neuro: alert, oriented x3, no focal deficits   Assessment & Plan:     Elevated blood sugar Last A1c 5 months ago was 14.2, today up to 14.5.  CBG in office elevated to 487.  Vitals within normal and physical exam unremarkable.  Low suspicion for DKA at this time and will hold off on sending her to the ER.  I have ordered a stat CMP and CBC for further evaluation.  She was given 10 units of insulin in the office today to help bring her blood sugars down.  Pharmacy team worked with her to teach her insulin administration at home.  Sent home with Lantus, she is to take 10 units at bedtime tonight.  Advised to check blood sugars 4  times a day and keep a log of CBGs.  Have set up a follow up appt with Dr. Valentina Lucks for diabetes management and teaching.   Orders Placed This Encounter  Procedures  . CBC  . Basic metabolic panel  . HgB A1c  . Glucose (CBG)   Meds ordered this encounter  Medications  . Insulin Glargine (LANTUS) 100 UNIT/ML Solostar Pen    Sig: Inject 10-100 Units into the skin daily. Increase by 2 units for blood glucose above 150.    Dispense:  2 pen    Refill:  0    Sample    Order Specific Question:   Lot Number?    Answer:   [id number]    Order Specific Question:   Expiration Date?    Answer:   05/23/2020    Order Specific Question:   Quantity    Answer:   2   Isabel Kim, MD [place], PGY-3 05/26/2018 4:58 PM

## 2018-05-19 NOTE — Patient Instructions (Signed)
It was nice seeing you again today.  You were seen in clinic for elevated blood sugars, we checked your blood sugar here and it was 487.  We ordered some blood work and have given you 10 units of insulin in the office to help bring your sugar down.  The pharmacy team also taught you how to administer insulin at home.  You were sent home with Lantus and should take 10 units tonight.   I would recommend continuing to check your blood sugars at home to make sure you are not hypoglycemic prior to injecting.  Please call clinic tomorrow to let us know what your blood sugars are.  You can keep a log of these to keep track.  In the meantime, if you have any abdominal pain, nausea, vomiting I would like you to be seen in the ER as you may have something called diabetic ketoacidosis.   Lovenia Kim MD

## 2018-05-20 LAB — BASIC METABOLIC PANEL
BUN / CREAT RATIO: 24 — AB (ref 9–23)
BUN: 16 mg/dL (ref 6–24)
CHLORIDE: 94 mmol/L — AB (ref 96–106)
CO2: 24 mmol/L (ref 20–29)
Calcium: 9.5 mg/dL (ref 8.7–10.2)
Creatinine, Ser: 0.68 mg/dL (ref 0.57–1.00)
GFR calc Af Amer: 117 mL/min/{1.73_m2} (ref >59)
GFR calc non Af Amer: 102 mL/min/{1.73_m2} (ref >59)
GLUCOSE: 454 mg/dL — AB (ref 65–99)
POTASSIUM: 3.8 mmol/L (ref 3.5–5.2)
SODIUM: 129 mmol/L — AB (ref 134–144)

## 2018-05-20 LAB — CBC
Hematocrit: 39.4 % (ref 34.0–46.6)
Hemoglobin: 13 g/dL (ref 11.1–15.9)
MCH: 29.8 pg (ref 26.6–33.0)
MCHC: 33 g/dL (ref 31.5–35.7)
MCV: 90 fL (ref 79–97)
PLATELETS: 376 10*3/uL (ref 150–450)
RBC: 4.36 x10E6/uL (ref 3.77–5.28)
RDW: 13 % (ref 12.3–15.4)
WBC: 11 10*3/uL — ABNORMAL HIGH (ref 3.4–10.8)

## 2018-05-26 DIAGNOSIS — R739 Hyperglycemia, unspecified: Secondary | ICD-10-CM | POA: Insufficient documentation

## 2018-05-26 NOTE — Assessment & Plan Note (Signed)
Last A1c 5 months ago was 14.2, today up to 14.5.  CBG in office elevated to 487.  Vitals within normal and physical exam unremarkable.  Low suspicion for DKA at this time and will hold off on sending her to the ER.  I have ordered a stat CMP and CBC for further evaluation.  She was given 10 units of insulin in the office today to help bring her blood sugars down.  Pharmacy team worked with her to teach her insulin administration at home.  Sent home with Lantus, she is to take 10 units at bedtime tonight.  Advised to check blood sugars 4 times a day and keep a log of CBGs.  Have set up a follow up appt with Dr. Valentina Lucks for diabetes management and teaching.

## 2018-06-01 ENCOUNTER — Ambulatory Visit: Payer: Self-pay | Admitting: Family Medicine

## 2018-06-02 ENCOUNTER — Other Ambulatory Visit: Payer: Self-pay

## 2018-06-02 ENCOUNTER — Other Ambulatory Visit (HOSPITAL_COMMUNITY): Payer: Self-pay | Admitting: *Deleted

## 2018-06-02 ENCOUNTER — Ambulatory Visit
Admission: RE | Admit: 2018-06-02 | Discharge: 2018-06-02 | Disposition: A | Discharge status: Home / Self Care | Payer: No Typology Code available for payment source | Source: Ambulatory Visit | Attending: Obstetrics and Gynecology | Admitting: Obstetrics and Gynecology

## 2018-06-02 ENCOUNTER — Encounter (HOSPITAL_COMMUNITY): Payer: Self-pay

## 2018-06-02 ENCOUNTER — Ambulatory Visit (HOSPITAL_COMMUNITY)
Admission: RE | Admit: 2018-06-02 | Discharge: 2018-06-02 | Disposition: A | Discharge status: Home / Self Care | Payer: Self-pay | Source: Ambulatory Visit | Attending: Obstetrics and Gynecology | Admitting: Obstetrics and Gynecology

## 2018-06-02 VITALS — BP 120/82 | Wt 198.2 lb

## 2018-06-02 DIAGNOSIS — N6320 Unspecified lump in the left breast, unspecified quadrant: Secondary | ICD-10-CM

## 2018-06-02 DIAGNOSIS — Z1239 Encounter for other screening for malignant neoplasm of breast: Secondary | ICD-10-CM

## 2018-06-02 DIAGNOSIS — N644 Mastodynia: Secondary | ICD-10-CM

## 2018-06-02 DIAGNOSIS — Z1231 Encounter for screening mammogram for malignant neoplasm of breast: Secondary | ICD-10-CM

## 2018-06-02 NOTE — Progress Notes (Signed)
Complaints of left breast lump x 4 years that started bleeding and pus came out of it one month ago. Patient states the lump has increased in sized. Patient complained of skin changes bilateral inner breasts. Patient complained about left breast pain that comes and goes. Patient rates the pin at a 10 out of 10.  Pap Smear: Pap smear not completed today. Last Pap [place] was [place] [address] and normal per patient. Per patient has no history of an abnormal Pap smear. Patient has a history of a hysterectomy 05/24/2003 due to fibroids. Patient no longer needs Pap smears due to her history of a hysterectomy for benign reasons per BCCCP and ACOG guidelines. No Pap smear results in EPIC.  Physical exam: Breasts Breasts symmetrical. Bilateral inner breast darkened dry appearing skin that per patient is a change for her. No nipple retraction bilateral breasts. No nipple discharge bilateral breasts. No lymphadenopathy. No lumps palpated right breast. Palpated a darkened lump within the left breast that appears to be superficial. The lump is the size of a pea and is located at 10 o'clock 9 cm from the nipple. Complaints of tenderness when palpated lump. Referred patient to the [facility] for a diagnostic mammogram and possible left breast ultrasound. Appointment scheduled for Thursday, June 02, 2018 at 1310.        Pelvic/Bimanual No Pap smear completed today since patient has a history of a hysterectomy for benign reasons. Pap smear not indicated per BCCCP guidelines.   Smoking History: Patient has never smoked.  Patient Navigation: Patient education provided. Access to services provided for patient through [facility] program. Spanish interpreter provided.   Colorectal Cancer Screening: Per patient has never had a colonoscopy completed. No complaints today. FIT Test given to patient to complete and return to BCCCP.  Breast and Cervical Cancer Risk Assessment: Patient has a family history of a  maternal aunt having breast cancer. Patient has no known genetic mutations or history of radiation treatment to the chest before age 20. Patient has no history of cervical dysplasia, immunocompromised, or DES exposure in-utero.  Risk Assessment    Risk Scores      06/02/2018   Last edited by: Armond Hang, LPN   5-year risk: 1.2 %   Lifetime risk: 8.5 %

## 2018-06-02 NOTE — Patient Instructions (Signed)
Explained breast self awareness with Darolyn Rua Crigger. Patient did not need a Pap smear today due to patient has a history of a hysterectomy for benign reasons. Let patient know that she doesn't need any further Pap smears due to her history of a hysterectomy for benign reasons. Referred patient to the [facility] for a diagnostic mammogram and possible left breast ultrasound. Appointment scheduled for Thursday, June 02, 2018 at 1310. Darolyn Rua Messman verbalized understanding.  Jearl Soto, [name], RN 2:12 PM

## 2018-06-03 ENCOUNTER — Other Ambulatory Visit: Payer: Self-pay

## 2018-06-07 ENCOUNTER — Telehealth: Payer: Self-pay | Admitting: Family Medicine

## 2018-06-07 NOTE — Telephone Encounter (Signed)
Rescheduled pt with Dr. Ane Payment due to an ASAP check up needed.

## 2018-06-08 ENCOUNTER — Other Ambulatory Visit: Payer: Self-pay

## 2018-06-08 ENCOUNTER — Encounter (HOSPITAL_BASED_OUTPATIENT_CLINIC_OR_DEPARTMENT_OTHER): Payer: Self-pay | Admitting: Emergency Medicine

## 2018-06-08 ENCOUNTER — Emergency Department (HOSPITAL_BASED_OUTPATIENT_CLINIC_OR_DEPARTMENT_OTHER)
Admission: EM | Admit: 2018-06-08 | Discharge: 2018-06-08 | Disposition: A | Discharge status: Home / Self Care | Payer: No Typology Code available for payment source | Attending: Emergency Medicine | Admitting: Emergency Medicine

## 2018-06-08 DIAGNOSIS — I10 Essential (primary) hypertension: Secondary | ICD-10-CM | POA: Insufficient documentation

## 2018-06-08 DIAGNOSIS — E1165 Type 2 diabetes mellitus with hyperglycemia: Secondary | ICD-10-CM | POA: Insufficient documentation

## 2018-06-08 DIAGNOSIS — Z79899 Other long term (current) drug therapy: Secondary | ICD-10-CM | POA: Insufficient documentation

## 2018-06-08 DIAGNOSIS — Z794 Long term (current) use of insulin: Secondary | ICD-10-CM | POA: Insufficient documentation

## 2018-06-08 DIAGNOSIS — E1142 Type 2 diabetes mellitus with diabetic polyneuropathy: Secondary | ICD-10-CM | POA: Insufficient documentation

## 2018-06-08 DIAGNOSIS — R739 Hyperglycemia, unspecified: Secondary | ICD-10-CM

## 2018-06-08 LAB — CBC WITH DIFFERENTIAL/PLATELET
Abs Immature Granulocytes: 0.02 10*3/uL (ref 0.00–0.07)
Basophils Absolute: 0.1 10*3/uL (ref 0.0–0.1)
Basophils Relative: 1 %
EOS ABS: 0.1 10*3/uL (ref 0.0–0.5)
EOS PCT: 1 %
HCT: 39.8 % (ref 36.0–46.0)
HEMOGLOBIN: 13 g/dL (ref 12.0–15.0)
Immature Granulocytes: 0 %
Lymphocytes Relative: 46 %
Lymphs Abs: 5.1 10*3/uL — ABNORMAL HIGH (ref 0.7–4.0)
MCH: 29.5 pg (ref 26.0–34.0)
MCHC: 32.7 g/dL (ref 30.0–36.0)
MCV: 90.2 fL (ref 80.0–100.0)
Monocytes Absolute: 0.6 10*3/uL (ref 0.1–1.0)
Monocytes Relative: 6 %
NRBC: 0 % (ref 0.0–0.2)
Neutro Abs: 5 10*3/uL (ref 1.7–7.7)
Neutrophils Relative %: 46 %
Platelets: 367 10*3/uL (ref 150–400)
RBC: 4.41 MIL/uL (ref 3.87–5.11)
RDW: 11.8 % (ref 11.5–15.5)
WBC: 10.8 10*3/uL — AB (ref 4.0–10.5)

## 2018-06-08 LAB — I-STAT VENOUS BLOOD GAS, ED
ACID-BASE DEFICIT: 2 mmol/L (ref 0.0–2.0)
BICARBONATE: 23.8 mmol/L (ref 20.0–28.0)
O2 SAT: 49 %
TCO2: 25 mmol/L (ref 22–32)
pCO2, Ven: 43.1 mmHg — ABNORMAL LOW (ref 44.0–60.0)
pH, Ven: 7.35 (ref 7.250–7.430)
pO2, Ven: 28 mmHg — CL (ref 32.0–45.0)

## 2018-06-08 LAB — URINALYSIS, ROUTINE W REFLEX MICROSCOPIC
BILIRUBIN URINE: NEGATIVE
Glucose, UA: >=500 mg/dL — AB
Ketones, ur: NEGATIVE mg/dL
NITRITE: NEGATIVE
PH: 6 (ref 5.0–8.0)
Protein, ur: NEGATIVE mg/dL
SPECIFIC GRAVITY, URINE: 1.01 (ref 1.005–1.030)

## 2018-06-08 LAB — COMPREHENSIVE METABOLIC PANEL
ALBUMIN: 4.1 g/dL (ref 3.5–5.0)
ALK PHOS: 131 U/L — AB (ref 38–126)
ALT: 17 U/L (ref 0–44)
ANION GAP: 11 (ref 5–15)
AST: 14 U/L — ABNORMAL LOW (ref 15–41)
BILIRUBIN TOTAL: 0.5 mg/dL (ref 0.3–1.2)
BUN: 11 mg/dL (ref 6–20)
CALCIUM: 9.4 mg/dL (ref 8.9–10.3)
CO2: 24 mmol/L (ref 22–32)
Chloride: 96 mmol/L — ABNORMAL LOW (ref 98–111)
Creatinine, Ser: 0.63 mg/dL (ref 0.44–1.00)
GFR calc Af Amer: >60 mL/min (ref >60)
GFR calc non Af Amer: >60 mL/min (ref >60)
GLUCOSE: 433 mg/dL — AB (ref 70–99)
Potassium: 3.3 mmol/L — ABNORMAL LOW (ref 3.5–5.1)
Sodium: 131 mmol/L — ABNORMAL LOW (ref 135–145)
TOTAL PROTEIN: 8 g/dL (ref 6.5–8.1)

## 2018-06-08 LAB — URINALYSIS, MICROSCOPIC (REFLEX)

## 2018-06-08 LAB — CBG MONITORING, ED: Glucose-Capillary: 487 mg/dL — ABNORMAL HIGH (ref 70–99)

## 2018-06-08 MED ORDER — SODIUM CHLORIDE 0.9 % IV BOLUS
1000.0000 mL | Freq: Once | INTRAVENOUS | Status: AC
  Administered 2018-06-08: 1000 mL via INTRAVENOUS

## 2018-06-08 MED ORDER — ACETAMINOPHEN 325 MG PO TABS
650.0000 mg | ORAL_TABLET | Freq: Once | ORAL | Status: AC
  Administered 2018-06-08: 650 mg via ORAL
  Filled 2018-06-08: qty 2

## 2018-06-08 NOTE — ED Notes (Signed)
Checked cbg at work  cbg  Was 569  Some blurred vision  Did have ha which has cleared,

## 2018-06-08 NOTE — Discharge Instructions (Addendum)
Follow-up with primary care doctor as scheduled this week.  Follow-up with endocrinology as followed.  Continue to track your blood sugars at home.

## 2018-06-08 NOTE — ED Triage Notes (Addendum)
Reports high blood sugar of 562 today at work. She recently was started on insulin. Also reports pain to her skin since starting on lantus.
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
## 2018-06-09 ENCOUNTER — Telehealth: Payer: Self-pay | Admitting: Family Medicine

## 2018-06-09 NOTE — Telephone Encounter (Signed)
I called to remind her of her appointment with me tomorrow.  She stated that she is having redness at her insulin injection site and will need an appointment for it. I informed her that I can assess it tomorrow. However, if it worsens, she is advised to go to the ED. She agreed with the plan.

## 2018-06-10 ENCOUNTER — Ambulatory Visit (INDEPENDENT_AMBULATORY_CARE_PROVIDER_SITE_OTHER): Payer: Self-pay | Admitting: Family Medicine

## 2018-06-10 ENCOUNTER — Other Ambulatory Visit: Payer: Self-pay

## 2018-06-10 ENCOUNTER — Encounter: Payer: Self-pay | Admitting: Family Medicine

## 2018-06-10 VITALS — BP 118/80 | HR 69 | Temp 98.1°F | Wt 197.0 lb

## 2018-06-10 DIAGNOSIS — Z23 Encounter for immunization: Secondary | ICD-10-CM

## 2018-06-10 DIAGNOSIS — R51 Headache: Secondary | ICD-10-CM

## 2018-06-10 DIAGNOSIS — R519 Headache, unspecified: Secondary | ICD-10-CM

## 2018-06-10 DIAGNOSIS — E119 Type 2 diabetes mellitus without complications: Secondary | ICD-10-CM

## 2018-06-10 DIAGNOSIS — B372 Candidiasis of skin and nail: Secondary | ICD-10-CM

## 2018-06-10 DIAGNOSIS — E1165 Type 2 diabetes mellitus with hyperglycemia: Secondary | ICD-10-CM

## 2018-06-10 DIAGNOSIS — H8110 Benign paroxysmal vertigo, unspecified ear: Secondary | ICD-10-CM | POA: Insufficient documentation

## 2018-06-10 LAB — POCT CBG (FASTING - GLUCOSE)-MANUAL ENTRY: Glucose Fasting, POC: 352 mg/dL — AB (ref 70–99)

## 2018-06-10 MED ORDER — NYSTATIN 100000 UNIT/GM EX POWD: Freq: Two times a day (BID) | CUTANEOUS | 0 refills | Status: DC

## 2018-06-10 MED ORDER — GLUCOSE BLOOD VI STRP: ORAL_STRIP | 1 refills | Status: DC

## 2018-06-10 MED ORDER — FREESTYLE SYSTEM KIT: 1.0000 | PACK | Freq: Three times a day (TID) | 0 refills | Status: DC

## 2018-06-10 MED ORDER — FREESTYLE LANCETS MISC: 1 refills | Status: DC

## 2018-06-10 NOTE — Progress Notes (Signed)
Subjective:     Patient ID: Isabel Berger, female   DOB: 11-14-1965, 52 y.o.   MRN: [id number]  Diabetes  She presents for her follow-up diabetic visit. She has type 2 diabetes mellitus. Onset time: 15. Her disease course has been worsening (Was initially on Metformin and now on Insulin. Takes Metformin 2000 mg daily). Hypoglycemia symptoms include dizziness and headaches. Pertinent negatives for hypoglycemia include no confusion, nervousness/anxiousness, seizures, speech difficulty, sweats or tremors. Associated symptoms include blurred vision. (Nausea, occasional dizziness. Headache since her recent hospital visit.) Pertinent negatives for hypoglycemia complications include no blackouts, no nocturnal hypoglycemia and no required glucagon injection. Symptoms are stable. There are no diabetic complications. Risk factors for coronary artery disease include diabetes mellitus, hypertension and family history. Current diabetic treatment includes insulin injections and oral agent (monotherapy) (Metformin 2000 mg QD. Lantus 24 QHS. Compliant with her med). She is compliant with treatment all of the time. Her weight is decreasing steadily. When asked about meal planning, she reported none. She has not had a previous visit with a dietitian. She participates in exercise daily. Home blood sugar record trend: Last checked was two days ago at work and her CBG was in the 500. She is out of her test strip, hence had not been checking lately. An ACE inhibitor/angiotensin II receptor blocker is not being taken.  Headache   This is a new problem. Episode onset: two days ago. The problem occurs intermittently. The problem has been waxing and waning. Pain location: around her eyes B/L. The pain quality is not similar to prior headaches. The quality of the pain is described as aching. The pain is at a severity of 8/10 (Started since she went to the ED and was given IV fluid). Associated symptoms include blurred vision,  dizziness, eye pain, eye watering and photophobia. Pertinent negatives include no coughing, eye redness, fever, hearing loss, phonophobia or seizures. The symptoms are aggravated by bright light. Treatments tried: BC powder and rest. The treatment provided significant relief. There is no history of hypertension, migraine headaches or recent head traumas.  Dizziness  This is a new problem. The current episode started 1 to 4 weeks ago. The problem occurs intermittently. The problem has been waxing and waning. Associated symptoms include headaches, a rash and vertigo. Pertinent negatives include no coughing or fever. Associated symptoms comments: Occurs with position change. The symptoms are aggravated by bending. She has tried nothing for the symptoms.  Skin redness: Red skin rash on her breast creases B/L and her groin area. On going for weeks. She sweats a lot in these areas. It burns and hurts sometimes especially when she sweats. She tries to keep the area clean and dry.   Current Outpatient Medications on File Prior to Visit  Medication Sig Dispense Refill  . dicyclomine (BENTYL) 20 MG tablet Take 1 tablet (20 mg total) by mouth 2 (two) times daily. (Patient not taking: Reported on 06/02/2018) 20 tablet 0  . gabapentin (NEURONTIN) 100 MG capsule Take 1 capsule (100 mg total) by mouth 3 (three) times daily. 90 capsule 2  . Insulin Glargine (LANTUS) 100 UNIT/ML Solostar Pen Inject 10-100 Units into the skin daily. Increase by 2 units for blood glucose above 150. 2 pen 0  . metFORMIN (GLUCOPHAGE) 500 MG tablet Take 2 tablets (1,000 mg total) by mouth 2 (two) times daily with a meal. 180 tablet 3  . naproxen (NAPROSYN) 500 MG tablet Take 1 tablet (500 mg total) by mouth 2 (two) times daily.  30 tablet 0  . ondansetron (ZOFRAN) 4 MG tablet Take 1 tablet (4 mg total) by mouth every 8 (eight) hours as needed for nausea or vomiting. (Patient not taking: Reported on 06/02/2018) 4 tablet 0   No current  facility-administered medications on file prior to visit.    Past Medical History:  Diagnosis Date  . Depression   . Diabetes mellitus   . Hypertension    Vitals:   06/10/18 0904  BP: 118/80  Pulse: 69  Temp: 98.1 F (36.7 C)  TempSrc: Oral  SpO2: 99%  Weight: 197 lb (89.4 kg)     Review of Systems  Constitutional: Negative for fever.  HENT: Negative for hearing loss.   Eyes: Positive for blurred vision, photophobia and pain. Negative for redness.  Respiratory: Negative for cough.   Cardiovascular: Negative.   Gastrointestinal: Negative.   Skin: Positive for rash.  Neurological: Positive for dizziness, vertigo and headaches. Negative for tremors, seizures and speech difficulty.  Psychiatric/Behavioral: Negative for confusion. The patient is not nervous/anxious.   All other systems reviewed and are negative.      Objective:   Physical Exam  Constitutional:  Non-toxic appearance. No distress.  Eyes: Pupils are equal, round, and reactive to light. Conjunctivae and EOM are normal.  Fundoscopic exam:      The right eye shows no hemorrhage and no papilledema.       The left eye shows no hemorrhage and no papilledema.  Cardiovascular: Normal rate, regular rhythm and normal heart sounds.  No murmur heard. Pulmonary/Chest: Effort normal and breath sounds normal. No respiratory distress. She has no wheezes.  Abdominal: Soft. Bowel sounds are normal. She exhibits no distension and no mass.  Musculoskeletal: Normal range of motion. She exhibits no edema.  Neurological: No cranial nerve deficit or sensory deficit. Coordination and gait normal.  Felt dizzy when I made her lie down flat and move her head then she felt better  Skin: Rash noted.  Erythematous macerated rash under her breast fold B/L  Nursing note and vitals reviewed.      Assessment:     DM2 Headache BPPV Candida intertrigo    Plan:     Check problem list.

## 2018-06-10 NOTE — Assessment & Plan Note (Signed)
Epley maneuver discussed. Will hold off on meclizine for now. ED visit if it worsens. F/U soon with PCP for reassessment.

## 2018-06-10 NOTE — Patient Instructions (Addendum)
It was nice seeing you today. Please increase your insulin to 30 units daily. Continue Metformin at 1000 mg BID. Check your glucose daily 3 times. Hold your insulin and call your doctor if it is less than 100. Keep glucose around 130 - 180 range. Please contact your ophthalmologist today for an appointment. Go to the ED if your vision problem or headache is worsening. Use tylenol as needed for headache.      How to Perform the Epley Maneuver The Epley maneuver is an exercise that relieves symptoms of vertigo. Vertigo is the feeling that you or your surroundings are moving when they are not. When you feel vertigo, you may feel like the room is spinning and have trouble walking. Dizziness is a little different than vertigo. When you are dizzy, you may feel unsteady or light-headed. You can do this maneuver at home whenever you have symptoms of vertigo. You can do it up to 3 times a day until your symptoms go away. Even though the Epley maneuver may relieve your vertigo for a few weeks, it is possible that your symptoms will return. This maneuver relieves vertigo, but it does not relieve dizziness. What are the risks? If it is done correctly, the Epley maneuver is considered safe. Sometimes it can lead to dizziness or nausea that goes away after a short time. If you develop other symptoms, such as changes in vision, weakness, or numbness, stop doing the maneuver and call your health care provider. How to perform the Epley maneuver 1. Sit on the edge of a bed or table with your back straight and your legs extended or hanging over the edge of the bed or table. 2. Turn your head halfway toward the affected ear or side. 3. Lie backward quickly with your head turned until you are lying flat on your back. You may want to position a pillow under your shoulders. 4. Hold this position for 30 seconds. You may experience an attack of vertigo. This is normal. 5. Turn your head to the opposite direction until  your unaffected ear is facing the floor. 6. Hold this position for 30 seconds. You may experience an attack of vertigo. This is normal. Hold this position until the vertigo stops. 7. Turn your whole body to the same side as your head. Hold for another 30 seconds. 8. Sit back up. You can repeat this exercise up to 3 times a day. Follow these instructions at home:  After doing the Epley maneuver, you can return to your normal activities.  Ask your health care provider if there is anything you should do at home to prevent vertigo. He or she may recommend that you: ? Keep your head raised (elevated) with two or more pillows while you sleep. ? Do not sleep on the side of your affected ear. ? Get up slowly from bed. ? Avoid sudden movements during the day. ? Avoid extreme head movement, like looking up or bending over. Contact a health care provider if:  Your vertigo gets worse.  You have other symptoms, including: ? Nausea. ? Vomiting. ? Headache. Get help right away if:  You have vision changes.  You have a severe or worsening headache or neck pain.  You cannot stop vomiting.  You have new numbness or weakness in any part of your body. Summary  Vertigo is the feeling that you or your surroundings are moving when they are not.  The Epley maneuver is an exercise that relieves symptoms of vertigo.  If  the Epley maneuver is done correctly, it is considered safe. You can do it up to 3 times a day. This information is not intended to replace advice given to you by your health care provider. Make sure you discuss any questions you have with your health care provider. Document Released: 08/15/2013 Document Revised: 06/30/2016 Document Reviewed: 06/30/2016 Elsevier Interactive Patient Education  2017 [employer].

## 2018-06-10 NOTE — Assessment & Plan Note (Signed)
??   Migraine vs related to her vision. No neurologic deficit. She has had blurry vision for months per documentation and she has been using unprescribed meds. Tylenol recommended as needed for headache. I recommended ophthalmology eval. She stated that she has an ophthalmologist and will call their office today to schedule an appointment. Return precaution discussed.

## 2018-06-10 NOTE — Assessment & Plan Note (Signed)
Nystatin cream prescribed. Keep area clean and dry.

## 2018-06-10 NOTE — Addendum Note (Signed)
Addended by: Dorna Bloom on: 06/10/2018 11:53 AM   Modules accepted: Orders

## 2018-06-10 NOTE — Assessment & Plan Note (Signed)
Poorly controlled despite medication compliance. Her fasting CBG this morning was 350+ She can safely go upp to Lantus 30 units daily with her Metformin. Check CBG TID and keep glucose in the 130-180 range. She is instructed to hold her insulin if CBG is less than 100 or having symptoms and call her PCP right a way. F/U in 1-2 weeks for reassessment. ED visit if feeling sick.

## 2018-06-11 LAB — LIPID PANEL
CHOL/HDL RATIO: 4.8 ratio — AB (ref 0.0–4.4)
CHOLESTEROL TOTAL: 203 mg/dL — AB (ref 100–199)
HDL: 42 mg/dL (ref >39)
LDL CALC: 138 mg/dL — AB (ref 0–99)
TRIGLYCERIDES: 114 mg/dL (ref 0–149)
VLDL Cholesterol Cal: 23 mg/dL (ref 5–40)

## 2018-06-13 ENCOUNTER — Telehealth: Payer: Self-pay | Admitting: Family Medicine

## 2018-06-13 NOTE — Telephone Encounter (Signed)
HIPAA compliant call back message left.   Note: LDL and total cholesterol elevated.  Since she has DM2 she will benefit from Moderate intensity Statin.  She has a PCP f/u appointment today. I will forward note to her PCP to discuss with her during her visit.

## 2018-06-13 NOTE — Telephone Encounter (Signed)
Pt is returning the phone call concerning her results.

## 2018-06-13 NOTE — Telephone Encounter (Signed)
Patient's appointment with her PCP is actually 07/14/18 not today.  Message left on her phone to call back.  PCP will also f/u with test result.

## 2018-06-14 ENCOUNTER — Other Ambulatory Visit: Payer: Self-pay | Admitting: Family Medicine

## 2018-06-14 MED ORDER — NYSTATIN-TRIAMCINOLONE 100000-0.1 UNIT/GM-% EX OINT: 1.0000 "application " | TOPICAL_OINTMENT | Freq: Two times a day (BID) | CUTANEOUS | 0 refills | Status: DC

## 2018-06-14 MED ORDER — ATORVASTATIN CALCIUM 40 MG PO TABS: 40.0000 mg | ORAL_TABLET | Freq: Every day | ORAL | 0 refills | Status: DC

## 2018-06-14 NOTE — Telephone Encounter (Signed)
I escribed Lipitor for her.  Also let her know she can switch to Triamcinolone-Nysttatin cream combined. I have escribed that as well for her Rash. If no improvement, have her see her PCP soon. Thanks.

## 2018-06-14 NOTE — Telephone Encounter (Signed)
Pt calls back.    1. She was informed about the high cholesterol and is agreeable to starting a statin. Will forward to MD to send in.  2. The nystatin powder burns (she has tried it x2) and is making the redness worse.  Will forward to MD to advise. Walker Paddack, Salome Spotted, CMA

## 2018-06-14 NOTE — Telephone Encounter (Signed)
Pt informed. Isabel Berger, CMA  

## 2018-06-15 ENCOUNTER — Other Ambulatory Visit: Payer: Self-pay | Admitting: Family Medicine

## 2018-06-15 DIAGNOSIS — E1142 Type 2 diabetes mellitus with diabetic polyneuropathy: Secondary | ICD-10-CM

## 2018-06-16 LAB — FECAL OCCULT BLOOD, IMMUNOCHEMICAL: Fecal Occult Bld: NEGATIVE

## 2018-06-20 ENCOUNTER — Encounter (HOSPITAL_COMMUNITY): Payer: Self-pay | Admitting: *Deleted

## 2018-06-20 ENCOUNTER — Telehealth: Payer: Self-pay | Admitting: Family Medicine

## 2018-06-20 NOTE — Telephone Encounter (Signed)
Pt did not answer, called to make 2 week f/u appt

## 2018-06-20 NOTE — Progress Notes (Signed)
Letter mailed to patient with negative Fit Test results.  
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
## 2018-06-22 ENCOUNTER — Ambulatory Visit (INDEPENDENT_AMBULATORY_CARE_PROVIDER_SITE_OTHER): Payer: Self-pay | Admitting: Family Medicine

## 2018-06-22 ENCOUNTER — Other Ambulatory Visit: Payer: Self-pay

## 2018-06-22 ENCOUNTER — Encounter (HOSPITAL_COMMUNITY): Payer: Self-pay | Admitting: *Deleted

## 2018-06-22 VITALS — BP 138/90 | Temp 98.1°F | Ht 67.0 in | Wt 204.0 lb

## 2018-06-22 DIAGNOSIS — L739 Follicular disorder, unspecified: Secondary | ICD-10-CM | POA: Insufficient documentation

## 2018-06-22 DIAGNOSIS — R519 Headache, unspecified: Secondary | ICD-10-CM

## 2018-06-22 DIAGNOSIS — R51 Headache: Secondary | ICD-10-CM

## 2018-06-22 MED ORDER — CYCLOBENZAPRINE HCL 5 MG PO TABS: 5.0000 mg | ORAL_TABLET | Freq: Three times a day (TID) | ORAL | 0 refills | Status: DC | PRN

## 2018-06-22 NOTE — Assessment & Plan Note (Signed)
Not quite ready to be lanced. Encouraged continued warm compresses. Discussed better diabetic control would help to prevent further infection. Return precautions reviewed.

## 2018-06-22 NOTE — Assessment & Plan Note (Addendum)
Likely tension vs migraine. More likely tension related given stressful situation and duration of symptoms. No neurological deficits. Encouraged use of tylenol with ibuprofen and use of heating pad for upper back and neck to relieve muscle tension. Will prescribe short course of flexeril to aid in muscle relaxation.  Return precautions reviewed.

## 2018-06-22 NOTE — Patient Instructions (Signed)
It was great to see you!  Our plans for today:  - Take tylenol and ibuprofen for your headache. If that is not helping, you can take the muscle relaxer. A heating pad may also be helpful. - For the abscess on your head, use warm wet compresses to bring it to a head. If you develop fever or your headache worsens, come back to be seen.  Take care and seek immediate care sooner if you develop any concerns.   Dr. [facility]

## 2018-06-28 ENCOUNTER — Other Ambulatory Visit (HOSPITAL_COMMUNITY): Payer: Self-pay | Admitting: *Deleted

## 2018-06-28 DIAGNOSIS — Z0000 Encounter for general adult medical examination without abnormal findings: Secondary | ICD-10-CM

## 2018-06-29 ENCOUNTER — Other Ambulatory Visit: Payer: No Typology Code available for payment source

## 2018-06-30 ENCOUNTER — Ambulatory Visit (INDEPENDENT_AMBULATORY_CARE_PROVIDER_SITE_OTHER): Payer: Self-pay | Admitting: Family Medicine

## 2018-06-30 ENCOUNTER — Other Ambulatory Visit: Payer: Self-pay

## 2018-06-30 VITALS — BP 118/72 | Temp 98.2°F | Wt 200.0 lb

## 2018-06-30 DIAGNOSIS — L700 Acne vulgaris: Secondary | ICD-10-CM | POA: Insufficient documentation

## 2018-06-30 MED ORDER — DOXYCYCLINE HYCLATE 100 MG PO TABS: 100.0000 mg | ORAL_TABLET | Freq: Two times a day (BID) | ORAL | 0 refills | Status: AC

## 2018-06-30 NOTE — Patient Instructions (Signed)
Acne  Acne is a skin problem that causes small, red bumps (pimples). Acne happens when the tiny holes in your skin (pores) get blocked. Your pores may become red, sore, and swollen. They may also become infected. Acne is a common skin problem. It is especially common in teenagers. Acne usually goes away over time.  Follow these instructions at home:  Good skin care is the most important thing you can do to treat your acne. Take care of your skin as told by your doctor. You may be told to do these things:  · Wash your skin gently at least two times each day. You should also wash your skin:  ? After you exercise.  ? Before you go to bed.  · Use mild soap.  · Use a water-based skin moisturizer after you wash your skin.  · Use a sunscreen or sunblock with SPF 30 or greater. This is very important if you are using acne medicines.  · Choose cosmetics that will not plug your oil glands (are noncomedogenic).    Medicines  · Take over-the-counter and prescription medicines only as told by your doctor.  · If you were prescribed an antibiotic medicine, apply or take it as told by your doctor. Do not stop using the antibiotic even if your acne improves.  General instructions  · Keep your hair clean and off of your face. Shampoo your hair regularly. If you have oily hair, you may need to wash it every day.  · Avoid leaning your chin or forehead on your hands.  · Avoid wearing tight headbands or hats.  · Avoid picking or squeezing your pimples. That can make your acne worse and cause scarring.  · Keep all follow-up visits as told by your doctor. This is important.  · Shave gently. Only shave when it is necessary.  · Keep a food journal. This can help you to see if any foods are linked with your acne.  Contact a doctor if:  · Your acne is not better after eight weeks.  · Your acne gets worse.  · You have a large area of skin that is red or tender.  · You think that you are having side effects from any acne medicine.  This  information is not intended to replace advice given to you by your health care provider. Make sure you discuss any questions you have with your health care provider.  Document Released: 07/30/2011 Document Revised: 01/16/2016 Document Reviewed: 10/17/2014  Elsevier Interactive Patient Education © 2018 Else[employer] Inc[employer]

## 2018-06-30 NOTE — Progress Notes (Signed)
   Subjective:    Patient ID: Isabel Berger, female    DOB: 06-20-1966, 52 y.o.   MRN: [id number]  Rash  This is a new problem. The current episode started yesterday. The problem has been rapidly worsening since onset. The affected locations include the back. The rash is characterized by redness. She was exposed to nothing. Pertinent negatives include no anorexia, cough, fatigue, fever or shortness of breath. Past treatments include nothing. The treatment provided no relief.      Review of Systems  Constitutional: Negative for fatigue and fever.  Respiratory: Negative for cough and shortness of breath.   Gastrointestinal: Negative for anorexia.  Skin: Positive for rash.  All other systems reviewed and are negative.      Objective:   Physical Exam  Constitutional: She appears well-developed. No distress.  Skin: Skin is dry. Rash noted. She is not diaphoretic.      Vitals:   06/30/18 0913  BP: 118/72  Temp: 98.2 F (36.8 C)  TempSrc: Oral  Weight: 200 lb (90.7 kg)          Assessment & Plan:  Acne comedone New onset of closed comedones on the back.  No signs or symptoms of cellulitis.  No history of this.  Given the location and discomfort patient reason, will trial 1 week of doxycycline. Return precautions given.  Follow-up PRN.

## 2018-06-30 NOTE — Addendum Note (Signed)
Addended by: Hazeline Junker on: 06/30/2018 03:43 PM   Modules accepted: Orders

## 2018-06-30 NOTE — Progress Notes (Deleted)
    Subjective:  Isabel Berger is a 52 y.o. female who presents to the [facility] today with a chief complaint of ***.   HPI:   ***HIST  Objective:  Physical Exam: There were no vitals taken for this visit.  Physical Exam   No results found for this or any previous visit (from the past 72 hour(s)).   Assessment/Plan:  No problem-specific Assessment & Plan notes found for this encounter.   Lab Orders  No laboratory test(s) ordered today    No orders of the defined types were placed in this encounter.     Marny Lowenstein, MD, MS FAMILY MEDICINE RESIDENT - PGY2 06/30/2018 9:10 AM

## 2018-06-30 NOTE — Assessment & Plan Note (Signed)
New onset of closed comedones on the back.  No signs or symptoms of cellulitis.  No history of this.  Given the location and discomfort patient reason, will trial 1 week of doxycycline. Return precautions given.  Follow-up PRN.

## 2018-07-01 ENCOUNTER — Ambulatory Visit: Payer: No Typology Code available for payment source

## 2018-07-01 ENCOUNTER — Inpatient Hospital Stay: Payer: No Typology Code available for payment source

## 2018-07-14 ENCOUNTER — Encounter: Payer: Self-pay | Admitting: Family Medicine

## 2018-07-14 ENCOUNTER — Ambulatory Visit (INDEPENDENT_AMBULATORY_CARE_PROVIDER_SITE_OTHER): Payer: Self-pay | Admitting: Family Medicine

## 2018-07-14 VITALS — BP 100/70 | HR 90 | Temp 97.8°F | Wt 200.0 lb

## 2018-07-14 DIAGNOSIS — E119 Type 2 diabetes mellitus without complications: Secondary | ICD-10-CM

## 2018-07-14 LAB — POCT GLYCOSYLATED HEMOGLOBIN (HGB A1C): HbA1c POC (<> result, manual entry): >15 % (ref 4.0–5.6)

## 2018-07-14 MED ORDER — INSULIN GLARGINE 100 UNIT/ML SOLOSTAR PEN: 10.0000 [IU] | PEN_INJECTOR | Freq: Every day | SUBCUTANEOUS | 2 refills | Status: DC

## 2018-07-14 MED ORDER — FREESTYLE LANCETS MISC: 1 refills | Status: AC

## 2018-07-14 MED ORDER — INSULIN GLARGINE 100 UNIT/ML SOLOSTAR PEN: 10.0000 [IU] | PEN_INJECTOR | Freq: Every day | SUBCUTANEOUS | 0 refills | Status: DC

## 2018-07-14 NOTE — Patient Instructions (Signed)
It was nice seeing you again today.  You were seen in clinic for nausea and headache which is most likely due to your elevated blood sugars.  We rechecked your A1c today which was greater than 15 and indicates uncontrolled diabetes.  As we discussed, I have refilled your insulin and sent this to your pharmacy.  I have increased your Lantus to 40 units daily in the morning.  Please continue to check your blood sugars at home following meals and log these on a sheet.  You may follow-up in 2 weeks to review what your blood sugars have been measuring in case further adjustments to your insulin need to be made.  Please call clinic if you have any questions.  Lovenia Kim MD

## 2018-07-14 NOTE — Assessment & Plan Note (Addendum)
Remains poorly controlled, A1c today >15, up from 14.5 last month.  Patient is on metformin 1000 mg BID and Lantus 30 units daily.  She reports good compliance however has not been taking her Lantus over the last 1 week as she has run out.  Having symptoms of nausea without vomiting, no abdominal pain.  Do not suspect she is in DKA at this time.  However if discussed the symptoms of this with her including ED precautions.  She expressed good understanding of this. -Refilled Lantus, increase to 40 units daily in a.m. -Advised her to continue checking CBGs following meals and bring a log of these to her next visit -Follow-up in 2 weeks to assess if her insulin needs further adjustment

## 2018-07-14 NOTE — Progress Notes (Signed)
   Subjective:   Patient ID: Isabel Berger    DOB: Feb 18, 1966, 52 y.o. female   MRN: [id number]  CC: elevated blood sugars, f/u diabetes  HPI: Isabel Berger is a 52 y.o. female who presents to clinic today for the following issue.  T2DM Has been feeling nauseous since waking up this morning. Has not vomited and denies abdominal pain. Also complaining of bad headache.  Is on Metformin 1000 mg BID and Lantus 30U daily in the AM.  Reports good compliance however has not been able to take her insulin for about a week as it was not filled at her last visit.  She is checking sugars at home.  CBGs range from 370-600.  She has a nurse who comes to her home to help daily.     Diet- salads, eats grilled or boiled chicken, lots of vegetables, drinks plenty of water Exercise-  Works at an apartment complex, walks around a lot during the day 7 days a week  ROS: No fevers, chills.  No abdominal pain, shortness of breath.    Social: pt is a never smoker.  Medications reviewed. Objective:   BP 100/70   Pulse 90   Temp 97.8 F (36.6 C) (Oral)   Wt 200 lb (90.7 kg)   SpO2 97%   BMI 31.32 kg/m  Vitals and nursing note reviewed.  General: 52 year old female, NAD HEENT: NCAT, EOMI, PERRL, MMM Neck: supple CV: RRR no MRG  Lungs: CTAB, normal effort  Abdomen: soft, NTND, +bs  Skin: warm, dry, no rash, normal skin turgor Extremities: warm and well perfused Neuro: alert, oriented x3, no focal deficits   Assessment & Plan:   T2DM (type 2 diabetes mellitus) ([place]) Remains poorly controlled, A1c today >15, up from 14.5 last month.  Patient is on metformin 1000 mg BID and Lantus 30 units daily.  She reports good compliance however has not been taking her Lantus over the last 1 week as she has run out.  Having symptoms of nausea without vomiting, no abdominal pain.  Do not suspect she is in DKA at this time.  However if discussed the symptoms of this with her including ED precautions.  She  expressed good understanding of this. -Refilled Lantus, increase to 40 units daily in a.m. -Advised her to continue checking CBGs following meals and bring a log of these to her next visit -Follow-up in 2 weeks to assess if her insulin needs further adjustment  Orders Placed This Encounter  Procedures  . HgB A1c   Follow up: 2 weeks  Lovenia Kim, MD [place]

## 2018-07-15 ENCOUNTER — Other Ambulatory Visit: Payer: Self-pay | Admitting: Family Medicine

## 2018-07-15 ENCOUNTER — Telehealth: Payer: Self-pay

## 2018-07-15 MED ORDER — FLUCONAZOLE 150 MG PO TABS: 150.0000 mg | ORAL_TABLET | Freq: Every day | ORAL | 0 refills | Status: DC

## 2018-07-15 NOTE — Telephone Encounter (Signed)
Patient calling stating she has been on Doxycycline and now has a yeast infection and is raw. Is asking for yeast medication to be sent to pharmacy.   Danley Danker, RN [facility] RN)

## 2018-07-15 NOTE — Telephone Encounter (Signed)
Have prescribed this for her, please inform pt

## 2018-07-18 NOTE — Telephone Encounter (Signed)
LM for patient that script she requested has been sent to the pharmacy.  Jazmin Hartsell,CMA

## 2018-08-02 ENCOUNTER — Encounter (HOSPITAL_COMMUNITY): Payer: Self-pay | Admitting: *Deleted

## 2018-08-02 ENCOUNTER — Emergency Department (HOSPITAL_COMMUNITY)
Admission: EM | Admit: 2018-08-02 | Discharge: 2018-08-02 | Disposition: A | Discharge status: Home / Self Care | Payer: No Typology Code available for payment source | Attending: Emergency Medicine | Admitting: Emergency Medicine

## 2018-08-02 DIAGNOSIS — I10 Essential (primary) hypertension: Secondary | ICD-10-CM | POA: Insufficient documentation

## 2018-08-02 DIAGNOSIS — R739 Hyperglycemia, unspecified: Secondary | ICD-10-CM

## 2018-08-02 DIAGNOSIS — Z79899 Other long term (current) drug therapy: Secondary | ICD-10-CM | POA: Insufficient documentation

## 2018-08-02 DIAGNOSIS — E1165 Type 2 diabetes mellitus with hyperglycemia: Secondary | ICD-10-CM | POA: Insufficient documentation

## 2018-08-02 LAB — BASIC METABOLIC PANEL
Anion gap: 10 (ref 5–15)
BUN: 17 mg/dL (ref 6–20)
CO2: 22 mmol/L (ref 22–32)
Calcium: 9.1 mg/dL (ref 8.9–10.3)
Chloride: 98 mmol/L (ref 98–111)
Creatinine, Ser: 0.77 mg/dL (ref 0.44–1.00)
GFR calc Af Amer: >60 mL/min (ref >60)
GFR calc non Af Amer: >60 mL/min (ref >60)
GLUCOSE: 550 mg/dL — AB (ref 70–99)
Potassium: 3.7 mmol/L (ref 3.5–5.1)
Sodium: 130 mmol/L — ABNORMAL LOW (ref 135–145)

## 2018-08-02 LAB — CBC
HCT: 38.9 % (ref 36.0–46.0)
Hemoglobin: 12.8 g/dL (ref 12.0–15.0)
MCH: 30.2 pg (ref 26.0–34.0)
MCHC: 32.9 g/dL (ref 30.0–36.0)
MCV: 91.7 fL (ref 80.0–100.0)
Platelets: 367 10*3/uL (ref 150–400)
RBC: 4.24 MIL/uL (ref 3.87–5.11)
RDW: 12 % (ref 11.5–15.5)
WBC: 9.3 10*3/uL (ref 4.0–10.5)
nRBC: 0 % (ref 0.0–0.2)

## 2018-08-02 LAB — URINALYSIS, ROUTINE W REFLEX MICROSCOPIC
Bilirubin Urine: NEGATIVE
Glucose, UA: >=500 mg/dL — AB
Hgb urine dipstick: NEGATIVE
Ketones, ur: 5 mg/dL — AB
Nitrite: NEGATIVE
Protein, ur: NEGATIVE mg/dL
Specific Gravity, Urine: 1.031 — ABNORMAL HIGH (ref 1.005–1.030)
pH: 5 (ref 5.0–8.0)

## 2018-08-02 LAB — CBG MONITORING, ED
Glucose-Capillary: 534 mg/dL (ref 70–99)
Glucose-Capillary: 287 mg/dL — ABNORMAL HIGH (ref 70–99)

## 2018-08-02 LAB — I-STAT BETA HCG BLOOD, ED (MC, WL, AP ONLY): I-stat hCG, quantitative: <5 m[IU]/mL (ref <5)

## 2018-08-02 MED ORDER — INSULIN ASPART 100 UNIT/ML ~~LOC~~ SOLN
10.0000 [IU] | Freq: Once | SUBCUTANEOUS | Status: AC
  Administered 2018-08-02: 10 [IU] via INTRAVENOUS
  Filled 2018-08-02: qty 1

## 2018-08-02 MED ORDER — INSULIN GLARGINE 100 UNIT/ML ~~LOC~~ SOLN: 80.0000 [IU] | Freq: Every day | SUBCUTANEOUS | 11 refills | Status: DC

## 2018-08-02 MED ORDER — SODIUM CHLORIDE 0.9 % IV BOLUS
1000.0000 mL | Freq: Once | INTRAVENOUS | Status: AC
  Administered 2018-08-02: 1000 mL via INTRAVENOUS

## 2018-08-02 NOTE — ED Notes (Signed)
Save blue tube in main lab °
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
## 2018-08-02 NOTE — ED Triage Notes (Signed)
Pt reports her blood sugar was 577. Pt took 1000mg  metformin this morning. Pt reports feeling dizzy. Pt states she is also having trouble paying for her Lantuss.

## 2018-08-02 NOTE — ED Provider Notes (Signed)
4:25 PM Assumed care from Dr. Stark Jock, please see their note for full history, physical and decision making until this point. In brief this is a 52 y.o. year old female who presented to the ED tonight with Hyperglycemia     Patient with longstanding diabetes but recently started on insulin presents to the emergency department today secondary to hyperglycemia secondary to her Lantus being too expensive.  Plan to recheck sugars to make sure that improving and she does not have DKA or other serious illness at this time. On reevaluation patient feels well.  Glucose of 287.  Care management consulted but they will need to call her at home. As she only has a couple doses left I suggested that she take 1 dose tonight and the next dose Thursday morning if he is not able to get her prescription refilled.   Stable for discharge.   Discharge instructions, including strict return precautions for new or worsening symptoms, given. Patient and/or family verbalized understanding and agreement with the plan as described.   Labs, studies and imaging reviewed by myself and considered in medical decision making if ordered. Imaging interpreted by radiology.  Labs Reviewed  BASIC METABOLIC PANEL - Abnormal; Notable for the following components:      Result Value   Sodium 130 (*)    Glucose, Bld 550 (*)    All other components within normal limits  URINALYSIS, ROUTINE W REFLEX MICROSCOPIC - Abnormal; Notable for the following components:   Color, Urine STRAW (*)    Specific Gravity, Urine 1.031 (*)    Glucose, UA >=500 (*)    Ketones, ur 5 (*)    Leukocytes, UA SMALL (*)    Bacteria, UA RARE (*)    All other components within normal limits  CBG MONITORING, ED - Abnormal; Notable for the following components:   Glucose-Capillary 534 (*)    All other components within normal limits  CBG MONITORING, ED - Abnormal; Notable for the following components:   Glucose-Capillary 287 (*)    All other components within  normal limits  CBC  I-STAT BETA HCG BLOOD, ED ([facility], [facility], [facility] ONLY)    No orders to display    No follow-ups on file.    Merrily Pew, MD 08/02/18 [phone]

## 2018-08-02 NOTE — Discharge Instructions (Signed)
If you are not able to get more Lantus then please take your dose tonight and then again on Thursday morning in anticipation of your doctor's appointment on Friday.

## 2018-08-03 ENCOUNTER — Telehealth: Payer: Self-pay | Admitting: *Deleted

## 2018-08-03 NOTE — Telephone Encounter (Signed)
[facility] consulted regarding pt not able to afford Rx. EDCM left voicemail for pt to return call to me.  Will advise to take Rx to [facility] for best pricing.

## 2018-08-04 ENCOUNTER — Telehealth: Payer: Self-pay

## 2018-08-04 NOTE — Telephone Encounter (Signed)
Pt LVM on nurse line stating, "I am having the same problem I was having a few weeks ago." Pt stated, "I am having to wear a pad to keep from getting blood on my clothes, im so raw from scratching." I called patient back to see what's going on. No answer, and unable to leave VM.

## 2018-08-05 ENCOUNTER — Telehealth: Payer: Self-pay | Admitting: *Deleted

## 2018-08-05 NOTE — Telephone Encounter (Signed)
Pt called EDCM regarding Rx too expensive.  Explained the [place]- Medication Assistance program to pt. Pt understands that the [facility] program is only a one time in one year from the date of discharge to next year. Pt also understands that there is a $3.00 co pay for each prescription.  EDCM enrolled pt in [facility] program and faxed letter to [place] as requested by pt.  No further EDCM needs identified at this time.  Hawke Villalpando J. Clydene Laming, [place], [place], [place]

## 2018-08-25 ENCOUNTER — Other Ambulatory Visit: Payer: Self-pay | Admitting: *Deleted

## 2018-08-25 ENCOUNTER — Other Ambulatory Visit: Payer: Self-pay | Admitting: Family Medicine

## 2018-08-25 MED ORDER — FLUCONAZOLE 150 MG PO TABS: ORAL_TABLET | ORAL | 0 refills | Status: DC

## 2018-08-25 NOTE — Telephone Encounter (Signed)
Spoke to patient and advised her of RX called into pharmacy and instructions.  Patient verbalizes understanding and was very Patent attorney.  Ozella Almond, [place]

## 2018-08-25 NOTE — Telephone Encounter (Signed)
I have sent in an rx for diflucan for her with one additional tab to take in 2-3 days if no improvement. For a barrier cream she may use an OTC zinc oxide cream.   Please inform patient, thanks

## 2018-08-25 NOTE — Telephone Encounter (Signed)
Patient calling to request refill for diflucan and would also like a cream to help with healing.  States she is so raw on her "bottom" and has been wearing a pad to help keep the area dry.  Was given diflucan #2 tabs and it helped clear it up in the past.  Has lost weight and has excess skin on her bottom that is getting "irritated and raw".  CBGs were in 500-600 range, but now in 300-400 range.  Will route request to PCP for diflucan and also barrier cream request.  Burna Forts, MSN, RN-BC

## 2018-09-12 ENCOUNTER — Other Ambulatory Visit: Payer: Self-pay

## 2018-09-12 ENCOUNTER — Ambulatory Visit (INDEPENDENT_AMBULATORY_CARE_PROVIDER_SITE_OTHER): Payer: Self-pay | Admitting: Family Medicine

## 2018-09-12 VITALS — BP 122/78 | HR 110 | Temp 97.7°F | Ht 67.0 in | Wt 199.8 lb

## 2018-09-12 DIAGNOSIS — L304 Erythema intertrigo: Secondary | ICD-10-CM

## 2018-09-12 MED ORDER — NYSTATIN 100000 UNIT/GM EX POWD: Freq: Four times a day (QID) | CUTANEOUS | 0 refills | Status: DC

## 2018-09-12 MED ORDER — FLUCONAZOLE 150 MG PO TABS: 150.0000 mg | ORAL_TABLET | ORAL | 0 refills | Status: DC

## 2018-09-12 NOTE — Progress Notes (Signed)
   CC: skin splitting  HPI  Skin changes with "splitting:" Losing weight with improved DM control per her report (though chart suggests poor control). She has been trying to make healthier food choices and exercise - has lost about 50 lbs. She has some extra skin around her abdomen. She has had about a month or more of skin changes, tried oral med for yeast for a few days and had some improvement in the pain and the skin changes. Now the changes under her breasts are resolved, but she has continued burning 10/10 pain of her inguinal creases and vulva and perirectal area. Has been applying A+D ointment, but this burns too. Tried some nystatin powder briefly at home.   ROS: Denies CP, SOB, abdominal pain, dysuria, changes in BMs.   CC, SH/smoking status, and VS noted  Objective: BP 122/78   Pulse (!) 110   Temp 97.7 F (36.5 C) (Oral)   Ht 5\' 7"  (1.702 m)   Wt 199 lb 12.8 oz (90.6 kg)   SpO2 98%   BMI 31.29 kg/m  Gen: NAD, alert, cooperative, and pleasant. HEENT: NCAT, EOMI, PERRL CV: RRR, no murmur Resp: CTAB, no wheezes, non-labored Skin: shiny, hyperpigmented, tender rash spanning entire inguinal crease of bilateral legs, similar presentation over vulva and labia minora, as well as to gluteal cleft, scattered areas of 81mm open wounds along these creases.  Neuro: Alert and oriented, Speech clear, No gross deficits  Assessment and plan:  Severe intertrigo - up to date recommends fluconazole 150mg  Q weekly x 4 weeks for severe presentations. Will also continue nystatin powder for drying agent - patient states this stings the open area, she should apply plain corn starch to the area for drying if unable to tolerate nystatin powder.   T2DM - the root of above, she was scheduled with Dr. Valentina Lucks later this week - to bring meds and meter. I suspect she needs spacing of her Lantus dose into two doses and likely an SGLT2 or GLP1. Motivated.   Meds ordered this encounter  Medications  .  fluconazole (DIFLUCAN) 150 MG tablet    Sig: Take 1 tablet (150 mg total) by mouth once a week.    Dispense:  4 tablet    Refill:  0  . nystatin (MYCOSTATIN/NYSTOP) powder    Sig: Apply topically 4 (four) times daily.    Dispense:  15 g    Refill:  0    Ralene Ok, MD, PGY3 09/13/2018 2:39 PM

## 2018-09-12 NOTE — Patient Instructions (Signed)
It was a pleasure to see you today! Thank you for choosing [facility] for your primary care. Isabel Berger was seen for yeast rash.   Our plans for today were:  Please come back next week to recheck the rash to make sure it's not getting worse.   Please also come back to see Dr. Valentina Lucks, our diabetes expert. Bring all of your medicines.   Use the powder daily and the oral medicine once per week for 4 weeks total.   Best,  Dr. Lindell Noe

## 2018-09-19 ENCOUNTER — Ambulatory Visit: Payer: Self-pay | Admitting: Family Medicine

## 2018-09-22 ENCOUNTER — Ambulatory Visit (INDEPENDENT_AMBULATORY_CARE_PROVIDER_SITE_OTHER): Payer: Self-pay | Admitting: Pharmacist

## 2018-09-22 ENCOUNTER — Encounter: Payer: Self-pay | Admitting: Pharmacist

## 2018-09-22 VITALS — BP 132/85 | HR 81 | Ht 68.0 in | Wt 203.6 lb

## 2018-09-22 DIAGNOSIS — E119 Type 2 diabetes mellitus without complications: Secondary | ICD-10-CM

## 2018-09-22 MED ORDER — INSULIN DEGLUDEC 200 UNIT/ML ~~LOC~~ SOPN: 80.0000 [IU] | PEN_INJECTOR | Freq: Every day | SUBCUTANEOUS | 3 refills | Status: DC

## 2018-09-22 MED ORDER — PEN NEEDLES 32G X 4 MM MISC: 1.0000 "pen " | Freq: Every day | 3 refills | Status: DC

## 2018-09-22 MED ORDER — INSULIN DEGLUDEC 200 UNIT/ML ~~LOC~~ SOPN: 80.0000 [IU] | PEN_INJECTOR | Freq: Every day | SUBCUTANEOUS | 0 refills | Status: DC

## 2018-09-22 NOTE — Patient Instructions (Addendum)
It was great to meet you today.   Stop Lantus. Start Tresiba 80 units once daily. Continue metformin 1000 mg twice daily.   Keep checking your sugars daily, especially first thing in the morning. Keep track of these numbers to be able to tell us next week when we call you (afternoon of the 6th).   If you start to get nauseated, contact clinic.   Try picking up over the counter Zantac or Pepcid to take consistently, and see if this helps with the stomach pain.   Here are some meal ideas for your favorite places: - Chik fil a: grilled nuggets, salad - Chipotle: skip the rice, light on the beans  Try to cut back on the regular sodas and increase water intake. This will help with your sugars.    Schedule follow up with is in 2-3 weeks.

## 2018-09-22 NOTE — Progress Notes (Addendum)
S:     Chief Complaint  Patient presents with  . Medication Management    Diabetes    Patient arrives in good spirits, but noting feeling extremely dehydrated and tired.  Presents for diabetes evaluation, education, and management at the request of Dr. Lindell Noe at last appointment on 09/12/2018. Patient was last seen by Primary Care Provider on 07/14/2018.   Today, she reports feeling extremely dehydrated. She has been skipping doses of insulin because she reports being unable to afford her insulin. She only has 1 vial of insulin left. She denies nausea.   She does endorse sharp stomach pain that occurs within 5 minutes of eating anything, is not resolved by Tums. She notes that she is taking naproxen or BC powders 3-4 times/week, 1 dose at a time for skin cracking from intertrigo.    Patient reports Diabetes was diagnosed in 2000 (right after she gave birth to her son)    Family/Social History: No family history of diabetes; works with mental health clients, very stressful work environment and notes that she isn't getting paid this pay period  Human resources officer affordability: Uninsured   Patient denies adherence with medications.  Current diabetes medications include: metformin 1000 mg BID, Lantus 80 units daily (skips some days) - History of [place], no GI upset Current hypertension medications include: None  Patient denies hypoglycemic events.  Patient reported dietary habits: Eats 2 meals/day Breakfast: Egg + cheese biscuit from [employer]; water  Lunch: Occasionally doesn't eat lunch; often eats fast food (chik fila); Grilled nugget + fries + regular soda Dinner: Sometimes doesn't eat supper; if she does, will pick up fast food ([employer])   Patient-reported exercise habits: tries to walk the track 3 days/week; tries to walk 2 miles   Patient reports nocturia - hourly. Patient reports neuropathy, but improvement since starting gabapentin Patient reports visual  changes. Patient denies self foot exams.    O:  Physical Exam Constitutional:      Appearance: Normal appearance.  Psychiatric:        Mood and Affect: Mood normal.    Review of Systems  All other systems reviewed and are negative. Reports dry mouth    Lab Results  Component Value Date   HGBA1C >15 07/14/2018   Vitals:   09/22/18 1612  BP: 132/85  Pulse: 81  SpO2: 91%    Lipid Panel     Component Value Date/Time   CHOL 203 (H) 06/10/2018 1038   TRIG 114 06/10/2018 1038   HDL 42 06/10/2018 1038   CHOLHDL 4.8 (H) 06/10/2018 1038   CHOLHDL 4.5 06/21/2014 1114   VLDL 22 06/21/2014 1114   LDLCALC 138 (H) 06/10/2018 1038    Home fasting CBG: Fasting 474; generally 375-500s 1 hour after lunch: too high to register Before supper: too high to register  Clinical ASCVD: No  The 10-year ASCVD risk score Mikey Bussing DC Jr., et al., 2013) is: 8.1%   Values used to calculate the score:     Age: 53 years     Sex: Female     Is Non-Hispanic African American: Yes     Diabetic: Yes     Tobacco smoker: No     Systolic Blood Pressure: 035 mmHg     Is BP treated: No     HDL Cholesterol: 42 mg/dL     Total Cholesterol: 203 mg/dL    A/P: Diabetes longstanding since 2000 currently uncontrolled. Patient is nonadherent to therapy d/t being unable to afford her Lantus,  as she is uninsured. Discussed with attending Dr. Andria Frames. She denies nausea, and is not demonstrating dehydration-related weight loss or tachycardia, showing that it is unlikely she is in DKA. Extremely high stress job is likely contributing to hyperglycemia. - Discontinue Lantus, start Tresiba U-200 80 units once daily. Provided with samples. Sent prescription to [facility] Department for MAP assistance.  - Continue metformin 1000 mg BID -Extensively discussed pathophysiology of DM, recommended lifestyle interventions, dietary effects on glycemic control. Provided recommendations for dietary choices at her  preferred fast food restaurants; will continue to work on dietary and lifestyle interventions moving forward.  -Counseled on s/sx of and management of hypoglycemia Medication Samples have been provided to the patient. Drug name: Tyler Aas 200 units/mL        Qty: 3 pens  LOT: [id number]  Exp.Date: 04/23/20  ASCVD risk - primary prevention in patient with DM. Last LDL is not controlled. ASCVD risk score is not >20%  - moderate intensity statin indicated  - Consider addition of statin therapy moving forward.    Hypertension longstanding currently controlled.  BP goal = 140 mmHg.  -Continue to monitor. Consider urine microalbumin moving forward to evaluate for benefit of ACEI/ARB therapy  #Stomach pain - Precepted with Dr. Andria Frames. Ordered lipase. Recommended trial of H2 antagonist, such as ranitidine or famotidine.  Written patient instructions provided.  Total time in face to face counseling 60 minutes.   Follow up Pharmacist Clinic Visit in 3 weeks.   Patient seen with Emeline General, PharmD Candidate and Juanell Fairly, PharmD, PGY-1 resident and Catie Darnelle Maffucci, PharmD,  PGY2 Pharmacy Resident.   Called with lab results 09/23/2018 at 2:25   Informed patient that lab values were largely as expected (normal) with a very high blood sugar as expected.  Patient reports taking 80 units of Tresiba this AM following CBG of 330 this AM.  Asked her to continue taking same dose and plan to follow up next week.

## 2018-09-22 NOTE — Assessment & Plan Note (Signed)
Diabetes longstanding since 2000 currently uncontrolled. Patient is nonadherent to therapy d/t being unable to afford her Lantus, as she is uninsured. Discussed with attending Dr. Andria Frames. She denies nausea, and is not demonstrating dehydration-related weight loss or tachycardia, showing that it is unlikely she is in DKA. Extremely high stress job is likely contributing to hyperglycemia. - Discontinue Lantus, start Tresiba U-200 80 units once daily. Provided with samples. Sent prescription to [facility] Department for MAP assistance.  - Continue metformin 1000 mg BID -Extensively discussed pathophysiology of DM, recommended lifestyle interventions, dietary effects on glycemic control. Provided recommendations for dietary choices at her preferred fast food restaurants; will continue to work on dietary and lifestyle interventions moving forward.  -Counseled on s/sx of and management of hypoglycemia

## 2018-09-23 ENCOUNTER — Telehealth: Payer: Self-pay | Admitting: Family Medicine

## 2018-09-23 LAB — COMPREHENSIVE METABOLIC PANEL
ALK PHOS: 165 IU/L — AB (ref 39–117)
ALT: 16 IU/L (ref 0–32)
AST: 14 IU/L (ref 0–40)
Albumin/Globulin Ratio: 1.4 (ref 1.2–2.2)
Albumin: 4.2 g/dL (ref 3.8–4.9)
BUN/Creatinine Ratio: 14 (ref 9–23)
BUN: 11 mg/dL (ref 6–24)
Bilirubin Total: <0.2 mg/dL (ref 0.0–1.2)
CO2: 19 mmol/L — ABNORMAL LOW (ref 20–29)
Calcium: 10.1 mg/dL (ref 8.7–10.2)
Chloride: 94 mmol/L — ABNORMAL LOW (ref 96–106)
Creatinine, Ser: 0.79 mg/dL (ref 0.57–1.00)
GFR calc Af Amer: 100 mL/min/{1.73_m2} (ref >59)
GFR calc non Af Amer: 87 mL/min/{1.73_m2} (ref >59)
Globulin, Total: 3 g/dL (ref 1.5–4.5)
Glucose: 549 mg/dL (ref 65–99)
Potassium: 4.6 mmol/L (ref 3.5–5.2)
Sodium: 132 mmol/L — ABNORMAL LOW (ref 134–144)
Total Protein: 7.2 g/dL (ref 6.0–8.5)

## 2018-09-23 LAB — LIPASE: LIPASE: 67 U/L (ref 14–72)

## 2018-09-23 NOTE — Progress Notes (Signed)
Patient ID: Isabel Berger, female   DOB: Dec 29, 1965, 53 y.o.   MRN: [id number] Reviewed: I agree with Dr. Graylin Shiver documentation and management.

## 2018-09-23 NOTE — Telephone Encounter (Signed)
Critical lab value result. Glucose 549, all other labs were normal. Will forward to PCP as this is nonemergent.  Bufford Lope, DO PGY-3, [facility] 09/23/2018 7:00 AM

## 2018-10-13 ENCOUNTER — Ambulatory Visit (INDEPENDENT_AMBULATORY_CARE_PROVIDER_SITE_OTHER): Payer: Self-pay | Admitting: Pharmacist

## 2018-10-13 VITALS — BP 122/84 | HR 67

## 2018-10-13 DIAGNOSIS — E119 Type 2 diabetes mellitus without complications: Secondary | ICD-10-CM

## 2018-10-13 MED ORDER — SEMAGLUTIDE(0.25 OR 0.5MG/DOS) 2 MG/1.5ML ~~LOC~~ SOPN: 0.5000 mg | PEN_INJECTOR | SUBCUTANEOUS | 2 refills | Status: DC

## 2018-10-13 MED ORDER — SEMAGLUTIDE(0.25 OR 0.5MG/DOS) 2 MG/1.5ML ~~LOC~~ SOPN: 0.2500 mg | PEN_INJECTOR | SUBCUTANEOUS | 2 refills | Status: DC

## 2018-10-13 MED ORDER — INSULIN DEGLUDEC 200 UNIT/ML ~~LOC~~ SOPN: 80.0000 [IU] | PEN_INJECTOR | Freq: Every day | SUBCUTANEOUS | 0 refills | Status: DC

## 2018-10-13 NOTE — Assessment & Plan Note (Signed)
Diabetes longstanding currently uncontrolled with last A1c >15%. Patient is able to verbalize appropriate hypoglycemia management plan. Patient is adherent with medication. Control is suboptimal due to diet, but her BG readings continue to improve from prior. -Continued basal insulin degludec Tyler Aas) 80 units daily.   -Started Ozempic (semaglutide) 0.25 mg weekly for 4 weeks, then increase as tolerated.  -Extensively discussed pathophysiology of DM, recommended lifestyle interventions, dietary effects on glycemic control -Counseled on s/sx of and management of hypoglycemia -Next A1C anticipated at next clinic visit.  -Recommended an eye exam. -Recommended she discuss [place] availability with health department

## 2018-10-13 NOTE — Progress Notes (Signed)
S:    Chief Complaint  Patient presents with  . Medication Management    diabetes   Patient arrives in good spirits, ambulating without assistance.  Presents for diabetes evaluation, education, and management at the request of Dr. Lindell Noe at last appointment on 09/12/2018. Patient was last seen by Primary Care Provider on 07/14/2018. She is upset about her day at work upon arrival however she states her blood sugars are much improved.   She is excited about improved glucose control. Patient reports adherence with medications. She reports continued headaches but she no longer wakes up to urinate at night. Does report stomach problems no matter what she eats. She does check her feet and denies problems. Denies neuropathy or recent vision changes. She is frustrated that she continues to gain weight. -Current diabetes medications include: Tresiba 80 units daily, Metformin 1000mg  BID  Insurance coverage/medication affordability: She plans to follow-up with the health department regarding her Tyler Aas access.  Patient reports walking for exercise 1.5 miles x3 days/week  Patient brings in her BG meter, as she checks at morning/lunch/dinner, and PRN  -Denies hypoglycemic events. Lowest BG in recent memory was 110.  -Patient's BG averaging 150-250  Patient reported dietary habits: She continues to improve her diet -Egg and cheese biscuit in the morning for breakfast -She does eat fast food, but tries to get salads -Not eating dinner as she is tired -Drinking mostly water. Now down from drinking 3 sodas/day to 1  Patient brings in her BG meter, as she checks at morning/lunch/dinner, and PRN    O:  Physical Exam Constitutional:      Appearance: Normal appearance.  Neurological:     Mental Status: She is alert.    Review of Systems  All other systems reviewed and are negative.    Lab Results  Component Value Date   HGBA1C >15 07/14/2018   Vitals:   10/13/18 1416  BP: 122/84    Pulse: 67  SpO2: 99%    Lipid Panel     Component Value Date/Time   CHOL 203 (H) 06/10/2018 1038   TRIG 114 06/10/2018 1038   HDL 42 06/10/2018 1038   CHOLHDL 4.8 (H) 06/10/2018 1038   CHOLHDL 4.5 06/21/2014 1114   VLDL 22 06/21/2014 1114   LDLCALC 138 (H) 06/10/2018 1038     Clinical ASCVD: No  The 10-year ASCVD risk score Mikey Bussing DC Jr., et al., 2013) is: 6.1%   Values used to calculate the score:     Age: 53 years     Sex: Female     Is Non-Hispanic African American: Yes     Diabetic: Yes     Tobacco smoker: No     Systolic Blood Pressure: 660 mmHg     Is BP treated: No     HDL Cholesterol: 42 mg/dL     Total Cholesterol: 203 mg/dL    A/P: Diabetes longstanding currently uncontrolled with last A1c >15%. Patient is able to verbalize appropriate hypoglycemia management plan. Patient is adherent with medication. Control is suboptimal due to diet, but her BG readings continue to improve from prior. -Continued basal insulin degludec Tyler Aas) 80 units daily.   -Started Ozempic (semaglutide) 0.25 mg weekly for 4 weeks, then increase as tolerated.  -Extensively discussed pathophysiology of DM, recommended lifestyle interventions, dietary effects on glycemic control -Counseled on s/sx of and management of hypoglycemia -Next A1C anticipated at next clinic visit.  -Recommended an eye exam. -Recommended she discuss [place] availability with health department  ASCVD risk - primary prevention in patient with DM. Last LDL is not controlled. ASCVD risk score is not >20%   -Will discuss addition of statin at next clinic visit.  Hypertension longstanding currently controlled.  BP goal = 130 mmHg.   Written patient instructions provided.  Total time in face to face counseling 30 minutes.   Follow up Pharmacist Clinic Visit in 4 weeks.  Emeline General, PharmD Candidate, Janae Bridgeman, PharmD, PGY1 resident and Courtney Heys, PharmD,  PGY2 Pharmacy Resident.

## 2018-10-13 NOTE — Patient Instructions (Addendum)
It was great to see you today!4 week  We are going to start a new medication - Ozempic (semaglutide) 0.25 mg once weekly. Use this dose for 4 weeks, then increase to 0.5 mg once weekly if you are not having terrible stomach upset.   Continue Tresiba 80 units once daily and metformin 1000 mg twice daily.   Keep checking your blood sugars. If you start to see many sugars less than 120 OR you start to feel like you're having low blood sugars, call clinic and we can adjust your insulin dose if needed.   Keep up the great work with walking. See if you can increase to 4 days a week, or walk 2 miles a few days a week.   Try Pepcid in the evenings for about a week. If that doesn't help, try adding a second dose in the morning.    Schedule follow up with [place] in about 4 weeks.

## 2018-10-14 NOTE — Progress Notes (Signed)
Patient ID: Isabel Berger, female   DOB: [birth year], 53 y.o.   MRN: [id number] REviewed: agree with the documentation and management of Dr. Valentina Lucks.

## 2018-10-21 ENCOUNTER — Ambulatory Visit: Payer: No Typology Code available for payment source | Admitting: Family Medicine

## 2018-11-11 ENCOUNTER — Telehealth: Payer: Self-pay | Admitting: *Deleted

## 2018-11-11 DIAGNOSIS — E119 Type 2 diabetes mellitus without complications: Secondary | ICD-10-CM

## 2018-11-11 MED ORDER — SEMAGLUTIDE(0.25 OR 0.5MG/DOS) 2 MG/1.5ML ~~LOC~~ SOPN: 0.2500 mg | PEN_INJECTOR | SUBCUTANEOUS | 2 refills | Status: DC

## 2018-11-11 MED ORDER — INSULIN DEGLUDEC 200 UNIT/ML ~~LOC~~ SOPN: 80.0000 [IU] | PEN_INJECTOR | Freq: Every day | SUBCUTANEOUS | 0 refills | Status: DC

## 2018-11-11 NOTE — Telephone Encounter (Signed)
Thank you for helping her out with that.  I can try calling her this afternoon as well to let her know she can get them.

## 2018-11-11 NOTE — Telephone Encounter (Signed)
Pt calls and states that she is almost out of her Ozempic and tresiba.  She has not heard from the health department.  She is wondering if we have samples she can have.    Attempted to call back, no answer.  LMOVM to return call.   Have placed samples in Fridge.  Christen Bame, CMA

## 2018-11-11 NOTE — Telephone Encounter (Signed)
Attempted to call again.  Straight to VM. Christen Bame, CMA

## 2018-11-14 NOTE — Telephone Encounter (Signed)
Pt called nurse line, I informed her we have samples of the medication in our refrigerator. Pt very appreciative.

## 2018-11-28 ENCOUNTER — Telehealth: Payer: Self-pay

## 2018-11-28 DIAGNOSIS — E119 Type 2 diabetes mellitus without complications: Secondary | ICD-10-CM

## 2018-11-28 MED ORDER — INSULIN DEGLUDEC 200 UNIT/ML ~~LOC~~ SOPN: 70.0000 [IU] | PEN_INJECTOR | Freq: Every day | SUBCUTANEOUS | Status: DC

## 2018-11-28 MED ORDER — SEMAGLUTIDE(0.25 OR 0.5MG/DOS) 2 MG/1.5ML ~~LOC~~ SOPN: 0.5000 mg | PEN_INJECTOR | SUBCUTANEOUS | Status: DC

## 2018-11-28 NOTE — Telephone Encounter (Signed)
Patient reports higher blood glucose readings in the morning for the last week.   One week ago her fasting blood sugars were ~ 100.  This week she reports her fasting blood sugars are > 200 with this AM reading of 215.   She is also concerned that she is gaining weight on her current regimen.  She takes her semaglutide 0.25mg  once weekly on Sunday and take her Tresbia 80 units in the AM.     Advised to take an additional semaglutide 0.25mg  dose today (now) and DECREASE her tresiba (insulin degludec) from 80 units to 70 units.  She verbalized understanding of treatment change.   She reported adequate supply for 1 week of therapy.  We reviewed symptoms of hypoglycemia and potential GI adverse effects from increased dose of semaglutide.   We agreed to follow up in 1 week with a phone call.  She was asked to follow up with the Health Department to optain MAP support.

## 2018-11-28 NOTE — Addendum Note (Signed)
Addended by: Leavy Cella on: 11/28/2018 06:06 PM   Modules accepted: Orders

## 2018-11-28 NOTE — Telephone Encounter (Signed)
Pt called nurse line requesting to speak with Dr. Valentina Lucks. Pt stated her weight and her sugars have been trending up. Pt stated her sugar this am was 209, her sugars usually run 75-110. Pt stated she was under 200 in January, and now weights 215. Pt is taking her insulin as directed. Semaglutide 0.25 once a week and Tresiba 80 unites daily. Pt stated on top of weight gain and higher cbgs, the patient states she has been feeling very sluggish. Will forward to [facility] and PCP.   I am happy to schedule a telehealth visit. Not sure if you are doing those Dr. Valentina Lucks?

## 2018-12-05 ENCOUNTER — Telehealth: Payer: Self-pay | Admitting: Pharmacist

## 2018-12-05 NOTE — Telephone Encounter (Signed)
Called to discuss blood sugar control and Ozempic tolerability.   States blood sugars have been ~ low 100s and she denies any low readings< 100 She states she has been nauseated with use of higher dose of Ozempic.  States she is eating "kid's portions" and the smell of food makes her nauseated.  She denied vomiting.   She acknowledged that the nausea was significant however states she is tolerating the nausea effectively.  We discussed how tolerability will improve with continued use.   We also discussed how we can use a dose between the 0.25mg  and 0.5mg  dose.    She understood that her tolerability will determine dose NEXT Sunday (in 6 days). She also requested additonal samples of the Tresiba 200units/ml pens.   New samples prepared to pick-up on 4/14 in the AM.

## 2018-12-06 NOTE — Telephone Encounter (Signed)
Medication Samples have been provided to the patient.  Drug name: Sandrea Matte: 2 pens  LOT: [id number]  Exp.Date: 04/23/2020  The patient has been instructed regarding the correct time, dose, and frequency of taking this medication, including desired effects and most common side effects.   Left in refrigerator for patient pick-up.  Anticipate MAP supply should arrive soon and patient should not need additional samples.   Janeann Forehand 8:40 12/06/2018

## 2018-12-06 NOTE — Telephone Encounter (Signed)
Reviewed and agree.

## 2018-12-22 ENCOUNTER — Encounter: Payer: Self-pay | Admitting: Family Medicine

## 2019-01-11 ENCOUNTER — Ambulatory Visit (INDEPENDENT_AMBULATORY_CARE_PROVIDER_SITE_OTHER): Payer: Self-pay | Admitting: Family Medicine

## 2019-01-11 ENCOUNTER — Encounter: Payer: Self-pay | Admitting: Family Medicine

## 2019-01-11 ENCOUNTER — Other Ambulatory Visit: Payer: Self-pay

## 2019-01-11 VITALS — BP 132/80 | HR 83 | Temp 98.0°F

## 2019-01-11 DIAGNOSIS — R1032 Left lower quadrant pain: Secondary | ICD-10-CM | POA: Insufficient documentation

## 2019-01-11 MED ORDER — ONDANSETRON HCL 4 MG PO TABS: 4.0000 mg | ORAL_TABLET | Freq: Three times a day (TID) | ORAL | 0 refills | Status: DC | PRN

## 2019-01-11 MED ORDER — AMOXICILLIN-POT CLAVULANATE 875-125 MG PO TABS: 1.0000 | ORAL_TABLET | Freq: Two times a day (BID) | ORAL | 0 refills | Status: AC

## 2019-01-11 NOTE — Patient Instructions (Signed)
It was great to meet you today! Thank you for letting me participate in your care!  Today, we discussed your abdominal pain which could be another episode of diverticulitis. Please pick up the antibiotics I sent to the pharmacy and begin taking today.  Please schedule a telephone visit with [place] in 3 days so we know how you are doing. If you develop worsening symptoms, fever, or uncontrollable vomiting please go to the ED immediately.   Diverticulitis  Diverticulitis is when small pockets in your large intestine (colon) get infected or swollen. This causes stomach pain and watery poop (diarrhea). These pouches are called diverticula. They form in people who have a condition called diverticulosis. Follow these instructions at home: Medicines  Take over-the-counter and prescription medicines only as told by your doctor. These include: ? Antibiotics. ? Pain medicines. ? Fiber pills. ? Probiotics. ? Stool softeners.  Do not drive or use heavy machinery while taking prescription pain medicine.  If you were prescribed an antibiotic, take it as told. Do not stop taking it even if you feel better. General instructions   Follow a diet as told by your doctor.  When you feel better, your doctor may tell you to change your diet. You may need to eat a lot of fiber. Fiber makes it easier to poop (have bowel movements). Healthy foods with fiber include: ? Berries. ? Beans. ? Lentils. ? Green vegetables.  Exercise 3 or more times a week. Aim for 30 minutes each time. Exercise enough to sweat and make your heart beat faster.  Keep all follow-up visits as told. This is important. You may need to have an exam of the large intestine. This is called a colonoscopy. Contact a doctor if:  Your pain does not get better.  You have a hard time eating or drinking.  You are not pooping like normal. Get help right away if:  Your pain gets worse.  Your problems do not get better.  Your problems get  worse very fast.  You have a fever.  You throw up (vomit) more than one time.  You have poop that is: ? Bloody. ? Black. ? Tarry. Summary  Diverticulitis is when small pockets in your large intestine (colon) get infected or swollen.  Take medicines only as told by your doctor.  Follow a diet as told by your doctor. This information is not intended to replace advice given to you by your health care provider. Make sure you discuss any questions you have with your health care provider. Document Released: 01/27/2008 Document Revised: 08/27/2016 Document Reviewed: 08/27/2016 Elsevier Interactive Patient Education  2019 [place] well, Harolyn Rutherford, DO PGY-2, [facility]

## 2019-01-11 NOTE — Progress Notes (Signed)
Subjective: Chief Complaint  Patient presents with  . Abdominal Pain     HPI: Isabel Berger is a 53 y.o. presenting to clinic today to discuss the following:  Abdominal Pain Patient states she began having abdominal pain last night. It is located primarily in the left side of her abdomen and radiates to her midsection but not her back. She did have one episode of emesis (NBNB) and has associated nausea with decreased appetite. The pain is worse with movement and on pushing on her abdomen but better with pressure. Her pain has gotten progressively worse throughout the day. She reports she does have hard to pass stools at times, last BM was this morning, no blood was seen. She states she has been diagnosed with diverticulitis in the past and this feels "just like it did the last time".  She denies fever, chills, dysuria, urinary frequency, CP, or SOB.     ROS noted in HPI.   Past Medical, Surgical, Social, and Family History Reviewed & Updated per EMR.   Pertinent Historical Findings include:   Social History   Tobacco Use  Smoking Status Never Smoker  Smokeless Tobacco Never Used    Objective: BP 132/80   Pulse 83   SpO2 100%  Vitals and nursing notes reviewed  Physical Exam Gen: Alert and Oriented x 3, NAD HEENT: Normocephalic, atraumatic CV: RRR, no murmurs, normal S1, S2 split Resp: CTAB, no wheezing, rales, or rhonchi, comfortable work of breathing Abd: non-distended, tender to palpation diffusely but more in LUQ and LLQ, no guarding or rebound tenderness, soft, +bs in all four quadrants, + Murphy sign Ext: no clubbing, cyanosis, or edema Skin: warm, dry, intact, no rashes   Results for orders placed or performed in visit on 01/11/19 (from the past 72 hour(s))  CBC with Differential     Status: Abnormal   Collection Time: 01/11/19 11:58 AM  Result Value Ref Range   WBC 11.1 (H) 3.4 - 10.8 x10E3/uL   RBC 4.09 3.77 - 5.28 x10E6/uL   Hemoglobin 12.3 11.1  - 15.9 g/dL   Hematocrit 36.0 34.0 - 46.6 %   MCV 88 79 - 97 fL   MCH 30.1 26.6 - 33.0 pg   MCHC 34.2 31.5 - 35.7 g/dL   RDW 12.7 11.7 - 15.4 %   Platelets 413 150 - 450 x10E3/uL   Neutrophils 62 Not Estab. %   Lymphs 31 Not Estab. %   Monocytes 6 Not Estab. %   Eos 0 Not Estab. %   Basos 1 Not Estab. %   Neutrophils Absolute 6.9 1.4 - 7.0 x10E3/uL   Lymphocytes Absolute 3.4 (H) 0.7 - 3.1 x10E3/uL   Monocytes Absolute 0.7 0.1 - 0.9 x10E3/uL   EOS (ABSOLUTE) 0.0 0.0 - 0.4 x10E3/uL   Basophils Absolute 0.1 0.0 - 0.2 x10E3/uL   Immature Granulocytes 0 Not Estab. %   Immature Grans (Abs) 0.0 0.0 - 0.1 x10E3/uL  Comprehensive metabolic panel     Status: None   Collection Time: 01/11/19 11:58 AM  Result Value Ref Range   Glucose 89 65 - 99 mg/dL   BUN 10 6 - 24 mg/dL   Creatinine, Ser 0.65 0.57 - 1.00 mg/dL   GFR calc non Af Amer 103 >59 mL/min/1.73   GFR calc Af Amer 119 >59 mL/min/1.73   BUN/Creatinine Ratio 15 9 - 23   Sodium 139 134 - 144 mmol/L   Potassium 4.5 3.5 - 5.2 mmol/L   Chloride 103  96 - 106 mmol/L   CO2 23 20 - 29 mmol/L   Calcium 9.7 8.7 - 10.2 mg/dL   Total Protein 7.2 6.0 - 8.5 g/dL   Albumin 4.3 3.8 - 4.9 g/dL   Globulin, Total 2.9 1.5 - 4.5 g/dL   Albumin/Globulin Ratio 1.5 1.2 - 2.2   Bilirubin Total 0.2 0.0 - 1.2 mg/dL   Alkaline Phosphatase 101 39 - 117 IU/L   AST 14 0 - 40 IU/L   ALT 13 0 - 32 IU/L  Lipase     Status: None   Collection Time: 01/11/19 11:58 AM  Result Value Ref Range   Lipase 59 14 - 72 U/L    Assessment/Plan:  Left lower quadrant abdominal pain DDx broad; includes diverticulitis vs ileus vs pancreatitis vs nephrolithiasis vs severe constipation. Most likely another episode of diverticulitis as she has symptoms and pain in the pattern consistent with diverticulitis with an abrupt onset. Low suspicion for gallbladder etiology even thought she did have + Murphy sign on exam as pain is constant and not associated with eating.  Nephrolithiasis less likely as she does not have flank pain and no urinary symptoms. BM this am makes severe constipation or ileus less likely. - Augmentin 875-125mg  BID for 7 days - Zofran for nasuea - CMP and Lipase to rule out other causes - CBC to check for signs of infection - F/u in 3 days and if no improvement instructed patient to go to the ED. Will most likely need CT Abd/Pelvis and possible admission if no improvement.    PATIENT EDUCATION PROVIDED: See AVS    Diagnosis and plan along with any newly prescribed medication(s) were discussed in detail with this patient today. The patient verbalized understanding and agreed with the plan. Patient advised if symptoms worsen return to clinic or ER.    Orders Placed This Encounter  Procedures  . CBC with Differential  . Comprehensive metabolic panel    Order Specific Question:   Has the patient fasted?    Answer:   No  . Lipase    Meds ordered this encounter  Medications  . amoxicillin-clavulanate (AUGMENTIN) 875-125 MG tablet    Sig: Take 1 tablet by mouth 2 (two) times daily for 7 days.    Dispense:  14 tablet    Refill:  0  . ondansetron (ZOFRAN) 4 MG tablet    Sig: Take 1 tablet (4 mg total) by mouth every 8 (eight) hours as needed for nausea or vomiting.    Dispense:  20 tablet    Refill:  0     Harolyn Rutherford, DO 01/11/2019, 11:43 AM PGY-2 [facility]

## 2019-01-12 ENCOUNTER — Telehealth: Payer: Self-pay | Admitting: *Deleted

## 2019-01-12 LAB — COMPREHENSIVE METABOLIC PANEL
ALT: 13 IU/L (ref 0–32)
AST: 14 IU/L (ref 0–40)
Albumin/Globulin Ratio: 1.5 (ref 1.2–2.2)
Albumin: 4.3 g/dL (ref 3.8–4.9)
Alkaline Phosphatase: 101 IU/L (ref 39–117)
BUN/Creatinine Ratio: 15 (ref 9–23)
BUN: 10 mg/dL (ref 6–24)
Bilirubin Total: 0.2 mg/dL (ref 0.0–1.2)
CO2: 23 mmol/L (ref 20–29)
Calcium: 9.7 mg/dL (ref 8.7–10.2)
Chloride: 103 mmol/L (ref 96–106)
Creatinine, Ser: 0.65 mg/dL (ref 0.57–1.00)
GFR calc Af Amer: 119 mL/min/{1.73_m2} (ref >59)
GFR calc non Af Amer: 103 mL/min/{1.73_m2} (ref >59)
Globulin, Total: 2.9 g/dL (ref 1.5–4.5)
Glucose: 89 mg/dL (ref 65–99)
Potassium: 4.5 mmol/L (ref 3.5–5.2)
Sodium: 139 mmol/L (ref 134–144)
Total Protein: 7.2 g/dL (ref 6.0–8.5)

## 2019-01-12 LAB — CBC WITH DIFFERENTIAL/PLATELET
Basophils Absolute: 0.1 10*3/uL (ref 0.0–0.2)
Basos: 1 %
EOS (ABSOLUTE): 0 10*3/uL (ref 0.0–0.4)
Eos: 0 %
Hematocrit: 36 % (ref 34.0–46.6)
Hemoglobin: 12.3 g/dL (ref 11.1–15.9)
Immature Grans (Abs): 0 10*3/uL (ref 0.0–0.1)
Immature Granulocytes: 0 %
Lymphocytes Absolute: 3.4 10*3/uL — ABNORMAL HIGH (ref 0.7–3.1)
Lymphs: 31 %
MCH: 30.1 pg (ref 26.6–33.0)
MCHC: 34.2 g/dL (ref 31.5–35.7)
MCV: 88 fL (ref 79–97)
Monocytes Absolute: 0.7 10*3/uL (ref 0.1–0.9)
Monocytes: 6 %
Neutrophils Absolute: 6.9 10*3/uL (ref 1.4–7.0)
Neutrophils: 62 %
Platelets: 413 10*3/uL (ref 150–450)
RBC: 4.09 x10E6/uL (ref 3.77–5.28)
RDW: 12.7 % (ref 11.7–15.4)
WBC: 11.1 10*3/uL — ABNORMAL HIGH (ref 3.4–10.8)

## 2019-01-12 LAB — LIPASE: Lipase: 59 U/L (ref 14–72)

## 2019-01-12 NOTE — Telephone Encounter (Signed)
Pt is requesting pain meds for her abdominal pain she was seen for yesterday.  Will forward to Dr. Garlan Fillers. Christen Bame, CMA

## 2019-01-13 ENCOUNTER — Telehealth: Payer: Self-pay | Admitting: Family Medicine

## 2019-01-13 ENCOUNTER — Other Ambulatory Visit: Payer: Self-pay

## 2019-01-13 NOTE — Assessment & Plan Note (Addendum)
DDx broad; includes diverticulitis vs ileus vs pancreatitis vs nephrolithiasis vs severe constipation. Most likely another episode of diverticulitis as she has symptoms and pain in the pattern consistent with diverticulitis with an abrupt onset. Low suspicion for gallbladder etiology even thought she did have + Murphy sign on exam as pain is constant and not associated with eating. Nephrolithiasis less likely as she does not have flank pain and no urinary symptoms. BM this am makes severe constipation or ileus less likely. - Augmentin 875-125mg  BID for 7 days - Zofran for nasuea - CMP and Lipase to rule out other causes - CBC to check for signs of infection - F/u in 3 days and if no improvement instructed patient to go to the ED. Will most likely need CT Abd/Pelvis and possible admission if no improvement.

## 2019-01-30 ENCOUNTER — Ambulatory Visit (INDEPENDENT_AMBULATORY_CARE_PROVIDER_SITE_OTHER): Payer: Self-pay | Admitting: Student in an Organized Health Care Education/Training Program

## 2019-01-30 ENCOUNTER — Other Ambulatory Visit: Payer: Self-pay

## 2019-01-30 VITALS — BP 112/62 | HR 105 | Temp 98.4°F | Wt 217.8 lb

## 2019-01-30 DIAGNOSIS — K5900 Constipation, unspecified: Secondary | ICD-10-CM | POA: Insufficient documentation

## 2019-01-30 DIAGNOSIS — R109 Unspecified abdominal pain: Secondary | ICD-10-CM | POA: Insufficient documentation

## 2019-01-30 DIAGNOSIS — R1032 Left lower quadrant pain: Secondary | ICD-10-CM

## 2019-01-30 MED ORDER — POLYETHYLENE GLYCOL 3350 17 GM/SCOOP PO POWD: 17.0000 g | Freq: Two times a day (BID) | ORAL | 1 refills | Status: DC | PRN

## 2019-01-30 MED ORDER — DOCUSATE SODIUM 50 MG PO CAPS: 50.0000 mg | ORAL_CAPSULE | Freq: Two times a day (BID) | ORAL | 0 refills | Status: DC

## 2019-01-30 NOTE — Assessment & Plan Note (Signed)
Pain is been persistent over the last 2 weeks.  It improved slightly with Bactrim however never fully resolved.  She reports that her last bowel movement was 2 days ago and it was hard and small.  She feels very constipated and bloated.  She has not taken anything for this.  She does have some nausea for which she is taking Zofran.  She does have a history of abdominal surgery with previous hysterectomy, however she is keeping down small amounts of food and liquid at this time which make a bowel obstruction less likely. -Check CBC; if white blood cell count is significantly elevated will consider treating for diverticulitis.   -MiraLAX 1 capful twice daily titrate to 1-2 soft bowel movements per day -Encourage p.o. hydration -Added Colace -Strict return precautions; if pain is significantly worsening she will likely need a CT abdomen
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
## 2019-01-30 NOTE — Patient Instructions (Signed)
It was a pleasure seeing you today in our clinic. Here is the treatment plan we have discussed and agreed upon together:  Please titrate the miralax until you have 1-2 soft stools per day.  If your symptoms become much worse please call us back or go to the ED.  We drew blood work at today's visit. I will call or send you a letter with these results. If you do not hear from me within the next week, please give our office a call.  Our clinic's number is [phone]. Please call with questions or concerns about what we discussed today.  Be well, Dr. Burr Medico

## 2019-01-31 ENCOUNTER — Encounter: Payer: Self-pay | Admitting: Student in an Organized Health Care Education/Training Program

## 2019-01-31 LAB — CBC
Hematocrit: 37.2 % (ref 34.0–46.6)
Hemoglobin: 13 g/dL (ref 11.1–15.9)
MCH: 30 pg (ref 26.6–33.0)
MCHC: 34.9 g/dL (ref 31.5–35.7)
MCV: 86 fL (ref 79–97)
Platelets: 453 10*3/uL — ABNORMAL HIGH (ref 150–450)
RBC: 4.33 x10E6/uL (ref 3.77–5.28)
RDW: 12.6 % (ref 11.7–15.4)
WBC: 10.8 10*3/uL (ref 3.4–10.8)

## 2019-02-03 ENCOUNTER — Ambulatory Visit (INDEPENDENT_AMBULATORY_CARE_PROVIDER_SITE_OTHER): Payer: Self-pay | Admitting: Student in an Organized Health Care Education/Training Program

## 2019-02-03 ENCOUNTER — Other Ambulatory Visit: Payer: Self-pay

## 2019-02-03 ENCOUNTER — Encounter: Payer: Self-pay | Admitting: Student in an Organized Health Care Education/Training Program

## 2019-02-03 VITALS — BP 118/72 | HR 115 | Temp 99.3°F | Resp 18

## 2019-02-03 DIAGNOSIS — M7989 Other specified soft tissue disorders: Secondary | ICD-10-CM

## 2019-02-03 DIAGNOSIS — K5900 Constipation, unspecified: Secondary | ICD-10-CM

## 2019-02-03 MED ORDER — MILK OF MAGNESIA 7.75 % PO SUSP: 30.0000 mL | Freq: Every day | ORAL | 0 refills | Status: DC | PRN

## 2019-02-03 NOTE — Progress Notes (Signed)
CC: constipation  HPI: Isabel Berger is a 53 y.o. female with PMH significant for poorly controlled T2DM who presents to [facility] today with abdominal distention and inability to have a bowel movement of 1 week duration.   Patient was previously seen 4 days ago in our clinic for constipation.  At that time her abdomen was noted to be soft and mildly tender in the right upper and lower quadrants.  CBC was collected to evaluate white count, with the plan to treat for diverticulitis of the white count was significantly elevated.  There was no leukocytosis.  She was started on MiraLAX and Colace.  She was advised to titrate up MiraLAX to 1-2 soft bowel movements per day.   Today she reports that she has been taking MiraLAX 2 capfuls twice a day and Colace.  She is yet to have a bowel movement.  She has some nausea but no vomiting.  She remains afebrile.  She is able to function, walk around, and behave normally.  She does feel that her stomach is distended.  Additionally she notes right lower extremity swelling to the ankle which began yesterday.  Patient reports that she would really like to have a bowel movement.  She reports that she gave herself an enema yesterday without a subsequent bowel movement.  No fevers, congestion, rhinorrhea.  No cough.  No shortness of breath.  No V/D/C.  No urinary symptoms.   Review of Symptoms:  See HPI for ROS.   CC, SH/smoking status, and VS noted.  Objective: BP 118/72 (BP Location: Right Arm, Patient Position: Sitting, Cuff Size: Large)   Pulse (!) 115   Temp 99.3 F (37.4 C) (Oral)   Resp 18   SpO2 98%  GEN: Pleasant and interactive, cooperative, alert, no acute distress, nontoxic appearing NECK: full ROM RESPIRATORY: clear to auscultation bilaterally with no wheezes, rhonchi or rales, good effort CV: RRR, no m/r/g, no peripheral edema GI: soft, mildly tender to deep palpation in the right upper and lower quadrants, no rebound tenderness, no guarding,  normal active bowel sounds, non-distended, no hepatosplenomegaly SKIN: warm and dry, no rashes or lesions NEURO: II-XII grossly intact, normal gait PSYCH: AAOx3, appropriate affect EXT: RLE 1+ pitting edema to mid shin.  No erythema.  No warmth.  No tenderness to palpation.  Negative Homans sign.  Assessment and plan:  Constipation Patient continues to be very well-appearing and nontoxic although she reports continued discomfort and she really would like to have a bowel movement.  -Continue MiraLAX and Colace - add magnesium hydroxide (MILK OF MAGNESIA) 400 MG/5ML suspension; Take 30 mLs by mouth daily as needed for mild constipation.  Dispense: 355 mL; Refill: 0 - Strict return precautions to the ED over the weekend include fever, worsening pain, worsening distention, or systemic illness -Encourage p.o. hydration -Of note patient started semaglutide 4 months ago, GI upset and constipation may be a side effect of this medication.  Would be unusual for this to start 4 months after initiation of therapy.  Consider de-escalation or discontinuing this medication if GI symptoms persist. -Consider referral for colonoscopy for age-appropriate cancer screening once acute constipation is resolved.  Localized swelling of lower extremity Mild but unilateral. No redness or warmth.  Seems very unlikely to be a DVT.  No evidence of infection.  However, to be safe we will order a d-dimer.  -Late entry, overnight team was called and stat lab was reported.  D-dimer was found to be 0.39 which is negative. -  Compression stockings, DASH diet, lower extremity elevation, monitor - precepted with Dr. Erin Hearing  Orders Placed This Encounter  Procedures  . D-dimer, quantitative (not at [facility])    Meds ordered this encounter  Medications  . magnesium hydroxide (MILK OF MAGNESIA) 400 MG/5ML suspension    Sig: Take 30 mLs by mouth daily as needed for mild constipation.    Dispense:  355 mL    Refill:  0     Everrett Coombe, MD,MS,  PGY3 02/03/2019 9:03 PM

## 2019-02-03 NOTE — Assessment & Plan Note (Addendum)
Mild but unilateral. No redness or warmth.  Seems very unlikely to be a DVT.  No evidence of infection.  However, to be safe we will order a d-dimer.  -Late entry, overnight team was called and stat lab was reported.  D-dimer was found to be 0.39 which is negative. -Compression stockings, DASH diet, lower extremity elevation, monitor

## 2019-02-03 NOTE — Patient Instructions (Addendum)
It was a pleasure seeing you today in our clinic.   We drew blood work at today's visit. I will call or send you a letter with these results. If you do not hear from me within the next week, please give our office a call.  If your pain becomes significantly worse, or if you develop fevers or start vomiting please go to the ED.    Our clinic's number is [phone]. Please call with questions or concerns about what we discussed today.  Be well, Dr. Burr Medico

## 2019-02-03 NOTE — Assessment & Plan Note (Addendum)
Patient continues to be very well-appearing and nontoxic although she reports continued discomfort and she really would like to have a bowel movement.  -Continue MiraLAX and Colace - add magnesium hydroxide (MILK OF MAGNESIA) 400 MG/5ML suspension; Take 30 mLs by mouth daily as needed for mild constipation.  Dispense: 355 mL; Refill: 0 - Strict return precautions to the ED over the weekend include fever, worsening pain, worsening distention, or systemic illness -Encourage p.o. hydration -Of note patient started semaglutide 4 months ago, GI upset and constipation may be a side effect of this medication.  Would be unusual for this to start 4 months after initiation of therapy.  Consider de-escalation or discontinuing this medication if GI symptoms persist. -Consider referral for colonoscopy for age-appropriate cancer screening once acute constipation is resolved.

## 2019-02-04 LAB — D-DIMER, QUANTITATIVE (NOT AT ARMC): D-DIMER: 0.39 mg/L FEU (ref 0.00–0.49)

## 2019-02-09 ENCOUNTER — Emergency Department (HOSPITAL_BASED_OUTPATIENT_CLINIC_OR_DEPARTMENT_OTHER)
Admission: EM | Admit: 2019-02-09 | Discharge: 2019-02-09 | Disposition: A | Discharge status: Home / Self Care | Payer: Self-pay | Attending: Emergency Medicine | Admitting: Emergency Medicine

## 2019-02-09 ENCOUNTER — Encounter (HOSPITAL_BASED_OUTPATIENT_CLINIC_OR_DEPARTMENT_OTHER): Payer: Self-pay

## 2019-02-09 ENCOUNTER — Other Ambulatory Visit: Payer: Self-pay

## 2019-02-09 ENCOUNTER — Telehealth: Payer: Self-pay | Admitting: *Deleted

## 2019-02-09 ENCOUNTER — Emergency Department (HOSPITAL_BASED_OUTPATIENT_CLINIC_OR_DEPARTMENT_OTHER): Payer: Self-pay

## 2019-02-09 DIAGNOSIS — Z79899 Other long term (current) drug therapy: Secondary | ICD-10-CM | POA: Insufficient documentation

## 2019-02-09 DIAGNOSIS — R1084 Generalized abdominal pain: Secondary | ICD-10-CM | POA: Insufficient documentation

## 2019-02-09 DIAGNOSIS — R1012 Left upper quadrant pain: Secondary | ICD-10-CM | POA: Insufficient documentation

## 2019-02-09 DIAGNOSIS — R112 Nausea with vomiting, unspecified: Secondary | ICD-10-CM | POA: Insufficient documentation

## 2019-02-09 DIAGNOSIS — K5900 Constipation, unspecified: Secondary | ICD-10-CM | POA: Insufficient documentation

## 2019-02-09 DIAGNOSIS — E1140 Type 2 diabetes mellitus with diabetic neuropathy, unspecified: Secondary | ICD-10-CM | POA: Insufficient documentation

## 2019-02-09 DIAGNOSIS — Z794 Long term (current) use of insulin: Secondary | ICD-10-CM | POA: Insufficient documentation

## 2019-02-09 DIAGNOSIS — I10 Essential (primary) hypertension: Secondary | ICD-10-CM | POA: Insufficient documentation

## 2019-02-09 LAB — CBC WITH DIFFERENTIAL/PLATELET
Abs Immature Granulocytes: 0.02 10*3/uL (ref 0.00–0.07)
Basophils Absolute: 0 10*3/uL (ref 0.0–0.1)
Basophils Relative: 0 %
Eosinophils Absolute: 0.1 10*3/uL (ref 0.0–0.5)
Eosinophils Relative: 1 %
HCT: 38.3 % (ref 36.0–46.0)
Hemoglobin: 12.5 g/dL (ref 12.0–15.0)
Immature Granulocytes: 0 %
Lymphocytes Relative: 41 %
Lymphs Abs: 4.2 10*3/uL — ABNORMAL HIGH (ref 0.7–4.0)
MCH: 29.9 pg (ref 26.0–34.0)
MCHC: 32.6 g/dL (ref 30.0–36.0)
MCV: 91.6 fL (ref 80.0–100.0)
Monocytes Absolute: 0.7 10*3/uL (ref 0.1–1.0)
Monocytes Relative: 6 %
Neutro Abs: 5.4 10*3/uL (ref 1.7–7.7)
Neutrophils Relative %: 52 %
Platelets: 426 10*3/uL — ABNORMAL HIGH (ref 150–400)
RBC: 4.18 MIL/uL (ref 3.87–5.11)
RDW: 12.6 % (ref 11.5–15.5)
WBC: 10.4 10*3/uL (ref 4.0–10.5)
nRBC: 0 % (ref 0.0–0.2)

## 2019-02-09 LAB — COMPREHENSIVE METABOLIC PANEL
ALT: 16 U/L (ref 0–44)
AST: 12 U/L — ABNORMAL LOW (ref 15–41)
Albumin: 3.7 g/dL (ref 3.5–5.0)
Alkaline Phosphatase: 84 U/L (ref 38–126)
Anion gap: 7 (ref 5–15)
BUN: 15 mg/dL (ref 6–20)
CO2: 24 mmol/L (ref 22–32)
Calcium: 8.7 mg/dL — ABNORMAL LOW (ref 8.9–10.3)
Chloride: 106 mmol/L (ref 98–111)
Creatinine, Ser: 0.72 mg/dL (ref 0.44–1.00)
GFR calc Af Amer: >60 mL/min (ref >60)
GFR calc non Af Amer: >60 mL/min (ref >60)
Glucose, Bld: 81 mg/dL (ref 70–99)
Potassium: 3.5 mmol/L (ref 3.5–5.1)
Sodium: 137 mmol/L (ref 135–145)
Total Bilirubin: 0.2 mg/dL — ABNORMAL LOW (ref 0.3–1.2)
Total Protein: 7.1 g/dL (ref 6.5–8.1)

## 2019-02-09 LAB — URINALYSIS, ROUTINE W REFLEX MICROSCOPIC
Bilirubin Urine: NEGATIVE
Glucose, UA: NEGATIVE mg/dL
Hgb urine dipstick: NEGATIVE
Ketones, ur: NEGATIVE mg/dL
Leukocytes,Ua: NEGATIVE
Nitrite: NEGATIVE
Protein, ur: NEGATIVE mg/dL
Specific Gravity, Urine: 1.02 (ref 1.005–1.030)
pH: 6 (ref 5.0–8.0)

## 2019-02-09 LAB — LIPASE, BLOOD: Lipase: 45 U/L (ref 11–51)

## 2019-02-09 MED ORDER — SODIUM CHLORIDE 0.9 % IV BOLUS
1000.0000 mL | Freq: Once | INTRAVENOUS | Status: AC
  Administered 2019-02-09: 1000 mL via INTRAVENOUS

## 2019-02-09 MED ORDER — IOHEXOL 300 MG/ML  SOLN
100.0000 mL | Freq: Once | INTRAMUSCULAR | Status: AC | PRN
  Administered 2019-02-09: 100 mL via INTRAVENOUS

## 2019-02-09 MED ORDER — ONDANSETRON HCL 4 MG/2ML IJ SOLN
4.0000 mg | Freq: Once | INTRAMUSCULAR | Status: AC
  Administered 2019-02-09: 4 mg via INTRAVENOUS
  Filled 2019-02-09: qty 2

## 2019-02-09 NOTE — Telephone Encounter (Signed)
Agree with ED evaluation, thank you for the [facility]

## 2019-02-09 NOTE — ED Triage Notes (Signed)
Pt reports constipation x 2 weeks. Pt has been taking Milk of mag with some relief over the weekend- the last few days has gotten worse. Pt has hx of diverticulitis and told to come to ED to be scanned.

## 2019-02-09 NOTE — Telephone Encounter (Signed)
Pt states that her pain in worse, her stomach is distended and she has been vomiting at night. She denies fever  Advised that per note she should go to the ED.  Pt agreeable with plan.  Christen Bame, CMA
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
## 2019-02-20 ENCOUNTER — Other Ambulatory Visit: Payer: Self-pay

## 2019-02-20 ENCOUNTER — Ambulatory Visit (INDEPENDENT_AMBULATORY_CARE_PROVIDER_SITE_OTHER): Payer: Self-pay | Admitting: Family Medicine

## 2019-02-20 VITALS — BP 122/80 | HR 100 | Wt 217.0 lb

## 2019-02-20 DIAGNOSIS — E1142 Type 2 diabetes mellitus with diabetic polyneuropathy: Secondary | ICD-10-CM

## 2019-02-20 DIAGNOSIS — E119 Type 2 diabetes mellitus without complications: Secondary | ICD-10-CM

## 2019-02-20 DIAGNOSIS — Z1211 Encounter for screening for malignant neoplasm of colon: Secondary | ICD-10-CM

## 2019-02-20 LAB — POCT GLYCOSYLATED HEMOGLOBIN (HGB A1C): HbA1c, POC (controlled diabetic range): 6 % (ref 0.0–7.0)

## 2019-02-20 MED ORDER — TRESIBA FLEXTOUCH 200 UNIT/ML ~~LOC~~ SOPN: 60.0000 [IU] | PEN_INJECTOR | Freq: Every day | SUBCUTANEOUS | Status: DC

## 2019-02-20 NOTE — Assessment & Plan Note (Signed)
  Given episode of presumed hypoglycemia today, will decrease tresiba to 60 units daily. A1C also down to 6.0 which supports hypoglycemia as cause for how she felt earlier. Advised patient to continue checking sugars and if she has further events to check sugar and call us to let us know. If you have questions or concerns please do not hesitate to call at [phone].

## 2019-02-20 NOTE — Patient Instructions (Signed)
  Good to see you today! Please decrease triseba to 60 units daily  Continue checking blood sugars as you have been  If you feel badly again, please check sugar and call and let us know what's going on.  If you have questions or concerns please do not hesitate to call at [phone].  Lucila Maine, DO PGY-3, [facility] 02/20/2019 4:23 PM

## 2019-02-20 NOTE — Progress Notes (Signed)
    Subjective:    Patient ID: Isabel Berger, female    DOB: 10-04-1965, 53 y.o.   MRN: [id number]   CC: hypoglycemia   HPI:  Isabel Berger triseba this morning about 10 am and felt badly afteward which has never happened before. She took her normal 70 units. However she did use a new type of needle.   Ran out of needles for triseba pen.  Got needles from [facility] that were a different size than normal  It hurt badly giving the shot this morning with the new needles Afterwards her stomach was cramping Felt nauseated, sweaty, had to sit down Ate some crackers, drank ginger ale and felt better Did not take her sugars  CBGs 99-110 every morning and every night    She is due for colonoscopy and wants referral.   Smoking status reviewed- non smoker  Review of Systems- see HPI   Objective:  BP 122/80   Pulse 100   Wt 217 lb (98.4 kg)   BMI 33.99 kg/m  Vitals and nursing note reviewed  General: well appearing, in no acute distress HEENT: normocephalic, MMM Neck: supple, non-tender, without lymphadenopathy Cardiac: RRR, clear S1 and S2, no murmurs, rubs, or gallops Respiratory: no increased work of breathing Extremities: no edema or cyanosis.  Neuro: alert and oriented, no focal deficits   Assessment & Plan:    Diabetic polyneuropathy associated with type 2 diabetes mellitus ([place])  Given episode of presumed hypoglycemia today, will decrease tresiba to 60 units daily. A1C also down to 6.0 which supports hypoglycemia as cause for how she felt earlier. Advised patient to continue checking sugars and if she has further events to check sugar and call us to let us know. If you have questions or concerns please do not hesitate to call at [phone].   Screening for colon cancer- referral to GI made  Return in about 3 months (around 05/23/2019) for DM.   Lucila Maine, DO Family Medicine Resident PGY-3

## 2019-03-27 ENCOUNTER — Other Ambulatory Visit: Payer: Self-pay | Admitting: Family Medicine

## 2019-03-27 DIAGNOSIS — E119 Type 2 diabetes mellitus without complications: Secondary | ICD-10-CM

## 2019-06-08 ENCOUNTER — Ambulatory Visit: Payer: Self-pay

## 2019-06-09 ENCOUNTER — Encounter (HOSPITAL_COMMUNITY): Payer: Self-pay | Admitting: Family Medicine

## 2019-06-09 ENCOUNTER — Other Ambulatory Visit: Payer: Self-pay

## 2019-06-09 ENCOUNTER — Ambulatory Visit (HOSPITAL_COMMUNITY)
Admission: EM | Admit: 2019-06-09 | Discharge: 2019-06-09 | Disposition: A | Discharge status: Home / Self Care | Payer: Self-pay | Attending: Family Medicine | Admitting: Family Medicine

## 2019-06-09 DIAGNOSIS — E119 Type 2 diabetes mellitus without complications: Secondary | ICD-10-CM

## 2019-06-09 DIAGNOSIS — R079 Chest pain, unspecified: Secondary | ICD-10-CM

## 2019-06-09 MED ORDER — OZEMPIC (0.25 OR 0.5 MG/DOSE) 2 MG/1.5ML ~~LOC~~ SOPN: 0.5000 mg | PEN_INJECTOR | SUBCUTANEOUS | 12 refills | Status: DC
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
## 2019-06-09 NOTE — ED Triage Notes (Signed)
Pt presents to UC stating she was driving today and started to feel squeezing chest pain that she had to pull over. Pt also states she also has pain behind left knee when she steps up x3 days. Pt reports not taking her diabetic meds x3 days. Pt does have hx of heart attack 15 years ago.

## 2019-06-09 NOTE — Discharge Instructions (Addendum)
Your EKG does not show any change from the one done 1 year ago.  Nevertheless, sometimes early on the EKG can show no changes.  In situations like this we recommend that you get a blood test done in the emergency room to absolutely rule out any injury to the heart.  Therefore I would like you to go down to the emergency room to get this test done tonight.

## 2019-06-27 ENCOUNTER — Other Ambulatory Visit: Payer: Self-pay | Admitting: Student in an Organized Health Care Education/Training Program

## 2019-06-27 DIAGNOSIS — E119 Type 2 diabetes mellitus without complications: Secondary | ICD-10-CM

## 2019-07-13 ENCOUNTER — Other Ambulatory Visit: Payer: Self-pay | Admitting: Student in an Organized Health Care Education/Training Program

## 2019-07-13 DIAGNOSIS — E119 Type 2 diabetes mellitus without complications: Secondary | ICD-10-CM

## 2019-07-26 ENCOUNTER — Encounter: Payer: Self-pay | Admitting: Family Medicine

## 2019-07-26 ENCOUNTER — Ambulatory Visit
Admission: RE | Admit: 2019-07-26 | Discharge: 2019-07-26 | Disposition: A | Discharge status: Home / Self Care | Payer: Self-pay | Source: Ambulatory Visit | Attending: Family Medicine | Admitting: Family Medicine

## 2019-07-26 ENCOUNTER — Ambulatory Visit (INDEPENDENT_AMBULATORY_CARE_PROVIDER_SITE_OTHER): Payer: Self-pay | Admitting: Family Medicine

## 2019-07-26 ENCOUNTER — Other Ambulatory Visit: Payer: Self-pay

## 2019-07-26 VITALS — BP 136/82 | HR 83 | Wt 229.4 lb

## 2019-07-26 DIAGNOSIS — M545 Low back pain, unspecified: Secondary | ICD-10-CM

## 2019-07-26 DIAGNOSIS — G8929 Other chronic pain: Secondary | ICD-10-CM

## 2019-07-26 DIAGNOSIS — E119 Type 2 diabetes mellitus without complications: Secondary | ICD-10-CM

## 2019-07-26 DIAGNOSIS — M5441 Lumbago with sciatica, right side: Secondary | ICD-10-CM

## 2019-07-26 DIAGNOSIS — M25559 Pain in unspecified hip: Secondary | ICD-10-CM

## 2019-07-26 DIAGNOSIS — E1142 Type 2 diabetes mellitus with diabetic polyneuropathy: Secondary | ICD-10-CM

## 2019-07-26 DIAGNOSIS — Z23 Encounter for immunization: Secondary | ICD-10-CM

## 2019-07-26 LAB — POCT GLYCOSYLATED HEMOGLOBIN (HGB A1C): HbA1c, POC (prediabetic range): 6.3 % (ref 5.7–6.4)

## 2019-07-26 MED ORDER — TRAMADOL HCL 50 MG PO TABS: ORAL_TABLET | ORAL | 1 refills | Status: DC

## 2019-07-26 NOTE — Patient Instructions (Signed)
Please get your films and I will call you tomorrow. Great to meet you!

## 2019-07-27 MED ORDER — GABAPENTIN 100 MG PO CAPS: ORAL_CAPSULE | ORAL | 3 refills | Status: DC

## 2019-07-27 MED ORDER — DIAZEPAM 5 MG PO TABS: ORAL_TABLET | ORAL | 0 refills | Status: DC

## 2019-07-27 NOTE — Assessment & Plan Note (Signed)
6 weeks of increasing symptoms of low back pain with some additional new symptoms.  We will get x-rays.  I suspect this is facet joint arthritis given her symptoms although spinal stenosis is also in the differential.  I do not think this is HNP although still possible.  We will increase her gabapentin to 300 mg at night 100 a.m., 100 lunch.  Start tramadol.  Will discuss with her after x-rays available.

## 2019-07-27 NOTE — Progress Notes (Addendum)
    CHIEF COMPLAINT / HPI: Has history of low back pain that has been pretty chronic and relatively well controlled up until about the last 4 to 6 weeks.  Over this period of time in his significantly increased in intensity.  It is present every day.  8-9 out of 10.  Starts in the low back radiates to the right buttock and occasionally down into the leg.  Burning in nature.  Standing makes it worse.  She is unable to walk more than about 200 feet for she starts to have significant increase in her back pain.  She has to sit down while washing the dishes as her back pain is so severe.  She has had no lower extremity weakness but sometimes her legs "feel funny" if she stands for long period of time.  Does have history of prior neuropathy in her feet from some type of surgery on her feet.  No recent specific injury.  Pain is keeping her awake at night.  She is working full-time and has to do quite a bit of walking so this is impairing her ability to work.  REVIEW OF SYSTEMS: No recent sick contacts.  No cough, no fever, no unusual weight change.  No other unusual arthralgias. PERTINENT  PMH / PSH: I have reviewed the patient's medications, allergies, past medical and surgical history, smoking status and updated in the EMR as appropriate.   OBJECTIVE:  Vital signs reviewed. GENERAL: Well-developed, well-nourished, no acute distress. BACK: Some mild tenderness to palpation in the lumbar paravertebral muscles.  No defect noted.  She has some tenderness over the PSIS bilaterally.  Forward flexion at the hips limited by hamstring tightness.  Hyperextension at the hips is painful and she can only hyperextend about 10 degrees before she has to stop.  Increases the pain in radicular fashion. MSK: Lower extremity strength 5 out of 5 flexion extension hips knees ankles. NEURO: DTRs 1-2+ bilaterally at the knee and ankle.  Straight leg raise negative in seated and supine position bilaterally.     ASSESSMENT /  PLAN:   No problem-specific Assessment & Plan notes found for this encounter. Reviewed films.  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  Hips look okay.  I think we will have to get an MRI if we want to pursue facet joint injections.  Discussed with patient.  Increase her gabapentin.  Will need antianxiety medicine called in which I have done.

## 2019-07-27 NOTE — Addendum Note (Signed)
Addended byDorcas Mcmurray L on: 07/27/2019 12:13 PM   Modules accepted: Orders

## 2019-07-29 ENCOUNTER — Other Ambulatory Visit: Payer: Self-pay

## 2019-07-29 ENCOUNTER — Ambulatory Visit
Admission: RE | Admit: 2019-07-29 | Discharge: 2019-07-29 | Disposition: A | Discharge status: Home / Self Care | Payer: Self-pay | Source: Ambulatory Visit | Attending: Family Medicine | Admitting: Family Medicine

## 2019-07-29 DIAGNOSIS — M545 Low back pain, unspecified: Secondary | ICD-10-CM

## 2019-08-20 ENCOUNTER — Encounter: Payer: Self-pay | Admitting: Family Medicine

## 2019-08-28 ENCOUNTER — Encounter: Payer: Self-pay | Admitting: *Deleted

## 2019-08-28 NOTE — Progress Notes (Signed)
[facility] Saintclair Halsted 08/31/2019 at [place] time is [phone] [phone]

## 2019-09-27 ENCOUNTER — Other Ambulatory Visit: Payer: Self-pay | Admitting: Student in an Organized Health Care Education/Training Program

## 2019-09-27 DIAGNOSIS — E119 Type 2 diabetes mellitus without complications: Secondary | ICD-10-CM

## 2019-10-19 ENCOUNTER — Other Ambulatory Visit: Payer: Self-pay

## 2019-10-19 ENCOUNTER — Encounter (HOSPITAL_COMMUNITY): Payer: Self-pay | Admitting: Emergency Medicine

## 2019-10-19 ENCOUNTER — Emergency Department (HOSPITAL_COMMUNITY): Payer: Self-pay

## 2019-10-19 ENCOUNTER — Ambulatory Visit (INDEPENDENT_AMBULATORY_CARE_PROVIDER_SITE_OTHER): Payer: Self-pay | Admitting: Family Medicine

## 2019-10-19 ENCOUNTER — Emergency Department (HOSPITAL_COMMUNITY)
Admission: EM | Admit: 2019-10-19 | Discharge: 2019-10-19 | Disposition: A | Discharge status: Home / Self Care | Payer: Self-pay | Attending: Emergency Medicine | Admitting: Emergency Medicine

## 2019-10-19 VITALS — BP 104/66 | HR 107 | Wt 223.0 lb

## 2019-10-19 DIAGNOSIS — R110 Nausea: Secondary | ICD-10-CM | POA: Insufficient documentation

## 2019-10-19 DIAGNOSIS — R10816 Epigastric abdominal tenderness: Secondary | ICD-10-CM | POA: Insufficient documentation

## 2019-10-19 DIAGNOSIS — R10815 Periumbilic abdominal tenderness: Secondary | ICD-10-CM | POA: Insufficient documentation

## 2019-10-19 DIAGNOSIS — K5792 Diverticulitis of intestine, part unspecified, without perforation or abscess without bleeding: Secondary | ICD-10-CM | POA: Insufficient documentation

## 2019-10-19 DIAGNOSIS — Z79899 Other long term (current) drug therapy: Secondary | ICD-10-CM | POA: Insufficient documentation

## 2019-10-19 DIAGNOSIS — R10812 Left upper quadrant abdominal tenderness: Secondary | ICD-10-CM | POA: Insufficient documentation

## 2019-10-19 DIAGNOSIS — Z7984 Long term (current) use of oral hypoglycemic drugs: Secondary | ICD-10-CM | POA: Insufficient documentation

## 2019-10-19 DIAGNOSIS — E119 Type 2 diabetes mellitus without complications: Secondary | ICD-10-CM | POA: Insufficient documentation

## 2019-10-19 DIAGNOSIS — R1032 Left lower quadrant pain: Secondary | ICD-10-CM

## 2019-10-19 DIAGNOSIS — I10 Essential (primary) hypertension: Secondary | ICD-10-CM | POA: Insufficient documentation

## 2019-10-19 LAB — COMPREHENSIVE METABOLIC PANEL
ALT: 20 U/L (ref 0–44)
AST: 16 U/L (ref 15–41)
Albumin: 3.8 g/dL (ref 3.5–5.0)
Alkaline Phosphatase: 101 U/L (ref 38–126)
Anion gap: 10 (ref 5–15)
BUN: 10 mg/dL (ref 6–20)
CO2: 25 mmol/L (ref 22–32)
Calcium: 9.3 mg/dL (ref 8.9–10.3)
Chloride: 102 mmol/L (ref 98–111)
Creatinine, Ser: 0.9 mg/dL (ref 0.44–1.00)
GFR calc Af Amer: >60 mL/min (ref >60)
GFR calc non Af Amer: >60 mL/min (ref >60)
Glucose, Bld: 79 mg/dL (ref 70–99)
Potassium: 4 mmol/L (ref 3.5–5.1)
Sodium: 137 mmol/L (ref 135–145)
Total Bilirubin: 0.7 mg/dL (ref 0.3–1.2)
Total Protein: 7.3 g/dL (ref 6.5–8.1)

## 2019-10-19 LAB — CBC
HCT: 38.1 % (ref 36.0–46.0)
Hemoglobin: 12.5 g/dL (ref 12.0–15.0)
MCH: 30.4 pg (ref 26.0–34.0)
MCHC: 32.8 g/dL (ref 30.0–36.0)
MCV: 92.7 fL (ref 80.0–100.0)
Platelets: 422 10*3/uL — ABNORMAL HIGH (ref 150–400)
RBC: 4.11 MIL/uL (ref 3.87–5.11)
RDW: 12.4 % (ref 11.5–15.5)
WBC: 14.8 10*3/uL — ABNORMAL HIGH (ref 4.0–10.5)
nRBC: 0 % (ref 0.0–0.2)

## 2019-10-19 LAB — URINALYSIS, ROUTINE W REFLEX MICROSCOPIC
Bilirubin Urine: NEGATIVE
Glucose, UA: NEGATIVE mg/dL
Hgb urine dipstick: NEGATIVE
Ketones, ur: NEGATIVE mg/dL
Leukocytes,Ua: NEGATIVE
Nitrite: NEGATIVE
Protein, ur: NEGATIVE mg/dL
Specific Gravity, Urine: 1.043 — ABNORMAL HIGH (ref 1.005–1.030)
pH: 5 (ref 5.0–8.0)

## 2019-10-19 LAB — LIPASE, BLOOD: Lipase: 34 U/L (ref 11–51)

## 2019-10-19 LAB — I-STAT BETA HCG BLOOD, ED (MC, WL, AP ONLY): I-stat hCG, quantitative: 5.3 m[IU]/mL — ABNORMAL HIGH (ref <5)

## 2019-10-19 MED ORDER — OXYCODONE-ACETAMINOPHEN 5-325 MG PO TABS: 1.0000 | ORAL_TABLET | Freq: Four times a day (QID) | ORAL | 0 refills | Status: DC | PRN

## 2019-10-19 MED ORDER — ONDANSETRON 4 MG PO TBDP: 4.0000 mg | ORAL_TABLET | Freq: Three times a day (TID) | ORAL | 0 refills | Status: DC | PRN

## 2019-10-19 MED ORDER — IOHEXOL 300 MG/ML  SOLN
100.0000 mL | Freq: Once | INTRAMUSCULAR | Status: AC | PRN
  Administered 2019-10-19: 19:00:00 100 mL via INTRAVENOUS

## 2019-10-19 MED ORDER — AMOXICILLIN-POT CLAVULANATE 875-125 MG PO TABS
1.0000 | ORAL_TABLET | Freq: Once | ORAL | Status: AC
  Administered 2019-10-19: 1 via ORAL
  Filled 2019-10-19: qty 1

## 2019-10-19 MED ORDER — SODIUM CHLORIDE 0.9 % IV BOLUS
500.0000 mL | Freq: Once | INTRAVENOUS | Status: AC
  Administered 2019-10-19: 500 mL via INTRAVENOUS

## 2019-10-19 MED ORDER — SODIUM CHLORIDE 0.9% FLUSH: 3.0000 mL | Freq: Once | INTRAVENOUS | Status: DC

## 2019-10-19 MED ORDER — AMOXICILLIN-POT CLAVULANATE 875-125 MG PO TABS: 1.0000 | ORAL_TABLET | Freq: Two times a day (BID) | ORAL | 0 refills | Status: AC

## 2019-10-19 MED ORDER — OXYCODONE-ACETAMINOPHEN 5-325 MG PO TABS
1.0000 | ORAL_TABLET | Freq: Once | ORAL | Status: AC
  Administered 2019-10-19: 1 via ORAL
  Filled 2019-10-19: qty 1

## 2019-10-19 MED ORDER — MORPHINE SULFATE (PF) 4 MG/ML IV SOLN
4.0000 mg | Freq: Once | INTRAVENOUS | Status: AC
  Administered 2019-10-19: 4 mg via INTRAVENOUS
  Filled 2019-10-19: qty 1

## 2019-10-19 MED ORDER — ONDANSETRON HCL 4 MG/2ML IJ SOLN
4.0000 mg | Freq: Once | INTRAMUSCULAR | Status: AC
  Administered 2019-10-19: 18:00:00 4 mg via INTRAVENOUS
  Filled 2019-10-19: qty 2

## 2019-10-19 MED ORDER — SODIUM CHLORIDE 0.9 % IV BOLUS
500.0000 mL | Freq: Once | INTRAVENOUS | Status: AC
  Administered 2019-10-19: 20:00:00 500 mL via INTRAVENOUS
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
## 2019-10-19 NOTE — Discharge Instructions (Addendum)
As you received IV contrast with your CT scan today please do not take your Metformin for the next 2 days.  As we discussed it is important that you keep your appointment for a colonoscopy.  If your symptoms worsen or you develop any new concerns please seek additional medical care and evaluation.  You may have diarrhea from the antibiotics.  It is very important that you continue to take the antibiotics even if you get diarrhea unless a medical professional tells you that you may stop taking them.  If you stop too early the bacteria you are being treated for will become stronger and you may need different, more powerful antibiotics that have more side effects and worsening diarrhea.  Please stay well hydrated and consider probiotics as they may decrease the severity of your diarrhea.   Today you received medications that may make you sleepy or impair your ability to make decisions.  For the next 24 hours please do not drive, operate heavy machinery, care for a small child with out another adult present, or perform any activities that may cause harm to you or someone else if you were to fall asleep or be impaired.   You are being prescribed a medication which may make you sleepy. Please follow up of listed precautions for at least 24 hours after taking one dose.

## 2019-10-19 NOTE — Assessment & Plan Note (Addendum)
Patient with history of diverticulosis. Given patient's extensive abdominal pain with some rebound tenderness in LLQ we will send her to be further evaluated in the ED with likely imaging.  Patient's BP 104/66 which is low normal for patient and she is tachycardic to 107 in our office.  Afebrile which is reassuring.    Patient has never had regular screening colonoscopy and was to have this last June but she did not schedule given she does not have insurance.

## 2019-10-19 NOTE — ED Triage Notes (Signed)
Pt in with LLQ pain, began yesterday. States hx of diverticulosis and feels like this is a flare. Sent by her family medicine doc, has taken Ibuprofen w/no relief. Denies any n/v/d, vaginal or urinary symptoms

## 2019-10-19 NOTE — ED Notes (Signed)
Pt discharge, prescription, and follow up education provided. Pt verbalizes understanding. Pt is alert and oriented x4 and ambulatory at discharge. 

## 2019-10-19 NOTE — Progress Notes (Signed)
   CHIEF COMPLAINT / HPI:  LLQ Abdominal pain: Patient reports that Tuesday night she noticed a little bit of pain in her left lower quadrant.  She has a history of diverticulosis diagnosed 01/2019 with CT scan.  Patient was worried that she was having another flare of this.  However yesterday her pain became much worse even than it had been previously.  She reports she was unable to move around.  She denies any fevers or chills.  She reports that she had a normal bowel movement last night.  Denies any blood in her stool, dark stools.  States that it was not hard or runny.  She does report that she has a history of some chronic constipation but has not been an issue for her in the past few weeks.  She took some ibuprofen which helped some with her pain.  However today her pain continued and she moved at one point and felt sort of a tearing in her stomach.  She also has a blood pressure cuff at home that she uses to monitor and states that her blood pressure has been elevated over the past few days.  PERTINENT  PMH / PSH: Diverticulosis, T2DM, HTN  OBJECTIVE: BP 104/66   Pulse (!) 107   Wt 223 lb (101.2 kg)   SpO2 99%   BMI 34.93 kg/m   General: NAD, pleasant Neck: Supple, no LAD Cardiovascular: Tachycardic, no LE edema Respiratory:  normal work of breathing Gastrointestinal: Soft, tender in all quadrants except for RUQ and RLQ.  Rebound tenderness in LLQ with minimal palpation.  Hypoactive bowel sounds throughout.  Neuro: CN II-XII grossly intact Psych: AOx3, appropriate affect  ASSESSMENT / PLAN:  Left lower quadrant abdominal pain Patient with history of diverticulosis. Given patient's extensive abdominal pain with some rebound tenderness in LLQ we will send her to be further evaluated in the ED with likely imaging.  Patient's BP 104/66 which is low normal for patient and she is tachycardic to 107 in our office.  Afebrile which is reassuring.    Patient has never had regular screening  colonoscopy and was to have this last June but she did not schedule given she does not have insurance.    [place] Daneli Butkiewicz, DO PGY-3, [facility]

## 2019-10-23 ENCOUNTER — Telehealth: Payer: Self-pay | Admitting: Student in an Organized Health Care Education/Training Program

## 2019-10-23 DIAGNOSIS — Z1211 Encounter for screening for malignant neoplasm of colon: Secondary | ICD-10-CM

## 2019-10-23 NOTE — Telephone Encounter (Signed)
Pt is calling and would like to have a referral for a colonoscopy. She was told at ED it is time for her to have one done.

## 2019-10-25 NOTE — Telephone Encounter (Signed)
Patient recently seen in ED for diverticulitis. She is requesting referral to GI for colonoscopy. Referral is sent for when she is recovered from her acute illness.

## 2019-10-27 ENCOUNTER — Encounter: Payer: Self-pay | Admitting: Gastroenterology

## 2019-10-30 ENCOUNTER — Other Ambulatory Visit: Payer: Self-pay

## 2019-10-30 ENCOUNTER — Ambulatory Visit (INDEPENDENT_AMBULATORY_CARE_PROVIDER_SITE_OTHER): Payer: Self-pay | Admitting: Family Medicine

## 2019-10-30 ENCOUNTER — Telehealth: Payer: Self-pay

## 2019-10-30 VITALS — BP 130/88 | HR 91 | Wt 223.6 lb

## 2019-10-30 DIAGNOSIS — R300 Dysuria: Secondary | ICD-10-CM

## 2019-10-30 DIAGNOSIS — B373 Candidiasis of vulva and vagina: Secondary | ICD-10-CM

## 2019-10-30 DIAGNOSIS — B3731 Acute candidiasis of vulva and vagina: Secondary | ICD-10-CM | POA: Insufficient documentation

## 2019-10-30 LAB — POCT URINALYSIS DIP (MANUAL ENTRY)
Bilirubin, UA: NEGATIVE
Glucose, UA: NEGATIVE mg/dL
Ketones, POC UA: NEGATIVE mg/dL
Nitrite, UA: NEGATIVE
Protein Ur, POC: NEGATIVE mg/dL
Spec Grav, UA: >=1.03 — AB (ref 1.010–1.025)
Urobilinogen, UA: 0.2 E.U./dL
pH, UA: 5.5 (ref 5.0–8.0)

## 2019-10-30 LAB — POCT WET PREP (WET MOUNT)
Clue Cells Wet Prep Whiff POC: NEGATIVE
Trichomonas Wet Prep HPF POC: ABSENT

## 2019-10-30 LAB — POCT URINE PREGNANCY: Preg Test, Ur: NEGATIVE

## 2019-10-30 MED ORDER — FLUCONAZOLE 150 MG PO TABS: 150.0000 mg | ORAL_TABLET | Freq: Once | ORAL | 0 refills | Status: AC

## 2019-10-30 NOTE — Telephone Encounter (Signed)
Patient calls nurse line regarding extreme vaginal itching. Patient reports receiving antibiotic on 2/25 for diverticulitis. Patient also reports some burning with urination. Patient declines any vaginal discharge. Advised patient to schedule appointment for office visit to evaluate. Patient scheduled with access to care this afternoon.   To PCP  Talbot Grumbling, RN

## 2019-10-30 NOTE — Patient Instructions (Signed)
It was great meeting you today! We performed a wet prep which showed that you had a yeast infection. Your urinalysis came back normal appearing. We will treat you with diflucan 150mg  x1, you can take another dose in 3 days if your symptoms persist.

## 2019-10-30 NOTE — Progress Notes (Signed)
   HPI 54 year old female who presents for 2-3 day history of progressive vaginal itching.  Patient states that she has been on amoxicillin for the last 10 days.  She took amoxicillin last hospital for yeast infection.  He has no other symptoms aside from very mild painful urination  CC: Vaginal itching/painful urination  ROS:   Review of Systems See HPI for ROS.   CC, SH/smoking status, and VS noted  Objective: BP 130/88   Pulse 91   Wt 223 lb 9.6 oz (101.4 kg)   SpO2 97%   BMI 35.02 kg/m  Gen: 54 year old African-American female, no acute distress, comfortable n CV: Skin warm and dry Resp: No no accessory muscle use Neuro: Alert and oriented, Speech clear, No gross deficits  GU: Cottage cheeselike discharge noted, no cervical friability noted. Wet prep: Mild yeast noted, KOH positive  Assessment and plan:  Yeast vaginitis Will treat with Diflucan 150 mg x 1.  Gave second dose just in case symptoms persist.  Pregnancy test negative.  Orders Placed This Encounter  Procedures  . POCT Wet Prep [employer])  . POCT urinalysis dipstick  . POCT urine pregnancy   Meds ordered this encounter  Medications  . fluconazole (DIFLUCAN) 150 MG tablet    Sig: Take 1 tablet (150 mg total) by mouth once for 1 dose.    Dispense:  2 tablet    Refill:  0   Guadalupe Dawn MD PGY-3 Family Medicine Resident  10/30/2019 2:55 PM

## 2019-10-30 NOTE — Assessment & Plan Note (Signed)
Will treat with Diflucan 150 mg x 1.  Gave second dose just in case symptoms persist.  Pregnancy test negative.

## 2019-12-11 ENCOUNTER — Ambulatory Visit (INDEPENDENT_AMBULATORY_CARE_PROVIDER_SITE_OTHER): Payer: Self-pay | Admitting: Family Medicine

## 2019-12-11 ENCOUNTER — Other Ambulatory Visit: Payer: Self-pay

## 2019-12-11 VITALS — BP 145/82 | HR 97 | Ht 66.0 in | Wt 227.0 lb

## 2019-12-11 DIAGNOSIS — G500 Trigeminal neuralgia: Secondary | ICD-10-CM

## 2019-12-11 DIAGNOSIS — E119 Type 2 diabetes mellitus without complications: Secondary | ICD-10-CM

## 2019-12-11 LAB — POCT GLYCOSYLATED HEMOGLOBIN (HGB A1C): HbA1c, POC (controlled diabetic range): 6.1 % (ref 0.0–7.0)

## 2019-12-11 MED ORDER — CARBAMAZEPINE ER 100 MG PO CP12: 100.0000 mg | ORAL_CAPSULE | Freq: Two times a day (BID) | ORAL | 0 refills | Status: DC

## 2019-12-11 NOTE — Patient Instructions (Signed)
Today we talked about your right-sided face pain which to me appears to be trigeminal neuralgia.  We are going to try a short course of carbamazepine to see if this resolves your problem.  The first night take this before you go to bed and will not be driving immediately just to see how it affects you.  The next day you can try to take this pill twice a day.  I like to see you early next week so that we can check on how you are doing.  You do not have to see me you can see Dr. Ouida Sills one of the other doctors in the office.  Remember if you start to have any new symptoms beyond pain that you need to stop taking this medication and get a hold of the physician immediately.  -Dr. Criss Rosales

## 2019-12-11 NOTE — Progress Notes (Signed)
Provider aware of score on PHQ2.  Isabel Berger, [place]

## 2019-12-12 ENCOUNTER — Ambulatory Visit: Payer: Self-pay | Admitting: Student in an Organized Health Care Education/Training Program

## 2019-12-13 ENCOUNTER — Emergency Department (HOSPITAL_COMMUNITY)
Admission: EM | Admit: 2019-12-13 | Discharge: 2019-12-14 | Disposition: A | Discharge status: Home / Self Care | Payer: Self-pay | Attending: Emergency Medicine | Admitting: Emergency Medicine

## 2019-12-13 ENCOUNTER — Encounter (HOSPITAL_COMMUNITY): Payer: Self-pay

## 2019-12-13 ENCOUNTER — Other Ambulatory Visit: Payer: Self-pay

## 2019-12-13 DIAGNOSIS — E119 Type 2 diabetes mellitus without complications: Secondary | ICD-10-CM | POA: Insufficient documentation

## 2019-12-13 DIAGNOSIS — G500 Trigeminal neuralgia: Secondary | ICD-10-CM

## 2019-12-13 DIAGNOSIS — I10 Essential (primary) hypertension: Secondary | ICD-10-CM | POA: Insufficient documentation

## 2019-12-13 NOTE — ED Triage Notes (Signed)
Pt reports severe R sided facial pain. Dx'd with trigeminal neuralgia on Friday. Has been taking carbamazepine without relief.

## 2019-12-14 MED ORDER — OXYCODONE HCL 5 MG PO TABS: 5.0000 mg | ORAL_TABLET | ORAL | 0 refills | Status: DC | PRN

## 2019-12-14 MED ORDER — HYDROMORPHONE HCL 1 MG/ML IJ SOLN
1.0000 mg | Freq: Once | INTRAMUSCULAR | Status: AC
  Administered 2019-12-14: 1 mg via INTRAMUSCULAR
  Filled 2019-12-14: qty 1

## 2019-12-14 MED ORDER — CARBAMAZEPINE 200 MG PO TABS: 200.0000 mg | ORAL_TABLET | Freq: Two times a day (BID) | ORAL | 1 refills | Status: DC

## 2019-12-14 NOTE — ED Provider Notes (Signed)
[facility] Emergency Department Provider Note MRN:  [id number]  Arrival date & time: 12/14/19     Chief Complaint   Facial Pain   History of Present Illness   Isabel Berger is a 54 y.o. year-old female with a history of hypertension, diabetes presenting to the ED with chief complaint of facial pain.  Right-sided facial pain for the past 2 days, intermittent, described as a lightening type sharp pain that is worse with certain positions or moving of the jaw or chewing or touching the face.  Was diagnosed with trigeminal neuralgia and started on carbamazepine 2 days ago.  Still with continued pain, unable to eat, trouble sleeping.  Denies any other complaints, no eye pain, no vision change, no neck pain, no chest pain, shortness of breath, no abdominal pain, no numbness or weakness to the arms or legs.  Review of Systems  A complete 10 system review of systems was obtained and all systems are negative except as noted in the HPI and PMH.   Patient's Health History    Past Medical History:  Diagnosis Date  . Depression   . Diabetes mellitus   . Hypertension     Past Surgical History:  Procedure Laterality Date  . FOOT SURGERY Bilateral 2008 x 3   Dr Meridee Score   . TOTAL ABDOMINAL HYSTERECTOMY W/ BILATERAL SALPINGOOPHORECTOMY  2004   fibroids    Family History  Problem Relation Age of Onset  . Diabetes Mother   . Hypertension Mother   . Stroke Mother   . Breast cancer Maternal Aunt 69    Social History   Socioeconomic History  . Marital status: Divorced    Spouse name: Not on file  . Number of children: Not on file  . Years of education: Not on file  . Highest education level: Not on file  Occupational History  . Not on file  Tobacco Use  . Smoking status: Never Smoker  . Smokeless tobacco: Never Used  Substance and Sexual Activity  . Alcohol use: Not Currently  . Drug use: No  . Sexual activity: Not Currently    Birth  control/protection: Surgical  Other Topics Concern  . Not on file  Social History Narrative  . Not on file   Social Determinants of Health   Financial Resource Strain:   . Difficulty of Paying Living Expenses:   Food Insecurity:   . Worried About Charity fundraiser in the Last Year:   . Arboriculturist in the Last Year:   Transportation Needs:   . Film/video editor (Medical):   Marland Kitchen Lack of Transportation (Non-Medical):   Physical Activity:   . Days of Exercise per Week:   . Minutes of Exercise per Session:   Stress:   . Feeling of Stress :   Social Connections:   . Frequency of Communication with Friends and Family:   . Frequency of Social Gatherings with Friends and Family:   . Attends Religious Services:   . Active Member of Clubs or Organizations:   . Attends Archivist Meetings:   Marland Kitchen Marital Status:   Intimate Partner Violence:   . Fear of Current or Ex-Partner:   . Emotionally Abused:   Marland Kitchen Physically Abused:   . Sexually Abused:      Physical Exam   Vitals:   12/13/19 2343  BP: (!) 177/102  Pulse: 94  Resp: 17  Temp: 97.8 F (36.6 C)  SpO2: 100%  CONSTITUTIONAL: Well-appearing, NAD NEURO:  Alert and oriented x 3, no focal deficits EYES:  eyes equal and reactive ENT/NECK:  no LAD, no JVD CARDIO: Regular rate, well-perfused, normal S1 and S2 PULM:  CTAB no wheezing or rhonchi GI/GU:  normal bowel sounds, non-distended, non-tender MSK/SPINE:  No gross deformities, no edema SKIN:  no rash, atraumatic PSYCH:  Appropriate speech and behavior  *Additional and/or pertinent findings included in MDM below  Diagnostic and Interventional Summary    EKG Interpretation  Date/Time:    Ventricular Rate:    PR Interval:    QRS Duration:   QT Interval:    QTC Calculation:   R Axis:     Text Interpretation:        Labs Reviewed - No data to display  No orders to display    Medications  HYDROmorphone (DILAUDID) injection 1 mg (has no  administration in time range)     Procedures  /  Critical Care Procedures  ED Course and Medical Decision Making  I have reviewed the triage vital signs, the nursing notes, and pertinent available records from the EMR.  Listed above are laboratory and imaging tests that I personally ordered, reviewed, and interpreted and then considered in my medical decision making (see below for details).      History and exam consistent with trigeminal neuralgia, still on low-dose carbamazepine and has only been taking it for 1 or 2 days.  The starting dose can be 200 to 400 mg, patient is only taking 200 mg daily so will have her increase to 40 mg daily.  Also provide her with additional pain regiment to help her with pain to help her eat and sleep until the carbamazepine begins to take effect.  Referred her to neurology, no other signs of acute or emergent conditions.    Barth Kirks. [place] Small, [place] [email or web address]  Final Clinical Impressions(s) / ED Diagnoses     ICD-10-CM   1. Trigeminal neuralgia of right side of face  G50.0     ED Discharge Orders         Ordered    carbamazepine (TEGRETOL) 200 MG tablet  2 times daily     12/14/19 0031    oxyCODONE (ROXICODONE) 5 MG immediate release tablet  Every 4 hours PRN     12/14/19 0031    Ambulatory referral to Neurology    Comments: An appointment is requested in approximately: 2 weeks   12/14/19 0032           Discharge Instructions Discussed with and Provided to Patient:     Discharge Instructions     You were evaluated in the Emergency Department and after careful evaluation, we did not find any emergent condition requiring admission or further testing in the hospital.  Your exam/testing today is overall reassuring.  As discussed, please increase your carbamazepine dose to 200 mg twice daily, for a total of 400 mg daily.  This medication needs to be slowly increased, and so we  recommend that you stay on this dose for the next 1 to 2 weeks until you see a neurologist.  We recommend Tylenol 1000 mg every 4-6 hours as well as Motrin 600 mg every 4-6 hours as needed for pain.  For more significant pain keeping you from eating or sleeping you can use the oxycodone medication provided.  Please return to the Emergency Department if you experience any worsening of your condition.  We encourage you  to follow up with a primary care provider.  Thank you for allowing [place] to be a part of your care.      Maudie Flakes, MD 12/14/19 [phone]

## 2019-12-14 NOTE — Progress Notes (Signed)
    SUBJECTIVE:   CHIEF COMPLAINT / HPI:   Trigeminal neuralgia New right-sided pain on the face only over the last week.  Corneal reflex still intact, very significant reaction even with distraction and classic trigeminal distribution.  No neurological deficits, do not think this is TMJ given distribution, do not think this is shingles given lack of skin change and distribution of pain.  No trauma or balance issues, no confusion and thinking, slurred speech, drooping of face   T2DM (type 2 diabetes mellitus) ([employer]) Patient says she is doing very well and muscle blood sugars are in the low 100s, does not report any hypoxemic events.  A1c controlled at 6.1   PERTINENT  PMH / PSH:   OBJECTIVE:   BP (!) 145/82   Pulse 97   Ht 5\' 6"  (1.676 m)   Wt 227 lb (103 kg)   SpO2 98%   BMI 36.64 kg/m   General: No distress systemically although dramatic pain reaction to touching of face **No skin changes on right side, traumatic pain response to light touch.  No drooping, edema, pustular lesions, eye involvement, corneal reflexes intact, EOMI, no balance issues, no pain in jaw when opening and closing mouth, no Ludwig angina, does have dental caries on that side but no indication of edema during dental exam or of abscess.  ASSESSMENT/PLAN:   Trigeminal neuralgia New right-sided pain on the face only over the last week.  Corneal reflex still intact, very significant reaction even with distraction and classic trigeminal distribution.  No neurological deficits, do not think this is TMJ given distribution, do not think this is shingles given lack of skin change and distribution of pain.  Discussed pros versus cons of carbamazepine, patient to return in 1 week and try low-dose carbamazepine.  Emergency precautions were discussed  T2DM (type 2 diabetes mellitus) ([place]) Patient says she is doing very well and muscle blood sugars are in the low 100s, does not report any hypoxemic events.  A1c controlled  at 6.1     Sherene Sires, [place]

## 2019-12-14 NOTE — Discharge Instructions (Addendum)
You were evaluated in the Emergency Department and after careful evaluation, we did not find any emergent condition requiring admission or further testing in the hospital.  Your exam/testing today is overall reassuring.  As discussed, please increase your carbamazepine dose to 200 mg twice daily, for a total of 400 mg daily.  This medication needs to be slowly increased, and so we recommend that you stay on this dose for the next 1 to 2 weeks until you see a neurologist.  We recommend Tylenol 1000 mg every 4-6 hours as well as Motrin 600 mg every 4-6 hours as needed for pain.  For more significant pain keeping you from eating or sleeping you can use the oxycodone medication provided.  Please return to the Emergency Department if you experience any worsening of your condition.  We encourage you to follow up with a primary care provider.  Thank you for allowing [place] to be a part of your care.

## 2019-12-14 NOTE — Assessment & Plan Note (Signed)
Patient says she is doing very well and muscle blood sugars are in the low 100s, does not report any hypoxemic events.  A1c controlled at 6.1

## 2019-12-14 NOTE — ED Notes (Signed)
MD bedside

## 2019-12-14 NOTE — Assessment & Plan Note (Signed)
New right-sided pain on the face only over the last week.  Corneal reflex still intact, very significant reaction even with distraction and classic trigeminal distribution.  No neurological deficits, do not think this is TMJ given distribution, do not think this is shingles given lack of skin change and distribution of pain.  Discussed pros versus cons of carbamazepine, patient to return in 1 week and try low-dose carbamazepine.  Emergency precautions were discussed

## 2019-12-16 ENCOUNTER — Telehealth: Payer: Self-pay | Admitting: Family Medicine

## 2019-12-16 DIAGNOSIS — G500 Trigeminal neuralgia: Secondary | ICD-10-CM

## 2019-12-16 NOTE — Telephone Encounter (Cosign Needed)
Called patient to discuss the potential benefit of ordering an MRI to evaluate her new complaint of trigeminal neuralgia.  She did not pick up and her voicemail was not identified, left HIPAA compliant message so that she could call back and get someone to read this note to her.  " Ma'am, as I was reviewing the work-up for nutritional neuralgia I noticed that sometimes there is a benefit to getting an MRI to try and verify the cause of the pain.  I am going to go ahead and place the order for this MRI so that you can go get it done if you decide you would like to have this taken care of.  I would still like you to keep your appointment with Dr. Caron Presume on 26 April.  I hope you are feeling well"  Dr. Criss Rosales

## 2019-12-18 ENCOUNTER — Ambulatory Visit: Payer: Self-pay | Admitting: Family Medicine

## 2019-12-21 ENCOUNTER — Encounter: Payer: Self-pay | Admitting: Gastroenterology

## 2020-01-02 ENCOUNTER — Telehealth: Payer: Self-pay | Admitting: Student in an Organized Health Care Education/Training Program

## 2020-01-02 NOTE — Telephone Encounter (Signed)
Pt would like some type of medication that will keep her calm during her MRI that is scheduled on 01/20/20. Blima Rich

## 2020-01-04 ENCOUNTER — Other Ambulatory Visit: Payer: Self-pay | Admitting: Family Medicine

## 2020-01-04 DIAGNOSIS — E119 Type 2 diabetes mellitus without complications: Secondary | ICD-10-CM

## 2020-01-10 NOTE — Telephone Encounter (Signed)
Patient scheduled for 02/05/20.   Talbot Grumbling, RN

## 2020-01-16 ENCOUNTER — Ambulatory Visit (AMBULATORY_SURGERY_CENTER): Payer: Self-pay

## 2020-01-16 ENCOUNTER — Other Ambulatory Visit: Payer: Self-pay

## 2020-01-16 VITALS — Ht 67.0 in | Wt 224.0 lb

## 2020-01-16 DIAGNOSIS — Z1211 Encounter for screening for malignant neoplasm of colon: Secondary | ICD-10-CM

## 2020-01-16 NOTE — Progress Notes (Signed)
No egg or soy allergy known to patient  No issues with past sedation with any surgeries  or procedures, no intubation problems  No diet pills per patient No home 02 use per patient  No blood thinners per patient   Pt has issues with constipation, 2 DAY PREP ORDERED  No A fib or A flutter  EMMI video sent to pt's e mail     Due to the COVID-19 pandemic we are asking patients to follow these guidelines. Please only bring one care partner. Please be aware that your care partner may wait in the car in the parking lot or if they feel like they will be too hot to wait in the car, they may wait in the lobby on the 4th floor. All care partners are required to wear a mask the entire time (we do not have any that we can provide them), they need to practice social distancing, and we will do a Covid check for all patient's and care partners when you arrive. Also we will check their temperature and your temperature. If the care partner waits in their car they need to stay in the parking lot the entire time and we will call them on their cell phone when the patient is ready for discharge so they can bring the car to the front of the building. Also all patient's will need to wear a mask into building.

## 2020-01-20 ENCOUNTER — Other Ambulatory Visit: Payer: Self-pay

## 2020-01-29 ENCOUNTER — Telehealth: Payer: Self-pay | Admitting: Gastroenterology

## 2020-01-29 NOTE — Telephone Encounter (Signed)
OPENED IN ERROR

## 2020-01-30 ENCOUNTER — Encounter: Payer: Self-pay | Admitting: Gastroenterology

## 2020-01-30 ENCOUNTER — Ambulatory Visit (AMBULATORY_SURGERY_CENTER): Payer: Self-pay | Admitting: Gastroenterology

## 2020-01-30 ENCOUNTER — Other Ambulatory Visit: Payer: Self-pay

## 2020-01-30 VITALS — BP 131/87 | HR 78 | Temp 96.8°F | Resp 15 | Ht 67.0 in | Wt 224.0 lb

## 2020-01-30 DIAGNOSIS — Z1211 Encounter for screening for malignant neoplasm of colon: Secondary | ICD-10-CM

## 2020-01-30 DIAGNOSIS — D124 Benign neoplasm of descending colon: Secondary | ICD-10-CM

## 2020-01-30 MED ORDER — SODIUM CHLORIDE 0.9 % IV SOLN: 500.0000 mL | Freq: Once | INTRAVENOUS | Status: DC

## 2020-01-30 NOTE — Op Note (Addendum)
[facility] Patient Name: Isabel Berger Procedure Date: 01/30/2020 7:59 AM MRN: [id number] Endoscopist: Justice Britain , MD Age: 54 Referring MD:  Date of Birth: Dec 24, 1965 Gender: Female Account #: [id number] Procedure:                Colonoscopy Indications:              Screening for colorectal malignant neoplasm, This                            is the patient's first colonoscopy Medicines:                Monitored Anesthesia Care Procedure:                Pre-Anesthesia Assessment:                           - Prior to the procedure, a History and Physical                            was performed, and patient medications and                            allergies were reviewed. The patient's tolerance of                            previous anesthesia was also reviewed. The risks                            and benefits of the procedure and the sedation                            options and risks were discussed with the patient.                            All questions were answered, and informed consent                            was obtained. Prior Anticoagulants: The patient has                            taken no previous anticoagulant or antiplatelet                            agents. ASA Grade Assessment: III - A patient with                            severe systemic disease. After reviewing the risks                            and benefits, the patient was deemed in                            satisfactory condition to undergo the procedure.  After obtaining informed consent, the colonoscope                            was passed under direct vision. Throughout the                            procedure, the patient's blood pressure, pulse, and                            oxygen saturations were monitored continuously. The                            Colonoscope was introduced through the anus and                            advanced to the 5 cm  into the ileum. The                            colonoscopy was performed without difficulty. The                            patient tolerated the procedure. The quality of the                            bowel preparation was good. The terminal ileum,                            ileocecal valve, appendiceal orifice, and rectum                            were photographed. Scope In: 8:05:08 AM Scope Out: 8:18:47 AM Scope Withdrawal Time: 0 hours 10 minutes 30 seconds  Total Procedure Duration: 0 hours 13 minutes 39 seconds  Findings:                 The digital rectal exam findings include                            hemorrhoids. Pertinent negatives include no                            palpable rectal lesions.                           The terminal ileum and ileocecal valve appeared                            normal.                           A 5 mm polyp was found in the descending colon. The                            polyp was sessile. The polyp was removed with a  cold snare. Resection and retrieval were complete.                           Multiple small-mouthed diverticula were found in                            the entire colon.                           Normal mucosa was found in the entire colon                            otherwise.                           Non-bleeding non-thrombosed internal hemorrhoids                            were found during retroflexion, during perianal                            exam and during digital exam. The hemorrhoids were                            Grade II (internal hemorrhoids that prolapse but                            reduce spontaneously). Complications:            No immediate complications. Estimated Blood Loss:     Estimated blood loss was minimal. Impression:               - Hemorrhoids found on digital rectal exam.                           - The examined portion of the ileum was normal.                            - One 5 mm polyp in the descending colon, removed                            with a cold snare. Resected and retrieved.                           - Diverticulosis in the entire examined colon.                           - Normal mucosa in the entire examined colon                            otherwise.                           - Non-bleeding non-thrombosed internal hemorrhoids. Recommendation:           - The patient will be observed post-procedure,  until all discharge criteria are met.                           - Discharge patient to home.                           - Patient has a contact number available for                            emergencies. The signs and symptoms of potential                            delayed complications were discussed with the                            patient. Return to normal activities tomorrow.                            Written discharge instructions were provided to the                            patient.                           - High fiber diet.                           - Use FiberCon 1 tablet PO daily.                           - Continue present medications.                           - Await pathology results.                           - Repeat colonoscopy in 7 years for surveillance                            based on final pathology.                           - Patient has described issues of recurrent                            diverticulitis in the past (she states >4                            episodes). She would benefit from a colorectal                            surgery evaluation to further discuss role of                            partial resection, and would like to have this  referral placed. We will help with this.                           - The findings and recommendations were discussed                            with the patient. Justice Britain, MD 01/30/2020 8:27:18  AM

## 2020-01-30 NOTE — Progress Notes (Signed)
VS  DT ? ?Pt's states no medical or surgical changes since previsit or office visit. ? ?

## 2020-01-30 NOTE — Progress Notes (Signed)
Called to room to assist during endoscopic procedure.  Patient ID and intended procedure confirmed with present staff. Received instructions for my participation in the procedure from the performing physician.  

## 2020-01-30 NOTE — Progress Notes (Signed)
VO Lidocaine 2% 26mL.

## 2020-01-30 NOTE — Progress Notes (Signed)
pt tolerated well. VSS. awake and to recovery. Report given to RN.  

## 2020-01-30 NOTE — Patient Instructions (Signed)
Please read handouts provided. High Fiber Diet. Await pathology results. Continue present medications. Use FiberCon 1 tablet daily.       YOU HAD AN ENDOSCOPIC PROCEDURE TODAY AT [facility]:   Refer to the procedure report that was given to you for any specific questions about what was found during the examination.  If the procedure report does not answer your questions, please call your gastroenterologist to clarify.  If you requested that your care partner not be given the details of your procedure findings, then the procedure report has been included in a sealed envelope for you to review at your convenience later.  YOU SHOULD EXPECT: Some feelings of bloating in the abdomen. Passage of more gas than usual.  Walking can help get rid of the air that was put into your GI tract during the procedure and reduce the bloating. If you had a lower endoscopy (such as a colonoscopy or flexible sigmoidoscopy) you may notice spotting of blood in your stool or on the toilet paper. If you underwent a bowel prep for your procedure, you may not have a normal bowel movement for a few days.  Please Note:  You might notice some irritation and congestion in your nose or some drainage.  This is from the oxygen used during your procedure.  There is no need for concern and it should clear up in a day or so.  SYMPTOMS TO REPORT IMMEDIATELY:   Following lower endoscopy (colonoscopy or flexible sigmoidoscopy):  Excessive amounts of blood in the stool  Significant tenderness or worsening of abdominal pains  Swelling of the abdomen that is new, acute  Fever of 100F or higher   For urgent or emergent issues, a gastroenterologist can be reached at any hour by calling [phone]. Do not use MyChart messaging for urgent concerns.    DIET:  We do recommend a small meal at first, but then you may proceed to your regular diet.  Drink plenty of fluids but you should avoid alcoholic beverages for  24 hours.  ACTIVITY:  You should plan to take it easy for the rest of today and you should NOT DRIVE or use heavy machinery until tomorrow (because of the sedation medicines used during the test).    FOLLOW UP: Our staff will call the number listed on your records 48-72 hours following your procedure to check on you and address any questions or concerns that you may have regarding the information given to you following your procedure. If we do not reach you, we will leave a message.  We will attempt to reach you two times.  During this call, we will ask if you have developed any symptoms of COVID 19. If you develop any symptoms (ie: fever, flu-like symptoms, shortness of breath, cough etc.) before then, please call [phone].  If you test positive for Covid 19 in the 2 weeks post procedure, please call and report this information to [place].    If any biopsies were taken you will be contacted by phone or by letter within the next 1-3 weeks.  Please call us at [phone] if you have not heard about the biopsies in 3 weeks.    SIGNATURES/CONFIDENTIALITY: You and/or your care partner have signed paperwork which will be entered into your electronic medical record.  These signatures attest to the fact that that the information above on your After Visit Summary has been reviewed and is understood.  Full responsibility of the confidentiality of this discharge information  your care-partner.  ?

## 2020-02-01 ENCOUNTER — Telehealth: Payer: Self-pay

## 2020-02-01 ENCOUNTER — Telehealth: Payer: Self-pay | Admitting: *Deleted

## 2020-02-01 NOTE — Telephone Encounter (Signed)
  Follow up Call-  Call back number 01/30/2020  Post procedure Call Back phone  # [phone]  Permission to leave phone message Yes  Some recent data might be hidden     Patient questions:  Do you have a fever, pain , or abdominal swelling? No. Pain Score  0 *  Have you tolerated food without any problems? Yes.    Have you been able to return to your normal activities? Yes.    Do you have any questions about your discharge instructions: Diet   No. Medications  No. Follow up visit  No.  Do you have questions or concerns about your Care? No.  Actions: * If pain score is 4 or above: No action needed, pain <4.  Have you developed a fever since your procedure? No 2.   Have you had an respiratory symptoms (SOB or cough) since your procedure? No  3.   Have you tested positive for COVID 19 since your procedure No  4.   Have you had any family members/close contacts diagnosed with the COVID 19 since your procedure?  No  If yes to any of these questions please route to Joylene John, RN and Erenest Rasher, RN

## 2020-02-01 NOTE — Telephone Encounter (Signed)
  Follow up Call-  Call back number 01/30/2020  Post procedure Call Back phone  # [phone]  Permission to leave phone message Yes  Some recent data might be hidden     Patient questions:  Message left to call us if necessary.

## 2020-02-02 ENCOUNTER — Encounter: Payer: Self-pay | Admitting: Gastroenterology

## 2020-02-05 ENCOUNTER — Telehealth: Payer: Self-pay

## 2020-02-05 ENCOUNTER — Ambulatory Visit: Payer: Self-pay | Admitting: Student in an Organized Health Care Education/Training Program

## 2020-02-05 NOTE — Telephone Encounter (Signed)
-----   Message from Irving Copas., MD sent at 01/30/2020  4:58 PM EDT ----- Regarding: Referral Isabel Berger, Patient describes history of multiple episodes of diverticulitis. Can we place a referral to Colorectal surgery to discuss role of potential surgical resection for the patient? Thanks. GM

## 2020-02-05 NOTE — Telephone Encounter (Signed)
[facility] referral has been made and records faxed

## 2020-02-06 ENCOUNTER — Encounter: Payer: Self-pay | Admitting: Neurology

## 2020-02-06 ENCOUNTER — Ambulatory Visit: Payer: Self-pay | Admitting: Neurology

## 2020-02-06 ENCOUNTER — Telehealth: Payer: Self-pay | Admitting: *Deleted

## 2020-02-06 NOTE — Telephone Encounter (Signed)
No showed new patient appointment. 

## 2020-02-21 ENCOUNTER — Other Ambulatory Visit: Payer: Self-pay

## 2020-02-21 ENCOUNTER — Ambulatory Visit (INDEPENDENT_AMBULATORY_CARE_PROVIDER_SITE_OTHER): Payer: Self-pay | Admitting: Student in an Organized Health Care Education/Training Program

## 2020-02-21 VITALS — BP 128/74 | HR 89 | Wt 221.4 lb

## 2020-02-21 DIAGNOSIS — K3184 Gastroparesis: Secondary | ICD-10-CM

## 2020-02-21 DIAGNOSIS — E119 Type 2 diabetes mellitus without complications: Secondary | ICD-10-CM

## 2020-02-21 DIAGNOSIS — E1343 Other specified diabetes mellitus with diabetic autonomic (poly)neuropathy: Secondary | ICD-10-CM

## 2020-02-21 DIAGNOSIS — K5900 Constipation, unspecified: Secondary | ICD-10-CM

## 2020-02-21 DIAGNOSIS — G500 Trigeminal neuralgia: Secondary | ICD-10-CM

## 2020-02-21 MED ORDER — OZEMPIC (0.25 OR 0.5 MG/DOSE) 2 MG/1.5ML ~~LOC~~ SOPN: 1.0000 mg | PEN_INJECTOR | SUBCUTANEOUS | 6 refills | Status: DC

## 2020-02-21 MED ORDER — TRESIBA FLEXTOUCH 200 UNIT/ML ~~LOC~~ SOPN: 60.0000 [IU] | PEN_INJECTOR | Freq: Every day | SUBCUTANEOUS | 6 refills | Status: DC

## 2020-02-21 MED ORDER — FAMOTIDINE 20 MG PO TABS: 20.0000 mg | ORAL_TABLET | Freq: Two times a day (BID) | ORAL | 2 refills | Status: DC

## 2020-02-21 MED ORDER — INSULIN DEGLUDEC 100 UNIT/ML ~~LOC~~ SOPN: 60.0000 [IU] | PEN_INJECTOR | SUBCUTANEOUS | Status: AC

## 2020-02-21 MED ORDER — OZEMPIC (0.25 OR 0.5 MG/DOSE) 2 MG/1.5ML ~~LOC~~ SOPN: 1.0000 mg | PEN_INJECTOR | SUBCUTANEOUS | 0 refills | Status: DC

## 2020-02-21 MED ORDER — TRESIBA FLEXTOUCH 200 UNIT/ML ~~LOC~~ SOPN: 60.0000 [IU] | PEN_INJECTOR | Freq: Every day | SUBCUTANEOUS | 0 refills | Status: DC

## 2020-02-21 NOTE — Progress Notes (Deleted)
    SUBJECTIVE:   CHIEF COMPLAINT / HPI: elevated blood sugars  DM- patient prescribed 60u tresiba daily as well as 0.5mg  ozempic weekly.   PERTINENT  PMH / PSH: ***  OBJECTIVE:   There were no vitals taken for this visit.  *** About 20 minutes ago she is so ASSESSMENT/PLAN:   No problem-specific Assessment & Plan notes found for this encounter.     [place]

## 2020-02-21 NOTE — Assessment & Plan Note (Signed)
-   treating diabetes - prescribed famotidine for symptomatic relief of reflux

## 2020-02-21 NOTE — Assessment & Plan Note (Signed)
Repeat colonoscopy in 7 years Continue fiber supplement and miralax PRN to avoid straining and worsening of diverticulosis - follow up with gen surg referral from GI

## 2020-02-21 NOTE — Assessment & Plan Note (Signed)
Follow-up with neurology 

## 2020-02-21 NOTE — Progress Notes (Signed)
    SUBJECTIVE:   CHIEF COMPLAINT / HPI: elevated blood sugars  DM-  Prescribed tresiba 60u daily as well as ozempic 0.5mg  weekly. Patient currently taking tresiba every other day or every two days because she has been running out for the past month and is trying to stretch it out. MAP program for health department seems to have run out and patient has not followed up with them. Gets 3 days from each pen and she has 2 and a half pens left.  Out of ozempic completely for two months but previously was taking 0.25mg  weekly. But was actually prescribed 0.5mg  weekly.  Checks blood sugars regularly Morning, afternoon and before bed. Endorses a poor diet. BS 463 this morning (fasting), 420 after taking her tresiba this morning. Had oatmeal for breakfast and then blood sugars at 1400 increased to 572. Drinking about 13-14 bottle waters per day. 7-8 voids per day plus 6 nighttime voids.   Experiencing symptoms of Gastroparesis.   July 11th for neurology appointment to follow up for trigeminal neurolgia after missing first appointment.   Colonoscopy completed with polyps of varying degrees and diverticuli. GI recommend Repeat colonoscopy in 7 years and referred to gen surg to consult on possible resection. Metamucil every day plus PRN miralax. 3-4 BMs per day intermittently between diarrhea and constipation.   PERTINENT  PMH / PSH: A1c 4/19 was 6.1  OBJECTIVE:   BP 128/74   Pulse 89   Wt 221 lb 6.4 oz (100.4 kg)   SpO2 99%   BMI 34.68 kg/m   General: NAD, pleasant, able to participate in exam Abdomen: soft, nontender, nondistended, no hepatic or splenomegaly, +BS Extremities: no edema or cyanosis. WWP. 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

## 2020-02-21 NOTE — Patient Instructions (Signed)
It was a pleasure to see you today!  To summarize our discussion for this visit:  I'm worried about your blood sugars being uncontrolled and you're not taking taking your diabetes medications.   We will give you samples today that will get you through the next couple weeks to a month and I've sent in new prescriptions.   Please monitor your blood sugars very carefully back on your medications as to not go too low now.   We have increased ozempic to 1mg  each week.  Some additional health maintenance measures we should update are: Health Maintenance Due  Topic Date Due   Hepatitis C Screening  Never done   FOOT EXAM  Never done   OPHTHALMOLOGY EXAM  Never done   COVID-19 Vaccine (1) Never done   HIV Screening  Never done   TETANUS/TDAP  Never done   PAP SMEAR-Modifier  Never done     Please return to our clinic to see me in about 1 month for a diabetes follow up.  Call the clinic at [phone] if your symptoms worsen or you have any concerns.   Thank you for allowing me to take part in your care,  Dr. Doristine Mango

## 2020-02-21 NOTE — Assessment & Plan Note (Signed)
-   provided patient with samples of ozempic x1 month and tresiba x1 week in clinic - refilled both medications to health department and patient to follow up with MAP program this week and will notifiy [place] if not getting covered so she does not run out - BMP today to monitor electrolytes  - return in 1 month to recheck A1c

## 2020-02-22 LAB — BASIC METABOLIC PANEL
BUN/Creatinine Ratio: 17 (ref 9–23)
BUN: 11 mg/dL (ref 6–24)
CO2: 21 mmol/L (ref 20–29)
Calcium: 9.6 mg/dL (ref 8.7–10.2)
Chloride: 100 mmol/L (ref 96–106)
Creatinine, Ser: 0.65 mg/dL (ref 0.57–1.00)
GFR calc Af Amer: 118 mL/min/{1.73_m2} (ref >59)
GFR calc non Af Amer: 102 mL/min/{1.73_m2} (ref >59)
Glucose: 404 mg/dL — ABNORMAL HIGH (ref 65–99)
Potassium: 4.3 mmol/L (ref 3.5–5.2)
Sodium: 135 mmol/L (ref 134–144)

## 2020-02-23 MED ORDER — FAMOTIDINE 20 MG PO TABS: 20.0000 mg | ORAL_TABLET | Freq: Two times a day (BID) | ORAL | 2 refills | Status: DC

## 2020-02-23 NOTE — Progress Notes (Signed)
Patient calls nurse line stating that medication was sent to the wrong pharmacy. [facility] and cancelled original rx for Pepcid. [facility] [address].   Talbot Grumbling, RN

## 2020-02-27 ENCOUNTER — Telehealth: Payer: Self-pay

## 2020-02-27 NOTE — Telephone Encounter (Signed)
Patient calls nurse line regarding medication management of acid reflux. Patient states that the famotidine has not relieved any of her symptoms and that she is still experiencing a "knot" in her throat with intermittent difficulties with swallowing. Patient states that she has also been gagging more since starting the medication. Denies chest pain or difficulty breathing.   Patient reports blood sugar levels in the 400s, however, reports that she just got re-established with the MAP and has restarted her medications.   ED precautions given. Forwarding to PCP for next steps  Talbot Grumbling, RN

## 2020-02-28 NOTE — Telephone Encounter (Signed)
Patient calls nurse line following up on yesterdays call. Patient reports today she is no better. Patient reports same symptoms with minimal relief from Famotidine. Patient reports her sugars today are ~300. Patient scheduled for tomorrow.

## 2020-02-29 ENCOUNTER — Other Ambulatory Visit: Payer: Self-pay

## 2020-02-29 ENCOUNTER — Ambulatory Visit (INDEPENDENT_AMBULATORY_CARE_PROVIDER_SITE_OTHER): Payer: Self-pay | Admitting: Family Medicine

## 2020-02-29 DIAGNOSIS — R221 Localized swelling, mass and lump, neck: Secondary | ICD-10-CM | POA: Insufficient documentation

## 2020-02-29 MED ORDER — METOCLOPRAMIDE HCL 5 MG PO TABS: 5.0000 mg | ORAL_TABLET | Freq: Two times a day (BID) | ORAL | 0 refills | Status: DC | PRN
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
## 2020-02-29 NOTE — Patient Instructions (Addendum)
Thank you for coming in to see [place] today! Please see below to review our plan for today's visit:  - Take Reglan 1 tablet once daily every morning. If your gut starts to move then you can avoid taking a second tablet. If there's no improvement take a 2nd tablet around 4pm if the afternoon.  - Please follow up with GI to ask about indication for upper endoscopy to rule out Esophageal Stricture on EGD. Also on the differential is diverticulum of the esophagus but this is less likely. - Take Omeprazole 20mg  once daily every morning.  - Follow up 2-4 weeks for symptom management and follow up  Please call the clinic at [phone] if your symptoms worsen or you have any concerns. It was our pleasure to serve you!   Dr. Milus Banister [facility]

## 2020-02-29 NOTE — Assessment & Plan Note (Addendum)
Sensation could be due to decreased rate of esophageal emptying secondary to gastroparesis.  High on the differential includes GERD although it is unusual patient's symptoms worsened with taking Pepcid.  Also consider esophageal stricture versus esophageal diverticulum. - Take Reglan 1 tablet once daily every morning. If your gut starts to move then you can avoid taking a second tablet. If there's no improvement take a 2nd tablet around 4pm if the afternoon.  - Please follow up with GI to ask about indication for upper endoscopy to rule out Esophageal Stricture on EGD. Also on the differential is diverticulum of the esophagus but this is less likely. - Take Omeprazole 20mg  once daily every morning.

## 2020-03-22 ENCOUNTER — Ambulatory Visit: Payer: Self-pay | Admitting: Student in an Organized Health Care Education/Training Program

## 2020-04-02 ENCOUNTER — Ambulatory Visit (INDEPENDENT_AMBULATORY_CARE_PROVIDER_SITE_OTHER): Payer: Self-pay | Admitting: Family Medicine

## 2020-04-02 DIAGNOSIS — R221 Localized swelling, mass and lump, neck: Secondary | ICD-10-CM

## 2020-04-02 DIAGNOSIS — Z5329 Procedure and treatment not carried out because of patient's decision for other reasons: Secondary | ICD-10-CM

## 2020-04-02 NOTE — Progress Notes (Signed)
  Patient no-showed for appointment 8/10.   Was to come in for:  Sensation of Lump in Throat: 4 week follow up. Health maintenance: Hep C screen, Tdap, Foot exam, HIV, Pap smear, and foot exam  Additionally, patient has no showed to the [facility]  12/12/19 12/18/19 02/05/20 03/22/20, and  04/02/20.  I called the patient at home [phone], did not answer but left HIPAA compliant voicemail stating no-show policy. She was informed that we WILL continue to see her, however one more no-show will lead to patient being dismissed from the [facility]. I also told her we would be sending a letter to her house stating this information and listing the dates of missed/late cancelled appointments. Instructed her to call the clinic at [phone] should she have any questions or would like to reschedule.    Milus Banister, [place], PGY-3 04/03/2020 9:46 AM

## 2020-04-03 DIAGNOSIS — Z5329 Procedure and treatment not carried out because of patient's decision for other reasons: Secondary | ICD-10-CM | POA: Insufficient documentation

## 2020-04-03 DIAGNOSIS — Z91199 Patient's noncompliance with other medical treatment and regimen due to unspecified reason: Secondary | ICD-10-CM | POA: Insufficient documentation

## 2020-06-19 ENCOUNTER — Other Ambulatory Visit: Payer: Self-pay

## 2020-06-19 ENCOUNTER — Encounter: Payer: Self-pay | Admitting: Family Medicine

## 2020-06-19 ENCOUNTER — Ambulatory Visit (INDEPENDENT_AMBULATORY_CARE_PROVIDER_SITE_OTHER): Payer: Self-pay | Admitting: Family Medicine

## 2020-06-19 DIAGNOSIS — M25572 Pain in left ankle and joints of left foot: Secondary | ICD-10-CM

## 2020-06-19 DIAGNOSIS — M25579 Pain in unspecified ankle and joints of unspecified foot: Secondary | ICD-10-CM | POA: Insufficient documentation

## 2020-06-19 MED ORDER — DICLOFENAC SODIUM 75 MG PO TBEC: 75.0000 mg | DELAYED_RELEASE_TABLET | Freq: Two times a day (BID) | ORAL | 0 refills | Status: DC

## 2020-06-19 NOTE — Progress Notes (Signed)
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  Long hours without any days off.  Started with diffuse pain in shoulder and legs since has worsened with severe pain in her L posterior ankle.  No specific injury or fall.  No weakness or loss of sensation.  No specific joint pain or swelling.  No new medications.  No change in urine   PERTINENT  PMH / PSH: was taking tegretol for face pain but has resolved  OBJECTIVE:   BP 134/82    Pulse 93    Wt 221 lb 12.8 oz (100.6 kg)    SpO2 97%    BMI 34.74 kg/m   Mobility:able to get up and down from exam table without assistance and mild distress L Ankle - very tender posteriorly over achilles especially with flexion extension.  No crepitus. Distal toe function and sensation intact.  No calf tenderness  Has FROM of all major joints but with mild pain   ASSESSMENT/PLAN:   Ankle pain Seems most consistent with achilles tendonitis due to overuse.  Recommend diclofenac and after visit summary instructions.   Her more diffuse pain also seems due to overuse.        Lind Covert, MD [place]

## 2020-06-19 NOTE — Assessment & Plan Note (Signed)
Seems most consistent with achilles tendonitis due to overuse.  Recommend diclofenac and after visit summary instructions.   Her more diffuse pain also seems due to overuse.

## 2020-06-19 NOTE — Patient Instructions (Addendum)
Good to see you today!  Thanks for coming in.  Come back and see Dr Ouida Sills for diabetes and blood pressure.  Bring all your medication bottles  You have an overuse injury   Get heel supports with arch support - wear in both shoess  Ice it 4 x a day for at least 20 minutes each time  Stay active but do not carry heavy things very often  Take diclofenac 75 mg twice a day with food - do not take any other pain medications with this except tylenol   Can try gabapentin at bedtime to help with sleep

## 2020-06-20 ENCOUNTER — Other Ambulatory Visit: Payer: Self-pay | Admitting: Student in an Organized Health Care Education/Training Program

## 2020-07-16 ENCOUNTER — Encounter: Payer: Self-pay | Admitting: Family Medicine

## 2020-07-16 ENCOUNTER — Ambulatory Visit (INDEPENDENT_AMBULATORY_CARE_PROVIDER_SITE_OTHER): Payer: Self-pay | Admitting: Family Medicine

## 2020-07-16 ENCOUNTER — Other Ambulatory Visit: Payer: Self-pay

## 2020-07-16 VITALS — BP 122/74 | HR 85 | Ht 67.0 in | Wt 223.0 lb

## 2020-07-16 DIAGNOSIS — E119 Type 2 diabetes mellitus without complications: Secondary | ICD-10-CM

## 2020-07-16 DIAGNOSIS — F439 Reaction to severe stress, unspecified: Secondary | ICD-10-CM

## 2020-07-16 DIAGNOSIS — Z23 Encounter for immunization: Secondary | ICD-10-CM

## 2020-07-16 DIAGNOSIS — M7662 Achilles tendinitis, left leg: Secondary | ICD-10-CM

## 2020-07-16 LAB — POCT GLYCOSYLATED HEMOGLOBIN (HGB A1C): Hemoglobin A1C: 5.7 % — AB (ref 4.0–5.6)

## 2020-07-16 MED ORDER — NYSTATIN 100000 UNIT/GM EX POWD
Freq: Four times a day (QID) | CUTANEOUS | 0 refills | Status: DC
Start: 2020-07-16 — End: 2021-02-28

## 2020-07-16 MED ORDER — TRESIBA FLEXTOUCH 200 UNIT/ML ~~LOC~~ SOPN: 54.0000 [IU] | PEN_INJECTOR | Freq: Every day | SUBCUTANEOUS | 1 refills | Status: DC

## 2020-07-16 NOTE — Patient Instructions (Addendum)
It was nice seeing you today, Isabel Berger.  Today, we talked about diabetes and foot pain.   Your A1c today was 5.9, so you are doing very well from a diabetes standpoint.  However, I am worried that your insulin is causing her sugars to go too low.  Please decrease your Tyler Aas to 54 units.  As far as your foot pain, I recommend trying some stretches.  I have also placed a referral for physical therapy and sports medicine.  Please stop taking the diclofenac if you do not feel like it helps.  I have attached a list of therapists with their phone numbers.  Please follow-up with your PCP Dr. Ermalene Searing in 1 month for further diabetes management and follow-up for your foot pain.  Stay well, Isabel Button, MD    Therapy and Counseling Resources Most providers on this list will take Medicaid. Patients with commercial insurance or Medicare should contact their insurance company to get a list of in network providers.  BestDay:Psychiatry and Counseling [address] [facility] [place]. [place], [place] [address] [place]  [address] [place], [place] [address]      [place] [address]  [place], [place] [address] [phone]  [place] [address] [place]  [place], [place] [address]       [phone]      Jinny Blossom [facility] [address], [place], [place]  Family Solutions:  [place] [phone]  [facility]:  [place] [address] [place] [phone]  [facility] (under & uninsured) [address] [place] [phone]    [email or web address]    [place] [address] . [place]    [phone]  [facility] [place]     Phone:  [phone]     [place]  [place] [address]      [place], [place] [address] [place]: [place], [place], [place]   [place] ([place] therapist) [email or web address]  [place] [address]   [place] [address]    [phone]    [facility]   [address],  [place], [place]   [place]  [place]  [facility]  [address] [place], [place]        [phone]  Open Access/Walk In Clinic under & uninsured  [facility]  [address] [place], [place] Crisis [phone]  [facility] of the [place],  ([place])   [place], [place]: [phone]) 8:30 - 12; 1 - 2:30  Family Service of the [place],  [place], [place]    [phone]):8:30 - 12; 2 - 3PM  [facility] [employer],  [address]  [place]; [phone]):   Mon - Fri 8 AM - 5 PM  [facility] [place]  MWF 12:30 to 3:00 or call to schedule an appointment  [phone]  Specific Provider options Psychology Today  [email or web address] 1. click on find a therapist  2. enter your zip code 3. left side and  select or tailor a therapist for your specific need.   [facility] Provider Directory [email or web address]  (Medicaid)   Follow all drop down to find a provider  [place] [phone] or [email or web address] [address], [place], [place] Recovery support and educational   24- Hour Availability:  .  [place] [facility]  . [place], [place] Crisis [phone]  . Family Service of the [employer] [phone]  [facility] Crisis Service  [phone]   . [place]  [phone] (after hours)  . Therapeutic Alternative/Mobile Crisis   [phone]  . [place] National Suicide Hotline  (323) 238-3643 ([place])  . Call 911 or go to emergency room  . [employer]  [phone]);  [place] and [employer]   . [facility] ACCESS  [phone]); [place], [place], [place], [place], [place], [place], [place]

## 2020-07-16 NOTE — Assessment & Plan Note (Signed)
A1c improved, 5.7 today.  Current regimen includes Tresiba 60 units daily and semaglutide.  Fasting sugar 100-120s.  Patient appears to be having symptomatic hypoglycemia, so we will decrease insulin. - decrease Tresiba to 54 units daily - continue semaglutide

## 2020-07-16 NOTE — Progress Notes (Signed)
    SUBJECTIVE:   CHIEF COMPLAINT / HPI:   Left foot pain Most recently seen here by Dr. Erin Hearing on 10/27 for diffuse pain in shoulders and legs with severe pain in left posterior ankle. Thought to be Achilles tendinitis due to overuse, recommended diclofenac, heel supports with arch support, ice, and gabapentin for sleep.  Today, she reports pain worsened 3 days ago on Saturday, felt a pop. Also has been swelling. Limping worse after. Working in [employer] since Oct. Has been icing, improves with ice but then pain returns. Has been taking diclofenac, does not feel like it helps.  T2DM Sugars running between 101 and 120 fasting. Highest 120s during the day. Feels lightheaded/dizzy occasionally, 1-2 times/week, mostly when she doesn't eat as much. Typically eats once a day.  Stress Patient reporting a lot of stress regarding her job and financial situation. Amenable to establishing with a therapist.  PERTINENT  PMH / [place]: HTN, T2DM with peripheral neuropathy, allergic rhinitis, trigeminal neuralgia, BPPV  OBJECTIVE:   BP 122/74   Pulse 85   Ht 5\' 7"  (1.702 m)   Wt 223 lb (101.2 kg)   SpO2 93% Comment: has on artificical nails  BMI 34.93 kg/m   General: Obese woman, NAD CV: RRR, no murmurs, DP pulses 2+ bilaterally Pulm: CTAB, no wheezes or rales MSK: Tenderness to left Achilles tendon with some calf tenderness. Left foot ROM intact though limited by pain. Pain elicited with left foot dorsiflexion and plantarflexion but primarily with dorsiflexion. Thompson test negative. No significant warmth or swelling appreciated. Neuro: full strength bilaterally with dorsiflexion and plantarflexion though limited by pain on the left  Depression screen [facility] 2/9 07/16/2020  Decreased Interest 2  Down, Depressed, Hopeless 2  PHQ - 2 Score 4  Altered sleeping 3  Tired, decreased energy 2  Change in appetite 2  Feeling bad or failure about yourself  0  Trouble concentrating 0  Moving  slowly or fidgety/restless 2  Suicidal thoughts 0  PHQ-9 Score 13  Difficult doing work/chores Extremely dIfficult     ASSESSMENT/PLAN:   T2DM (type 2 diabetes mellitus) (HCC) A1c improved, 5.7 today.  Current regimen includes Tresiba 60 units daily and semaglutide.  Fasting sugar 100-120s.  Patient appears to be having symptomatic hypoglycemia, so we will decrease insulin. - decrease Tresiba to 54 units daily - continue semaglutide  Achilles tendinitis, left Patient with new onset left foot pain consistent with Achilles tendinitis secondary to overuse injury.  Recently worsened over the past few days with notable popping sensation, could consider partial Achilles tendon tear.  Thompson test negative on exam.  Given recent worsening of pain, may be reasonable to obtain imaging such as ultrasound, so will refer her to sports medicine. - Achilles tendon stretches given - PT referral - sports medicine referral  Stress Patient with elevated PHQ9 score of 13 in the setting of stress from work and finances. No SI/HI. Amenable to trying therapy. - therapy resources given  Immunizations Covid booster shot given per patient request.  Isabel Button, MD [place]

## 2020-07-30 ENCOUNTER — Ambulatory Visit: Payer: Self-pay | Admitting: Sports Medicine

## 2020-08-07 ENCOUNTER — Other Ambulatory Visit: Payer: Self-pay | Admitting: Student in an Organized Health Care Education/Training Program

## 2020-08-21 ENCOUNTER — Other Ambulatory Visit: Payer: Self-pay | Admitting: Student in an Organized Health Care Education/Training Program

## 2020-10-07 ENCOUNTER — Encounter: Payer: Self-pay | Admitting: Family Medicine

## 2020-10-07 ENCOUNTER — Other Ambulatory Visit: Payer: Self-pay

## 2020-10-07 ENCOUNTER — Ambulatory Visit (INDEPENDENT_AMBULATORY_CARE_PROVIDER_SITE_OTHER): Payer: Self-pay | Admitting: Family Medicine

## 2020-10-07 VITALS — BP 138/90 | HR 90 | Ht 67.0 in | Wt 220.0 lb

## 2020-10-07 DIAGNOSIS — G44319 Acute post-traumatic headache, not intractable: Secondary | ICD-10-CM | POA: Insufficient documentation

## 2020-10-07 DIAGNOSIS — E119 Type 2 diabetes mellitus without complications: Secondary | ICD-10-CM

## 2020-10-07 LAB — GLUCOSE, POCT (MANUAL RESULT ENTRY): POC Glucose: 86 mg/dl (ref 70–99)

## 2020-10-07 MED ORDER — OZEMPIC (1 MG/DOSE) 4 MG/3ML ~~LOC~~ SOPN: PEN_INJECTOR | SUBCUTANEOUS | 1 refills | Status: DC
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
## 2020-10-07 NOTE — Assessment & Plan Note (Signed)
CBGs previously uncontrolled per patient but she is now back on her regimen and has CBGs within normal limits.  Today was 86.  Refill for Ozempic provided at her request.  Follow-up with PCP.

## 2020-10-07 NOTE — Assessment & Plan Note (Signed)
Given possibility of positional headache especially since having worsening headaches after trauma, will go ahead and obtain a CT head without contrast to rule out intracranial bleed or other cause of increased intracranial pressure.  Reassuringly, her neuro exam is intact.  Discussed this case with Dr. Andria Frames, who also agrees the patient should have a CT head.  Discussed with her that her symptoms could be caused by concussion and that this would resolve over time, but need to rule out more dangerous things.  Scheduled CT for the patient prior to leaving.  She is to follow-up in 1 to 2 weeks with PCP if her symptoms do not resolve.  She can take Tylenol in the meantime for pain.

## 2020-10-07 NOTE — Patient Instructions (Signed)
Thank you for coming to see me today. It was a pleasure. Today we talked about:   You can take tylenol for your headaches.  We will get you scheduled for a CT scan of your head.  Please follow-up with PCP in 1-2 weeks if no improvement.  If you have any questions or concerns, please do not hesitate to call the office at [phone].  Best,   [place] Constable, DO

## 2020-10-08 ENCOUNTER — Ambulatory Visit (HOSPITAL_COMMUNITY)
Admission: RE | Admit: 2020-10-08 | Discharge: 2020-10-08 | Disposition: A | Discharge status: Home / Self Care | Payer: Self-pay | Source: Ambulatory Visit | Attending: Family Medicine | Admitting: Family Medicine

## 2020-10-08 DIAGNOSIS — G44319 Acute post-traumatic headache, not intractable: Secondary | ICD-10-CM | POA: Insufficient documentation

## 2020-10-31 ENCOUNTER — Telehealth: Payer: Self-pay

## 2020-10-31 NOTE — Telephone Encounter (Signed)
Patient calls nurse line regarding continued concussion symptoms. Patient wanted to schedule for next available appointment. However, clinic does not have any openings until Monday. Patient reports intermittent severe headache with associated dizziness. States she feels like she is going to pass out. Due to recent head injury and current symptoms advised patient to be evaluated in ED/ UC.   Patient verbalizes understanding. Scheduled follow up appointment on Monday 3/14.  Talbot Grumbling, RN

## 2020-11-04 ENCOUNTER — Ambulatory Visit (INDEPENDENT_AMBULATORY_CARE_PROVIDER_SITE_OTHER): Payer: Self-pay | Admitting: Family Medicine

## 2020-11-04 ENCOUNTER — Other Ambulatory Visit: Payer: Self-pay

## 2020-11-04 VITALS — BP 130/60 | HR 89 | Ht 67.0 in | Wt 214.6 lb

## 2020-11-04 DIAGNOSIS — R55 Syncope and collapse: Secondary | ICD-10-CM

## 2020-11-04 DIAGNOSIS — E119 Type 2 diabetes mellitus without complications: Secondary | ICD-10-CM

## 2020-11-04 LAB — POCT GLYCOSYLATED HEMOGLOBIN (HGB A1C): Hemoglobin A1C: 5.4 % (ref 4.0–5.6)

## 2020-11-04 LAB — GLUCOSE, POCT (MANUAL RESULT ENTRY): POC Glucose: 64 mg/dl — AB (ref 70–99)

## 2020-11-04 NOTE — Assessment & Plan Note (Addendum)
Recent A1c 5.7.  Today it was 5.4.  Her CBG was 64.  Her symptoms of presyncope are most likely related to her low blood sugars.  I have asked the patient to decrease her Tresiba by 10 units and follow-up with me in the next 1 to 2 weeks.  I have also asked her to check her blood sugar every time she has only his episodes and write down all her blood sugar readings for me so that we can go over them in the next week.  It seems like her blood sugar is gone from extremely poorly controlled to too tightly controlled.  Have also ordered a CBC.  Orthostatics were negative for systolic changes and heart rate changes.  Originally was going to send in referral to cardiology for Holter monitor, but after CBG of 64 came back we will hold off on that to see if the symptoms improve with better blood sugar control.
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
## 2020-11-04 NOTE — Patient Instructions (Signed)
It was nice to see you today,  I have put in a referral to the cardiologist so that they can do a Holter monitor to check your heart rhythms.  I have also ordered some blood test today.  I will call you with the results when I get them.  I would like you to check your blood sugar every time we have when he is episodes.  If it is low (less than 90) you can drink something sugary and let us know about the results.  Please keep a log of all the time you check your blood sugar and bring them in the next time you see [place].  I would like to follow back up with you in 2 weeks.  Have a great day,  Clemetine Marker, MD

## 2020-11-05 LAB — CBC
Hematocrit: 37.5 % (ref 34.0–46.6)
Hemoglobin: 12.4 g/dL (ref 11.1–15.9)
MCH: 30 pg (ref 26.6–33.0)
MCHC: 33.1 g/dL (ref 31.5–35.7)
MCV: 91 fL (ref 79–97)
Platelets: 442 10*3/uL (ref 150–450)
RBC: 4.13 x10E6/uL (ref 3.77–5.28)
RDW: 12.8 % (ref 11.7–15.4)
WBC: 8 10*3/uL (ref 3.4–10.8)

## 2020-11-19 ENCOUNTER — Ambulatory Visit: Payer: Self-pay | Admitting: Student in an Organized Health Care Education/Training Program

## 2020-12-18 ENCOUNTER — Other Ambulatory Visit: Payer: Self-pay | Admitting: Student in an Organized Health Care Education/Training Program

## 2020-12-18 ENCOUNTER — Other Ambulatory Visit: Payer: Self-pay | Admitting: Family Medicine

## 2020-12-18 DIAGNOSIS — E119 Type 2 diabetes mellitus without complications: Secondary | ICD-10-CM

## 2020-12-21 ENCOUNTER — Emergency Department (HOSPITAL_BASED_OUTPATIENT_CLINIC_OR_DEPARTMENT_OTHER)
Admission: EM | Admit: 2020-12-21 | Discharge: 2020-12-22 | Disposition: A | Discharge status: Home / Self Care | Payer: Self-pay | Attending: Emergency Medicine | Admitting: Emergency Medicine

## 2020-12-21 ENCOUNTER — Encounter (HOSPITAL_BASED_OUTPATIENT_CLINIC_OR_DEPARTMENT_OTHER): Payer: Self-pay | Admitting: *Deleted

## 2020-12-21 ENCOUNTER — Telehealth: Payer: Self-pay | Admitting: Family Medicine

## 2020-12-21 ENCOUNTER — Other Ambulatory Visit: Payer: Self-pay

## 2020-12-21 DIAGNOSIS — I10 Essential (primary) hypertension: Secondary | ICD-10-CM | POA: Insufficient documentation

## 2020-12-21 DIAGNOSIS — E1142 Type 2 diabetes mellitus with diabetic polyneuropathy: Secondary | ICD-10-CM | POA: Insufficient documentation

## 2020-12-21 DIAGNOSIS — M79671 Pain in right foot: Secondary | ICD-10-CM | POA: Insufficient documentation

## 2020-12-21 DIAGNOSIS — Z794 Long term (current) use of insulin: Secondary | ICD-10-CM | POA: Insufficient documentation

## 2020-12-21 NOTE — ED Triage Notes (Signed)
Pt reports foot pain since yesterday. Denies injury. States she spoke to her doctor and they advised her to be seen to r/o blood clot

## 2020-12-21 NOTE — Telephone Encounter (Signed)
**  After Hours/ Emergency Line Call**  Received a call from [employer] .  Pt is Endorsing right foot and calf pain since last night.  Describes the pain as 'burning/tingling'. Describes it as 'like her neuropathy but worse'.  Also complaining of leg swelling on that side below the knee.  No weakness in the leg.  No pain/weakness in the upper extremities.  No facial droop or dysarthria. No dyspnea. 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  Although this is most likely 2/2 her diabetic neuropathy, given the report of unilateral leg pain and swelling I advised the patient to go to ED for further evaluation of possible DVT.  Red flags discussed.  Will forward to PCP.  Clemetine Marker, MD PGY-3, [facility] 12/21/2020 8:19 PM

## 2020-12-22 ENCOUNTER — Ambulatory Visit (HOSPITAL_BASED_OUTPATIENT_CLINIC_OR_DEPARTMENT_OTHER)
Admission: RE | Admit: 2020-12-22 | Discharge: 2020-12-22 | Disposition: A | Discharge status: Home / Self Care | Payer: Self-pay | Source: Ambulatory Visit | Attending: Emergency Medicine | Admitting: Emergency Medicine

## 2020-12-22 ENCOUNTER — Encounter (HOSPITAL_BASED_OUTPATIENT_CLINIC_OR_DEPARTMENT_OTHER): Payer: Self-pay | Admitting: Emergency Medicine

## 2020-12-22 DIAGNOSIS — M79604 Pain in right leg: Secondary | ICD-10-CM | POA: Insufficient documentation

## 2020-12-22 MED ORDER — LIDOCAINE 5 % EX PTCH
1.0000 | MEDICATED_PATCH | CUTANEOUS | Status: DC
  Administered 2020-12-22: 1 via TRANSDERMAL
  Filled 2020-12-22: qty 1

## 2020-12-22 NOTE — ED Notes (Signed)
AMA process explained and MSE waiver signed by patient. 
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
## 2020-12-23 NOTE — Telephone Encounter (Signed)
error 

## 2021-01-02 ENCOUNTER — Ambulatory Visit (INDEPENDENT_AMBULATORY_CARE_PROVIDER_SITE_OTHER): Payer: Self-pay | Admitting: Family Medicine

## 2021-01-02 ENCOUNTER — Other Ambulatory Visit: Payer: Self-pay

## 2021-01-02 VITALS — BP 140/72 | HR 93 | Ht 67.0 in | Wt 217.4 lb

## 2021-01-02 DIAGNOSIS — M792 Neuralgia and neuritis, unspecified: Secondary | ICD-10-CM

## 2021-01-02 DIAGNOSIS — E1142 Type 2 diabetes mellitus with diabetic polyneuropathy: Secondary | ICD-10-CM

## 2021-01-02 MED ORDER — GABAPENTIN 100 MG PO CAPS: ORAL_CAPSULE | ORAL | 0 refills | Status: DC

## 2021-01-02 MED ORDER — GABAPENTIN 100 MG PO CAPS
ORAL_CAPSULE | ORAL | 0 refills | Status: DC
Start: 2021-01-02 — End: 2021-01-02

## 2021-01-02 MED ORDER — LIDOPURE PATCH 5 % EX KIT: 1.0000 | PACK | Freq: Two times a day (BID) | CUTANEOUS | 0 refills | Status: DC | PRN

## 2021-01-02 MED ORDER — DICLOFENAC SODIUM 75 MG PO TBEC: 75.0000 mg | DELAYED_RELEASE_TABLET | Freq: Two times a day (BID) | ORAL | 0 refills | Status: DC

## 2021-01-02 NOTE — Patient Instructions (Addendum)
Take 100 mg gabapentin in the morning and afternoon as you have been doing.  Increase you night time dose to 200 mg and then 300 mg after a few days.   Use lidocaine patch and voltaren tablets. Take voltaren with food.  Come back in 5-7 days for followup    Decrease your tresiba for morning sugar goals of 120

## 2021-01-02 NOTE — Progress Notes (Signed)
SUBJECTIVE:   CHIEF COMPLAINT / HPI:   Foot Pain and Swelling   This patient was seen in the emergency department on 12/21/2020 for pain.  The pain started on 12/20/2021. Started on the right lateral foot and spread to the back of her right leg. Now on both legs starting the day before yesterday. On the sides of both of her feet and up the back of her legs. Constant. Making it difficult to walk. Patient explains that it feels like her foot is asleep, but 30x  Worse.  Takes tylenol and gabapentin in the morning. Starting taking BC powder 2 a day from about one week.  History of nerve surgery in her legs 10 years ago. Was told that her nerve endings are regenerating. She has areas of her lateral ankle that she didn't have feeling, but now is having feeling.  No knee pain, but does have history of knee issues.   Denies low extremity weakness, urinary retention, bladder or bowel incontinence, saddle anesthesia, or other lower extremity neurological abnormalities.   History of lower back pain. Lumbar vert pathology from 17 years ago.   PERTINENT  PMH / PSH: foot surgery x 3 in 2008 Lumbar spondylosis and DDD   OBJECTIVE:   BP 140/72   Pulse 93   Ht 5\' 7"  (1.702 m)   Wt 217 lb 6.4 oz (98.6 kg)   SpO2 97%   BMI 34.05 kg/m    General: Well-appearing female, no acute distress.  Mild distress with physical exam. Lower extremities: On inspection, mild swelling of the left ankle but no pitting.  On palpation, tenderness to bilateral calves, right fibular head, left Achilles insertion site.  Patient has full range of motion.  Sensation intact bilaterally except for area of lateral right ankle.  Vibratory testing intact bilaterally.  Strength 5 out of 5.  2+ patellar and Achilles reflexes.   IMPRESSION: MRI 01/30/2016 Lumbar  1. Lumbar spondylosis and degenerative disc disease causing moderate impingement at L3-4 and L4-5, and mild impingement at L5-S1, as detailed above. This is worsened  compared to the prior lumbar spine MRI from 01/03/2005.   ASSESSMENT/PLAN:   Neuropathic pain of foot Patient presenting with neuropathic type pain of bilateral feet.  Pain is located on lateral foot and radiates upwards to posterior calves.  Patient reports the pain is rather severe and has been taking BC powder, NSAIDs, Tylenol multiple times a day for the past week with some improvement.  She presents today because it continues to worsen. Patient has multiple causes of potential pain. Patient had quick recent improvement with her A1c from greater than 15-5.4.  This may be associated with the phenomena that can cause extreme neuropathic pain.  Though unclear given a few years between A1c's. She also has a history of lumbar pathology.  Her last MRI in 2017 showed worsening of her lumbar spine since 2006.  She does report recent worsening of pain.  She does not have any red flag symptoms of lower back pain. She also has histroy of bilatearl ankle surgeries which she reports nerve injury after both surgeries and subsequent parasthesia in lateral bilateral feet. She reports feeling has been coming back in both areas and thinks this could be possible pain from nerve regeneration.  Multiple MSK issues of knees, lumbar, ankle that could be contributing.  -Treat pain with increasing doses of gabapentin, Voltaren (counseled to discontinue use of BC powder if taking Voltaren), lidocaine patch -Obtain BMP given possible prolonged BC  powder and NSAID use -return in one week for further evaluation and work up.    Wilber Oliphant, MD [place]

## 2021-01-02 NOTE — Assessment & Plan Note (Addendum)
Patient presenting with neuropathic type pain of bilateral feet.  Pain is located on lateral foot and radiates upwards to posterior calves.  Patient reports the pain is rather severe and has been taking BC powder, NSAIDs, Tylenol multiple times a day for the past week with some improvement.  She presents today because it continues to worsen. Patient has multiple causes of potential pain. Patient had quick recent improvement with her A1c from greater than 15-5.4.  This may be associated with the phenomena that can cause extreme neuropathic pain.  Though unclear given a few years between A1c's. She also has a history of lumbar pathology.  Her last MRI in 2017 showed worsening of her lumbar spine since 2006.  She does report recent worsening of pain.  She does not have any red flag symptoms of lower back pain. She also has histroy of bilatearl ankle surgeries which she reports nerve injury after both surgeries and subsequent parasthesia in lateral bilateral feet. She reports feeling has been coming back in both areas and thinks this could be possible pain from nerve regeneration.  Multiple MSK issues of knees, lumbar, ankle that could be contributing.  -Treat pain with increasing doses of gabapentin, Voltaren (counseled to discontinue use of BC powder if taking Voltaren), lidocaine patch -Obtain BMP given possible prolonged BC powder and NSAID use -return in one week for further evaluation and work up.

## 2021-01-03 LAB — BASIC METABOLIC PANEL
BUN/Creatinine Ratio: 12 (ref 9–23)
BUN: 9 mg/dL (ref 6–24)
CO2: 23 mmol/L (ref 20–29)
Calcium: 9.8 mg/dL (ref 8.7–10.2)
Chloride: 103 mmol/L (ref 96–106)
Creatinine, Ser: 0.76 mg/dL (ref 0.57–1.00)
Glucose: 102 mg/dL — ABNORMAL HIGH (ref 65–99)
Potassium: 3.9 mmol/L (ref 3.5–5.2)
Sodium: 139 mmol/L (ref 134–144)
eGFR: 94 mL/min/{1.73_m2} (ref >59)

## 2021-01-27 ENCOUNTER — Other Ambulatory Visit: Payer: Self-pay | Admitting: Student in an Organized Health Care Education/Training Program

## 2021-01-27 DIAGNOSIS — E119 Type 2 diabetes mellitus without complications: Secondary | ICD-10-CM

## 2021-02-28 ENCOUNTER — Ambulatory Visit (INDEPENDENT_AMBULATORY_CARE_PROVIDER_SITE_OTHER): Payer: 59 | Admitting: Family Medicine

## 2021-02-28 ENCOUNTER — Other Ambulatory Visit: Payer: Self-pay

## 2021-02-28 ENCOUNTER — Encounter: Payer: Self-pay | Admitting: Family Medicine

## 2021-02-28 ENCOUNTER — Ambulatory Visit (HOSPITAL_COMMUNITY)
Admission: RE | Admit: 2021-02-28 | Discharge: 2021-02-28 | Disposition: A | Discharge status: Home / Self Care | Payer: 59 | Source: Ambulatory Visit | Attending: Family Medicine | Admitting: Family Medicine

## 2021-02-28 VITALS — BP 141/96 | HR 88 | Ht 67.0 in | Wt 210.1 lb

## 2021-02-28 DIAGNOSIS — R079 Chest pain, unspecified: Secondary | ICD-10-CM

## 2021-02-28 DIAGNOSIS — R252 Cramp and spasm: Secondary | ICD-10-CM

## 2021-02-28 DIAGNOSIS — Z794 Long term (current) use of insulin: Secondary | ICD-10-CM | POA: Diagnosis not present

## 2021-02-28 DIAGNOSIS — E1142 Type 2 diabetes mellitus with diabetic polyneuropathy: Secondary | ICD-10-CM

## 2021-02-28 DIAGNOSIS — R0789 Other chest pain: Secondary | ICD-10-CM

## 2021-02-28 DIAGNOSIS — E119 Type 2 diabetes mellitus without complications: Secondary | ICD-10-CM

## 2021-02-28 DIAGNOSIS — E11620 Type 2 diabetes mellitus with diabetic dermatitis: Secondary | ICD-10-CM

## 2021-02-28 DIAGNOSIS — B372 Candidiasis of skin and nail: Secondary | ICD-10-CM

## 2021-02-28 LAB — POCT GLYCOSYLATED HEMOGLOBIN (HGB A1C): HbA1c, POC (controlled diabetic range): 11.3 % — AB (ref 0.0–7.0)

## 2021-02-28 LAB — POCT CBG (FASTING - GLUCOSE)-MANUAL ENTRY: Glucose Fasting, POC: 343 mg/dL — AB (ref 70–99)

## 2021-02-28 MED ORDER — DICLOFENAC SODIUM 1 % EX GEL: 4.0000 g | Freq: Four times a day (QID) | CUTANEOUS | 0 refills | Status: DC

## 2021-02-28 MED ORDER — GABAPENTIN 100 MG PO CAPS: ORAL_CAPSULE | ORAL | 0 refills | Status: DC

## 2021-02-28 MED ORDER — NYSTATIN 100000 UNIT/GM EX POWD: Freq: Four times a day (QID) | CUTANEOUS | 0 refills | Status: DC

## 2021-02-28 NOTE — Progress Notes (Signed)
   SUBJECTIVE:   CHIEF COMPLAINT / HPI:   Chief Complaint  Patient presents with   Follow-up    A1C     Isabel Berger is a 55 y.o. female here for diabetes follow up. Pt also has chest pain.   Diabetes Mellitus  Reports she has been out of Ozempic for the past 4 weeks. She is taking 50units of long acting insulin daily. Has been decreasing insulin dosing over the past few months. Previously took 60u insulin. Denies missing any insulin doses. Denies increased thirst, hunger, or frequent urination. States glucometer has been reading "high" for the past 2 weeks. This morning, CBG was 475. Reports using Nystatin powder as needed but recently ran out.   Chest Pain Reports intermittent "squeezing, tightness" in her central chest. Says pain lasts for about 4-5sec then goes away without treatment. Says she was supposed to see a cardiologist for a holter monitor but this never occurred. Has had similar pain previously. Has never had a stress test. Denies shortness of breath, diaphoresis, jaw or neck pain. No pain currently. Had pain yesterday.     PERTINENT  PMH / PSH: reviewed and updated as appropriate   OBJECTIVE:   BP (!) 141/96   Pulse 88   Ht 5\' 7"  (1.702 m)   Wt 210 lb 2 oz (95.3 kg)   SpO2 98%   BMI 32.91 kg/m    GEN: well appearing female, in no acute distress  CV: regular rate and rhythm, no murmurs appreciated RESP: no increased work of breathing, clear to ascultation bilaterally  SKIN: warm, dry   ASSESSMENT/PLAN:   T2DM (type 2 diabetes mellitus) ([place]) Uncontrolled.  A1c today 11.3 and previously 5.4. Was well controlled over the past 2 years. This 11.3 is vastly different. Referral to pharmacy placed.   Encouraged continued diet rich in vegetables and complex carbs.  Heart healthy carb modified diet. Counseled on need to continue exercising. Nystatin powder refilled. Follow up in 1 week.   Statin therapy: discuss starting at follow up  ACEi/ARB: Discuss at  follow up   Foot exam: Needs at follow up  Urine microalbumine: Needs at follow up  Eye exam: has scheduled for this afternoon Discussion on statin, ACEi/ARB and urine microalbumin truncated due to concern for chest pain.    Atypical chest pain EKG: HR 74, NSR, without acute ST or T wave changes, ?LVH. Pt continues to have intermittent chest pain. Referral to cardiology placed. ED precautions reviewed.   Diabetic polyneuropathy associated with type 2 diabetes mellitus (HCC) Refilled Gabapentin. Recent BMP reviewed.      Lyndee Hensen, DO PGY-2, [facility] 02/28/2021

## 2021-02-28 NOTE — Patient Instructions (Signed)
It was great seeing you today!  Please check-out at the front desk before leaving the clinic. I'd like to see you back next Friday but if you need to be seen earlier than that for any new issues we're happy to fit you in, just give [place] a call!  Visit Remembers: - Stop by the pharmacy to pick up your prescriptions  - Continue to work on your healthy eating habits and incorporating exercise into your daily life.  - Your goal is to have an BP < 120/80 - Medicine Changes: Increased insulin to 60 units daily.  Stop voltaren tablets and start voltaren gel.   Call us if: Blood sugars are >350 call the on-call provider over the weekend for additional instructions    Regarding lab work today:  Due to recent changes in healthcare laws, you may see the results of your imaging and laboratory studies on MyChart before your provider has had a chance to review them.  I understand that in some cases there may be results that are confusing or concerning to you. Not all laboratory results come back in the same time frame and you may be waiting for multiple results in order to interpret others.  Please give [place] 72 hours in order for your provider to thoroughly review all the results before contacting the office for clarification of your results. If everything is normal, you will get a letter in the mail or a message in My Chart. Please give [place] a call if you do not hear from [place] after 2 weeks.  Please bring all of your medications with you to each visit.    If you haven't already, sign up for My Chart to have easy access to your labs results, and communication with your primary care physician.  Feel free to call with any questions or concerns at any time, at [phone].   Take care,  Dr. Rushie [facility]

## 2021-03-01 LAB — BASIC METABOLIC PANEL
BUN/Creatinine Ratio: 9 (ref 9–23)
BUN: 8 mg/dL (ref 6–24)
CO2: 21 mmol/L (ref 20–29)
Calcium: 9.6 mg/dL (ref 8.7–10.2)
Chloride: 96 mmol/L (ref 96–106)
Creatinine, Ser: 0.86 mg/dL (ref 0.57–1.00)
Glucose: 346 mg/dL — ABNORMAL HIGH (ref 65–99)
Potassium: 3.9 mmol/L (ref 3.5–5.2)
Sodium: 134 mmol/L (ref 134–144)
eGFR: 81 mL/min/{1.73_m2} (ref >59)

## 2021-03-03 ENCOUNTER — Emergency Department (HOSPITAL_COMMUNITY)
Admission: EM | Admit: 2021-03-03 | Discharge: 2021-03-03 | Disposition: A | Discharge status: Home / Self Care | Payer: 59 | Attending: Emergency Medicine | Admitting: Emergency Medicine

## 2021-03-03 ENCOUNTER — Telehealth: Payer: Self-pay

## 2021-03-03 ENCOUNTER — Other Ambulatory Visit: Payer: Self-pay

## 2021-03-03 ENCOUNTER — Encounter (HOSPITAL_COMMUNITY): Payer: Self-pay

## 2021-03-03 DIAGNOSIS — I10 Essential (primary) hypertension: Secondary | ICD-10-CM | POA: Insufficient documentation

## 2021-03-03 DIAGNOSIS — R739 Hyperglycemia, unspecified: Secondary | ICD-10-CM | POA: Diagnosis present

## 2021-03-03 DIAGNOSIS — R0789 Other chest pain: Secondary | ICD-10-CM | POA: Insufficient documentation

## 2021-03-03 DIAGNOSIS — Z794 Long term (current) use of insulin: Secondary | ICD-10-CM | POA: Insufficient documentation

## 2021-03-03 DIAGNOSIS — E1165 Type 2 diabetes mellitus with hyperglycemia: Secondary | ICD-10-CM | POA: Insufficient documentation

## 2021-03-03 LAB — I-STAT CHEM 8, ED
BUN: 13 mg/dL (ref 6–20)
Calcium, Ion: 1.26 mmol/L (ref 1.15–1.40)
Chloride: 101 mmol/L (ref 98–111)
Creatinine, Ser: 0.6 mg/dL (ref 0.44–1.00)
Glucose, Bld: 479 mg/dL — ABNORMAL HIGH (ref 70–99)
HCT: 41 % (ref 36.0–46.0)
Hemoglobin: 13.9 g/dL (ref 12.0–15.0)
Potassium: 3.8 mmol/L (ref 3.5–5.1)
Sodium: 134 mmol/L — ABNORMAL LOW (ref 135–145)
TCO2: 22 mmol/L (ref 22–32)

## 2021-03-03 LAB — CBC WITH DIFFERENTIAL/PLATELET
Abs Immature Granulocytes: 0.02 10*3/uL (ref 0.00–0.07)
Basophils Absolute: 0 10*3/uL (ref 0.0–0.1)
Basophils Relative: 0 %
Eosinophils Absolute: 0.1 10*3/uL (ref 0.0–0.5)
Eosinophils Relative: 1 %
HCT: 37.8 % (ref 36.0–46.0)
Hemoglobin: 13.1 g/dL (ref 12.0–15.0)
Immature Granulocytes: 0 %
Lymphocytes Relative: 44 %
Lymphs Abs: 4 10*3/uL (ref 0.7–4.0)
MCH: 30.8 pg (ref 26.0–34.0)
MCHC: 34.7 g/dL (ref 30.0–36.0)
MCV: 88.9 fL (ref 80.0–100.0)
Monocytes Absolute: 0.5 10*3/uL (ref 0.1–1.0)
Monocytes Relative: 6 %
Neutro Abs: 4.5 10*3/uL (ref 1.7–7.7)
Neutrophils Relative %: 49 %
Platelets: 328 10*3/uL (ref 150–400)
RBC: 4.25 MIL/uL (ref 3.87–5.11)
RDW: 12.2 % (ref 11.5–15.5)
WBC: 9.1 10*3/uL (ref 4.0–10.5)
nRBC: 0 % (ref 0.0–0.2)

## 2021-03-03 LAB — URINALYSIS, ROUTINE W REFLEX MICROSCOPIC
Bilirubin Urine: NEGATIVE
Glucose, UA: >=500 mg/dL — AB
Hgb urine dipstick: NEGATIVE
Ketones, ur: NEGATIVE mg/dL
Leukocytes,Ua: NEGATIVE
Nitrite: NEGATIVE
Protein, ur: NEGATIVE mg/dL
Specific Gravity, Urine: 1.028 (ref 1.005–1.030)
pH: 6 (ref 5.0–8.0)

## 2021-03-03 LAB — BASIC METABOLIC PANEL
Anion gap: 8 (ref 5–15)
BUN: 14 mg/dL (ref 6–20)
CO2: 22 mmol/L (ref 22–32)
Calcium: 9.4 mg/dL (ref 8.9–10.3)
Chloride: 102 mmol/L (ref 98–111)
Creatinine, Ser: 0.71 mg/dL (ref 0.44–1.00)
GFR, Estimated: >60 mL/min (ref >60)
Glucose, Bld: 464 mg/dL — ABNORMAL HIGH (ref 70–99)
Potassium: 3.7 mmol/L (ref 3.5–5.1)
Sodium: 132 mmol/L — ABNORMAL LOW (ref 135–145)

## 2021-03-03 LAB — BETA-HYDROXYBUTYRIC ACID: Beta-Hydroxybutyric Acid: 0.12 mmol/L (ref 0.05–0.27)

## 2021-03-03 LAB — CBG MONITORING, ED
Glucose-Capillary: 580 mg/dL (ref 70–99)
Glucose-Capillary: 454 mg/dL — ABNORMAL HIGH (ref 70–99)
Glucose-Capillary: 365 mg/dL — ABNORMAL HIGH (ref 70–99)

## 2021-03-03 LAB — OSMOLALITY: Osmolality: 307 mOsm/kg — ABNORMAL HIGH (ref 275–295)

## 2021-03-03 MED ORDER — INSULIN ASPART 100 UNIT/ML IJ SOLN
10.0000 [IU] | Freq: Once | INTRAMUSCULAR | Status: AC
  Administered 2021-03-03: 10 [IU] via SUBCUTANEOUS
  Filled 2021-03-03: qty 0.1

## 2021-03-03 MED ORDER — LACTATED RINGERS IV BOLUS
1000.0000 mL | INTRAVENOUS | Status: AC
  Administered 2021-03-03: 1000 mL via INTRAVENOUS

## 2021-03-03 NOTE — Assessment & Plan Note (Addendum)
Uncontrolled. CBG in office 343. A1c today 11.3 and previously 5.4. Was well controlled over the past 2 years. This 11.3 is vastly different. Referral to pharmacy placed.   Advised pt to increase insulin to 60 units daily. No Ozempic samples available in clinic. Call on-call provider for CBGs >350. Encouraged continued diet rich in vegetables and complex carbs.  Heart healthy carb modified diet. Counseled on need to continue exercising. Nystatin powder refilled. Follow up in 1 week.   Statin therapy: discuss starting at follow up  ACEi/ARB: Discuss at follow up   Foot exam: Needs at follow up  Urine microalbumine: Needs at follow up  Eye exam: has scheduled for this afternoon Discussion on statin, ACEi/ARB and urine microalbumin truncated due to concern for chest pain.

## 2021-03-03 NOTE — ED Triage Notes (Signed)
Pt states frequent urination, unsteady gait, double vision. Pt states IDDM, patient states not missed any doses of insulin. Cbg-475 this am upon waking.

## 2021-03-03 NOTE — Telephone Encounter (Signed)
Patient calls nurse line reporting elevated blood sugars all weekend and today. Patient reports she has been struggling all weekend to keep blood glucose down. Patient reports today her sugars have been in the 400s, most recently 479. Patient reports she was at the grocery store and had to leave due to blurry vision. Patient advised to go to [facility] or ED due to symptoms. Patient reports she will have her son drive her. Patient has an apt at [facility] on 7/15.

## 2021-03-03 NOTE — Assessment & Plan Note (Signed)
Refilled Gabapentin. Recent BMP reviewed.

## 2021-03-03 NOTE — Assessment & Plan Note (Signed)
EKG: HR 74, NSR, without acute ST or T wave changes, ?LVH. Pt continues to have intermittent chest pain. Referral to cardiology placed. ED precautions reviewed.

## 2021-03-03 NOTE — ED Provider Notes (Signed)
Patient signed to me by Dr. Karle Starch pending results of labs.  Blood sugar has improved after fluids and she was given subcu insulin.  Has appoint to see her doctor this week   Lacretia Leigh, MD 03/03/21 1730

## 2021-03-03 NOTE — ED Provider Notes (Signed)
[facility] EMERGENCY DEPARTMENT Provider Note  CSN: [id number] Arrival date & time: 03/03/21 1230    History Chief Complaint  Patient presents with   Hyperglycemia    Isabel Berger is a 55 y.o. female with history of DM had been well controlled on Ozempic and Tresiba but the health department was supplying her with the [place] and she had to re-qualify for aid last month. She has been out of the Ozempic during the last month or so and has had poorly controlled sugars during that time. She has had increased thirst and urinary frequency with blurry vision. She was scheduled to see ophtho last week but was late for the appointment and had it rescheduled for later this week. She has not had any fever, nausea, vomiting or diarrhea.    Past Medical History:  Diagnosis Date   Allergy    pollen   Constipation    Depression    Diabetes mellitus    Hypertension     Past Surgical History:  Procedure Laterality Date   ABDOMINAL HYSTERECTOMY     FOOT SURGERY Bilateral 2008 x 3   Dr Meridee Score    TOTAL ABDOMINAL HYSTERECTOMY W/ BILATERAL SALPINGOOPHORECTOMY  2004   fibroids    Family History  Problem Relation Age of Onset   Diabetes Mother    Hypertension Mother    Stroke Mother    Breast cancer Maternal Aunt 23   Colon cancer Neg Hx    Colon polyps Neg Hx    Esophageal cancer Neg Hx    Rectal cancer Neg Hx    Stomach cancer Neg Hx     Social History   Tobacco Use   Smoking status: Never   Smokeless tobacco: Never  Vaping Use   Vaping Use: Never used  Substance Use Topics   Alcohol use: Not Currently   Drug use: No     Home Medications Prior to Admission medications   Medication Sig Start Date End Date Taking? Authorizing Provider  famotidine (PEPCID) 20 MG tablet Take 1 tablet (20 mg total) by mouth 2 (two) times daily. Patient taking differently: Take 20 mg by mouth 2 (two) times daily as needed for heartburn or indigestion. 02/23/20  Yes Anderson, Chelsey  L, DO  gabapentin (NEURONTIN) 100 MG capsule Take 1 capsule (100 mg total) by mouth 2 (two) times daily AND 2-3 capsules (200-300 mg total) at bedtime. Take one by mouth in AM and one mid day and three at bedtime. Patient taking differently: Take 1 capsule (100 mg total) by mouth 2 (two) times daily AND 2-3 capsules (200-300 mg total) at bedtime. 02/28/21  Yes Brimage, Vondra, DO  insulin degludec (TRESIBA FLEXTOUCH) 200 UNIT/ML FlexTouch Pen Inject 54 Units into the skin daily. Patient taking differently: Inject 60 Units into the skin daily. 01/27/21  Yes Anderson, Chelsey L, DO  Lidocaine-Adhesive Sheets (LIDOPURE PATCH) 5 % KIT Apply 1 patch topically every 12 (twelve) hours as needed. Patient taking differently: Apply 1 patch topically every 12 (twelve) hours as needed (pain). 01/02/21  Yes Wilber Oliphant, MD  nystatin (MYCOSTATIN/NYSTOP) powder Apply topically 4 (four) times daily. 02/28/21  Yes Brimage, Vondra, DO  OZEMPIC, 1 MG/DOSE, 4 MG/3ML SOPN INJECT 0.75MLS (1MG TOTAL) INTO THE SKIN ONCE A WEEK. Patient taking differently: Inject 1 mg into the skin once a week. . On Wednesday 12/19/20  Yes Anderson, Chelsey L, DO  diclofenac Sodium (VOLTAREN) 1 % GEL Apply 4 g topically 4 (four) times daily. 02/28/21  Brimage, Vondra, DO  glucose blood (FREESTYLE TEST STRIPS) test strip Use to check glucose three times daily 06/10/18   Andrena Mews T, MD  glucose monitoring kit (FREESTYLE) monitoring kit 1 each by Does not apply route 3 (three) times daily. Use to check glucose three times daily 06/10/18   Andrena Mews T, MD  Insulin Pen Needle (PEN NEEDLES) 32G X 4 MM MISC 1 pen by Does not apply route daily. 09/22/18   Zenia Resides, MD  Lancets (FREESTYLE) lancets Use to check glucose three times daily 07/14/18   Lovenia Kim, MD     Allergies    Patient has no known allergies.   Review of Systems   Review of Systems A comprehensive review of systems was completed and negative except as noted  in HPI.    Physical Exam BP (!) 133/92   Pulse 85   Temp 98.9 F (37.2 C) (Oral)   Resp 16   Ht '5\' 7"'  (1.702 m)   Wt 95.3 kg   SpO2 95%   BMI 32.89 kg/m   Physical Exam Vitals and nursing note reviewed.  Constitutional:      Appearance: Normal appearance.  HENT:     Head: Normocephalic and atraumatic.     Nose: Nose normal.     Mouth/Throat:     Mouth: Mucous membranes are moist.  Eyes:     Extraocular Movements: Extraocular movements intact.     Conjunctiva/sclera: Conjunctivae normal.  Cardiovascular:     Rate and Rhythm: Normal rate.  Pulmonary:     Effort: Pulmonary effort is normal.     Breath sounds: Normal breath sounds.  Abdominal:     General: Abdomen is flat.     Palpations: Abdomen is soft.     Tenderness: There is no abdominal tenderness.  Musculoskeletal:        General: No swelling. Normal range of motion.     Cervical back: Neck supple.  Skin:    General: Skin is warm and dry.  Neurological:     General: No focal deficit present.     Mental Status: She is alert.  Psychiatric:        Mood and Affect: Mood normal.     ED Results / Procedures / Treatments   Labs (all labs ordered are listed, but only abnormal results are displayed) Labs Reviewed  CBG MONITORING, ED - Abnormal; Notable for the following components:      Result Value   Glucose-Capillary 580 (*)    All other components within normal limits  BASIC METABOLIC PANEL  BASIC METABOLIC PANEL  BASIC METABOLIC PANEL  BASIC METABOLIC PANEL  OSMOLALITY  CBC WITH DIFFERENTIAL/PLATELET  URINALYSIS, ROUTINE W REFLEX MICROSCOPIC  BETA-HYDROXYBUTYRIC ACID  BETA-HYDROXYBUTYRIC ACID  I-STAT BETA HCG BLOOD, ED ([facility], [facility], [facility] ONLY)  CBG MONITORING, ED    EKG None  Radiology No results found.  Procedures Procedures  Medications Ordered in the ED Medications  lactated ringers bolus 1,000 mL (has no administration in time range)     MDM Rules/Calculators/A&P MDM Will check labs,  begin IVF and treat based on results.   ED Course  I have reviewed the triage vital signs and the nursing notes.  Pertinent labs & imaging results that were available during my care of the patient were reviewed by me and considered in my medical decision making (see chart for details).  Clinical Course as of 03/04/21 0818  Mon Mar 03, 2021  1547 Care of the patient signed  out to Dr. Zenia Resides at the change of shift.  [CS]    Clinical Course User Index [CS] Truddie Hidden, MD    Final Clinical Impression(s) / ED Diagnoses Final diagnoses:  None    Rx / DC Orders ED Discharge Orders     None        Truddie Hidden, MD 03/04/21 [phone]

## 2021-03-06 LAB — HM DIABETES EYE EXAM

## 2021-03-07 ENCOUNTER — Encounter: Payer: Self-pay | Admitting: Family Medicine

## 2021-03-07 ENCOUNTER — Other Ambulatory Visit: Payer: Self-pay

## 2021-03-07 ENCOUNTER — Ambulatory Visit (INDEPENDENT_AMBULATORY_CARE_PROVIDER_SITE_OTHER): Payer: 59

## 2021-03-07 ENCOUNTER — Ambulatory Visit (INDEPENDENT_AMBULATORY_CARE_PROVIDER_SITE_OTHER): Payer: 59 | Admitting: Family Medicine

## 2021-03-07 VITALS — BP 142/86 | HR 91 | Ht 67.0 in | Wt 210.8 lb

## 2021-03-07 DIAGNOSIS — Z23 Encounter for immunization: Secondary | ICD-10-CM | POA: Diagnosis not present

## 2021-03-07 DIAGNOSIS — Z794 Long term (current) use of insulin: Secondary | ICD-10-CM

## 2021-03-07 DIAGNOSIS — E11620 Type 2 diabetes mellitus with diabetic dermatitis: Secondary | ICD-10-CM

## 2021-03-07 LAB — GLUCOSE, POCT (MANUAL RESULT ENTRY): POC Glucose: 365 mg/dl — AB (ref 70–99)

## 2021-03-07 NOTE — Patient Instructions (Signed)
For your diabetes:  It was great seeing you today!   Take 10 extra units of insulin tonight  Tomorrow morning take 40 units of insulin Check sugar in the evening if >350 take additional 40 units, if less than 350 take 30 units    If you have questions or concerns please do not hesitate to call at [phone].  Dr. [facility]

## 2021-03-07 NOTE — Progress Notes (Addendum)
SUBJECTIVE:   CHIEF COMPLAINT / HPI:   Isabel Berger is a 55 year old female here for f/u on her T2DM and blood glucose control. She presented to the ED on Monday because she was unable to lower her blood glucose below 600 for over a day.  Diabetes Since discharge from the ED, the patient has been monitoring her blood glucose a few times a day and has averaged approximately 350-400. She has had 2 measurements over 600 and more over 350. Today, she measured 328 when she woke up (after her 60 units of Tresiba) and 489 after eating a breakfast of 1 biscuit and egg with cheese.  She reports increased urination (wakes up 10+ times a night), dry mouth, drinking a "24 pack of water bottles" a day, increased thirst, tingling in both of her hands and feet, and cramps in her feet.  She takes 60 units of Tresiba every morning She also normally takes 1 mg Ozempic weekly on Wednesdays, but has been unable to obtain it from the Health Department for the past 5 weeks. She submitted paperwork to the Health Department to get her medication refilled and is now waiting to hear back from them (ETA unclear).  ROS Positive for SOB, lightheadedness/dizziness when standing, and sleeps with 1 pillow under her back.  Pertinent negatives: No fevers, chills, weight loss, changes in bowel movements, or joint aches.  PERTINENT  PMH / PSH:   Pertinent Medications: - Ozempic 1 mg/week on Wednesday (currently unable to obtain) - 60 units of Tresiba daily in the morning  OBJECTIVE:   BP (!) 142/86   Pulse 91   Ht 5\' 7"  (1.702 m)   Wt 210 lb 12.8 oz (95.6 kg)   SpO2 98%   BMI 33.02 kg/m    General: age-appropriate, resting comfortably in chair, NAD, WNWD, alert and at baseline HENT: moist mucus membranes, no oral lesions, normal dentition  Cardiovascular: Regular rate and rhythm. Normal S1/S2. No murmurs, rubs, or gallops appreciated. 2+ radial pulses. Pulmonary: Clear bilaterally to ascultation. No  increased WOB, no accessory muscle usage. No wheezes, rales, or crackles. Extremities: 2+ bipedal edema up to the knees Skin: warm, dry, normal skin turgor  Psych: Pleasant and appropriate  ASSESSMENT/PLAN:   T2DM (type 2 diabetes mellitus) ([place]) Uncontrolled. The lack of blood glucose control is concerning and must be addressed. Instructed patient to take 10 extra units of Tresiba insulin tonight. Tomorrow morning, she will take 40 units of insulin. She will then recheck her blood glucose in the evening and if it is >350, she will take an additional 40 units. If it is <350, she will take 30 additional units.   If her blood glucose continues to be over 350 throughout the day, she will call the [facility] for insulin dosing for instructions or go to ED if meter reads "high". Provided clear instructions about when to call with concerns or elevated blood glucose values.  It is hoped that the Ozempic from the Health Department will help control the blood glucose values when she is able to get it. Recommend starting meal time coverage if unable to restart GLP-1 soon.   Follow up on Monday with clinical pharmacist.   [place] OF STUDENT NOTE   I personally evaluated this patient along with the student, and verified all aspects of the history, physical exam, and medical decision making as documented by the student. I  agree with the student's documentation and have made all necessary edits.  Lyndee Hensen, DO PGY-3, [facility] 03/07/2021

## 2021-03-07 NOTE — Progress Notes (Deleted)
   SUBJECTIVE:   CHIEF COMPLAINT / HPI:   No chief complaint on file.    Isabel Berger is a 55 y.o. female here for ***   Pt reports ***    PERTINENT  PMH / PSH: reviewed and updated as appropriate   OBJECTIVE:   There were no vitals taken for this visit.  ***  ASSESSMENT/PLAN:   No problem-specific Assessment & Plan notes found for this encounter.     Lyndee Hensen, DO PGY-2, [facility] 03/07/2021      {    This will disappear when note is signed, click to select method of visit    :1}

## 2021-03-07 NOTE — Assessment & Plan Note (Addendum)
Uncontrolled. The lack of blood glucose control is concerning and must be addressed. Instructed patient to take 10 extra units of Tresiba insulin tonight. Tomorrow morning, she will take 40 units of insulin. She will then recheck her blood glucose in the evening and if it is >350, she will take an additional 40 units. If it is <350, she will take 30 additional units.   If her blood glucose continues to be over 350 throughout the day, she will call the [facility] for insulin dosing for instructions or go to ED if meter reads "high". Provided clear instructions about when to call with concerns or elevated blood glucose values.  It is hoped that the Ozempic from the Health Department will help control the blood glucose values when she is able to get it. Recommend starting meal time coverage if unable to restart GLP-1 soon.   Follow up on Monday with clinical pharmacist.

## 2021-03-10 ENCOUNTER — Ambulatory Visit (INDEPENDENT_AMBULATORY_CARE_PROVIDER_SITE_OTHER): Payer: 59 | Admitting: Pharmacist

## 2021-03-10 ENCOUNTER — Other Ambulatory Visit: Payer: Self-pay

## 2021-03-10 DIAGNOSIS — E11620 Type 2 diabetes mellitus with diabetic dermatitis: Secondary | ICD-10-CM

## 2021-03-10 DIAGNOSIS — Z794 Long term (current) use of insulin: Secondary | ICD-10-CM | POA: Diagnosis not present

## 2021-03-10 MED ORDER — METFORMIN HCL 500 MG PO TABS: 500.0000 mg | ORAL_TABLET | Freq: Every day | ORAL | 0 refills | Status: DC

## 2021-03-10 MED ORDER — METFORMIN HCL 1000 MG PO TABS: 1000.0000 mg | ORAL_TABLET | Freq: Two times a day (BID) | ORAL | 0 refills | Status: DC

## 2021-03-10 NOTE — Progress Notes (Signed)
Subjective:    Patient ID: Isabel Berger, female    DOB: 1966-01-24, 55 y.o.   MRN: [id number]  HPI Patient is a 55 y.o. female who presents for diabetes management. She is in good spirits and presents without assistance. Patient was referred on 03/03/21 and last seen by provider, Dr. Susa Simmonds, on 03/07/21.  Patient reports that she has noticed an improvement in her blood glucose readings since her appt on 03/07/21.  Insurance coverage/medication affordability: [employer]  Family/Social history: Works for [employer]  Current diabetes medications include: insulin degludec Tyler Aas) 40 in the mornings and if >350 40 units and if <350 30 units, semaglutide (Ozempic) 1mg  on Sunday Patient states that She is taking her medications as prescribed. Patient reports adherence with medications.   Do you feel that your medications are working for you?  yes  Have you been experiencing any side effects to the medications prescribed? no  Do you have any problems obtaining medications due to transportation or finances?  Yes; Waiting on Ozempic and T[facility]   Patient denies hypoglycemic events. Patient reports polyuria (increased urination).  Patient reports polyphagia (increased appetite).  Patient reports polydipsia (increased thirst).  Patient denies neuropathy (nerve pain). Patient denies visual changes. Patient reports self foot exams.   Home fasting blood sugars: 161 (this am), 300's (on Saturday) 2 hour post-meal/random blood sugars: 279 (this PM) and reports she ate a taco and nachos with cheese for lunch.  Objective:   Labs:   Physical Exam Neurological:     Mental Status: She is alert and oriented to person, place, and time.    Review of Systems  Respiratory:  Positive for shortness of breath.   Cardiovascular:  Positive for leg swelling. Negative for chest pain.       No changes to leg swelling - patient states this is ongoing and referral for  cardiologist was put in and patient has appt next month  Genitourinary:        Itching   Lab Results  Component Value Date   HGBA1C 11.3 (A) 02/28/2021   HGBA1C 5.4 11/04/2020   HGBA1C 5.7 (A) 07/16/2020    Lipid Panel     Component Value Date/Time   CHOL 203 (H) 06/10/2018 1038   TRIG 114 06/10/2018 1038   HDL 42 06/10/2018 1038   CHOLHDL 4.8 (H) 06/10/2018 1038   CHOLHDL 4.5 06/21/2014 1114   VLDL 22 06/21/2014 1114   LDLCALC 138 (H) 06/10/2018 1038   Assessment/Plan:   T2DM is not controlled likely due to patient needing further optimization in therapy. Medication adherence appears optimal. Additional pharmacotherapy is warranted. Patient was taking metformin at one time with no side effects and is unsure of why this was discontinued. Will re-start patient on metformin 500mg  once daily and titrate up to 500mg  BID after 3 days. Will consider further titration at follow-up visit pending tolerability. In the future can consider initiating SGLT2 if patient needs further glycemic control but would like to have better control before doing so as not to increase patient's likelihood of yeast infections. Will combine Tresiba back into one daily dose of 70 units with instructions to self-titrate. Patient educated on purpose, proper use and potential adverse effects of metformin.  Following instruction patient verbalized understanding of treatment plan.    Continued basal insulin degludec Tyler Aas) 70 units once daily. Patient will continue to titrate 1 unit every day if fasting blood sugar > 150 mg/dl until fasting blood sugars reach goal  or next visit.  Continued GLP-1 semaglutide (Ozempic) 1mg  once weekly.  Extensively discussed pathophysiology of diabetes, dietary effects on blood sugar control, and recommended lifestyle interventions. Counseled on s/sx of and management of hypoglycemia Next A1C anticipated October 2022.   Follow-up appointment one week to review sugar readings. Written  patient instructions provided.  This appointment required 35 minutes of direct patient care.  Thank you for involving pharmacy to assist in providing this patient's care.

## 2021-03-10 NOTE — Assessment & Plan Note (Signed)
T2DM is not controlled likely due to patient needing further optimization in therapy. Medication adherence appears optimal. Additional pharmacotherapy is warranted. Patient was taking metformin at one time with no side effects and is unsure of why this was discontinued. Will re-start patient on metformin 500mg  once daily and titrate up to 500mg  BID after 3 days. Will consider further titration at follow-up visit pending tolerability. In the future can consider initiating SGLT2 if patient needs further glycemic control but would like to have better control before doing so as not to increase patient's likelihood of yeast infections. Will combine Tresiba back into one daily dose of 70 units with instructions to self-titrate. Patient educated on purpose, proper use and potential adverse effects of metformin.  Following instruction patient verbalized understanding of treatment plan.    1. Continued basal insulin degludec Tyler Aas) 70 units once daily. Patient will continue to titrate 1 unit every day if fasting blood sugar > 150 mg/dl until fasting blood sugars reach goal or next visit.  2. Continued GLP-1 semaglutide (Ozempic) 1mg  once weekly.  3. Extensively discussed pathophysiology of diabetes, dietary effects on blood sugar control, and recommended lifestyle interventions. 4. Counseled on s/sx of and management of hypoglycemia 5. Next A1C anticipated October 2022.

## 2021-03-10 NOTE — Patient Instructions (Signed)
Ms. Nathaniel it was a pleasure seeing you today.   Please do the following:  Begin taking metformin 500mg  once a day for 3 days then increase to one tablet twice a day with meals as directed today during your appointment. If you have any questions or if you believe something has occurred because of this change, call me or your doctor to let one of [place] know.  Take 20 units of Tresiba this evening and 60 units of Tresiba tomorrow morning. On the second day (Wednesday) we will switch to your new Tresiba dose which will be 70 units once daily in the morning. Please increase this by one unit every time your blood glucose in the morning is >150.  Continue checking blood sugars at home. It's really important that you record these and bring these in to your next doctor's appointment.  Continue making the lifestyle changes we've discussed together during our visit. Diet and exercise play a significant role in improving your blood sugars.  Follow-up with me in one week.   Hypoglycemia or low blood sugar:   Low blood sugar can happen quickly and may become an emergency if not treated right away.   While this shouldn't happen often, it can be brought upon if you skip a meal or do not eat enough. Also, if your insulin or other diabetes medications are dosed too high, this can cause your blood sugar to go to low.   Warning signs of low blood sugar include: Feeling shaky or dizzy Feeling weak or tired  Excessive hunger Feeling anxious or upset  Sweating even when you aren't exercising  What to do if I experience low blood sugar? Follow the Rule of 15 Check your blood sugar with your meter. If lower than 70, proceed to step 2.  Treat with 15 grams of fast acting carbs which is found in 3-4 glucose tablets. If none are available you can try hard candy, 1 tablespoon of sugar or honey,4 ounces of fruit juice, or 6 ounces of REGULAR soda.  Re-check your sugar in 15 minutes. If it is still below 70, do what you  did in step 2 again. If your blood sugar has come back up, go ahead and eat a snack or small meal made up of complex carbs (ex. Whole grains) and protein at this time to avoid recurrence of low blood sugar.

## 2021-03-13 ENCOUNTER — Ambulatory Visit (HOSPITAL_COMMUNITY)
Admission: EM | Admit: 2021-03-13 | Discharge: 2021-03-13 | Disposition: A | Discharge status: Home / Self Care | Payer: 59 | Attending: Emergency Medicine | Admitting: Emergency Medicine

## 2021-03-13 ENCOUNTER — Encounter (HOSPITAL_COMMUNITY): Payer: Self-pay | Admitting: Emergency Medicine

## 2021-03-13 ENCOUNTER — Other Ambulatory Visit: Payer: Self-pay

## 2021-03-13 DIAGNOSIS — M5441 Lumbago with sciatica, right side: Secondary | ICD-10-CM | POA: Diagnosis not present

## 2021-03-13 MED ORDER — KETOROLAC TROMETHAMINE 60 MG/2ML IM SOLN
INTRAMUSCULAR | Status: AC
  Filled 2021-03-13: qty 2

## 2021-03-13 MED ORDER — NAPROXEN 500 MG PO TABS: 500.0000 mg | ORAL_TABLET | Freq: Two times a day (BID) | ORAL | 0 refills | Status: DC

## 2021-03-13 MED ORDER — KETOROLAC TROMETHAMINE 60 MG/2ML IM SOLN
60.0000 mg | Freq: Once | INTRAMUSCULAR | Status: AC
Start: 2021-03-13 — End: 2021-03-13
  Administered 2021-03-13: 60 mg via INTRAMUSCULAR

## 2021-03-13 NOTE — ED Triage Notes (Signed)
Complains of sciatic pain in right back and leg.  History of the same.  Pain started today.  No known injury
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
## 2021-03-13 NOTE — Discharge Instructions (Addendum)
Take naproxen twice a day with food for 5 days then as needed  Continue use of heat in 15 minute intervals, can attempt warm soak  Gentle stretching as tolerated  Follow up with orthopedics, info below, for evaluation and long term management

## 2021-03-17 ENCOUNTER — Encounter: Payer: Self-pay | Admitting: Cardiology

## 2021-03-18 ENCOUNTER — Ambulatory Visit (INDEPENDENT_AMBULATORY_CARE_PROVIDER_SITE_OTHER): Payer: 59 | Admitting: Pharmacist

## 2021-03-18 ENCOUNTER — Other Ambulatory Visit: Payer: Self-pay

## 2021-03-18 DIAGNOSIS — Z794 Long term (current) use of insulin: Secondary | ICD-10-CM

## 2021-03-18 DIAGNOSIS — E11620 Type 2 diabetes mellitus with diabetic dermatitis: Secondary | ICD-10-CM | POA: Diagnosis not present

## 2021-03-18 NOTE — Progress Notes (Signed)
Subjective:    Patient ID: Isabel Berger, female    DOB: 07-17-1966, 55 y.o.   MRN: [id number]  HPI Patient is a 55 y.o. female who presents for diabetes management. She is in good spirits and presents without assistance. Patient was referred on 03/03/21 and last seen by provider, Dr. Susa Simmonds, on 03/07/21. Last seen in pharmacy clinic on 03/10/21.  Patient reports that over the last week or so she has had a decreased appetite that was also associated with some nausea. She did recently resume her Ozempic '1mg'$  dose after being out of the medication for a couple weeks.  Insurance coverage/medication affordability: [employer]  Family/Social history: Works for [employer]  Current diabetes medications include: insulin degludec Tyler Aas) 72 units once daily in morning semaglutide (Ozempic) '1mg'$  on Sunday, metformin '1000mg'$  BID Patient states that She is taking her medications as prescribed. Patient reports adherence with medications.   Do you feel that your medications are working for you?  yes  Have you been experiencing any side effects to the medications prescribed? no  Do you have any problems obtaining medications due to transportation or finances?  Yes; Waiting on [facility]empic and T[facility]   Patient denies hypoglycemic events. Patient denies polyuria (increased urination).  Patient denies polyphagia (increased appetite).  Patient denies polydipsia (increased thirst).  Patient denies neuropathy (nerve pain). Patient denies visual changes. Patient reports self foot exams.    03/11/21   Fasting 212  Lunch 139  Bedtime 195  03/12/21   Fasting 199  Lunch 147  Bedtime 117  03/13/21   Fasting 134  Bedtime 139  03/14/21   Fasting 94  Dinner 86  Bedtime 204  03/15/21   Fasting 87  Dinner 190  Bedtime 120  03/16/21   Fasting 99  Bedtime 140  7/25   Fasting 91  Bedtime 129  03/18/21   Fasting 77    Objective:   Labs:   Physical Exam Neurological:      Mental Status: She is alert and oriented to person, place, and time.    Review of Systems  Respiratory:  Positive for shortness of breath.   Cardiovascular:  Negative for chest pain and leg swelling.       Referral for cardiologist was put in and patient has appt next month  Gastrointestinal:  Positive for nausea. Negative for abdominal pain, diarrhea and vomiting.   Lab Results  Component Value Date   HGBA1C 11.3 (A) 02/28/2021   HGBA1C 5.4 11/04/2020   HGBA1C 5.7 (A) 07/16/2020    Lipid Panel     Component Value Date/Time   CHOL 203 (H) 06/10/2018 1038   TRIG 114 06/10/2018 1038   HDL 42 06/10/2018 1038   CHOLHDL 4.8 (H) 06/10/2018 1038   CHOLHDL 4.5 06/21/2014 1114   VLDL 22 06/21/2014 1114   LDLCALC 138 (H) 06/10/2018 1038   Assessment/Plan:   T2DM control is improving likely due to patient needing further optimization in therapy. Medication adherence appears optimal. Additional pharmacotherapy is warranted. Patient tolerating maximum dose metformin with no issues. Will back patient down on Ozempic '1mg'$  to Ozempic 0.'5mg'$  as her nausea is likely associated with no longer having a built up tolerance to medication after being off it for several weeks. Will also decrease her Tresiba from 72 units to 68 units once daily as patient had a blood glucose <80 this morning. In the future can consider initiating SGLT2 if patient needs further glycemic control but would like to  have better control before doing so as not to increase patient's likelihood of yeast infections. Following instruction patient verbalized understanding of treatment plan.    Decreased basal insulin degludec Tyler Aas) to 68 units once daily.  Decreased GLP-1 semaglutide (Ozempic) '1mg'$  to 0.'5mg'$  (37 clicks) once weekly on Sundays. Extensively discussed pathophysiology of diabetes, dietary effects on blood sugar control, and recommended lifestyle interventions. Counseled on s/sx of and management of hypoglycemia Next  A1C anticipated October 2022.   Follow-up appointment in two weeks to review sugar readings. Written patient instructions provided.  This appointment required 30 minutes of direct patient care.  Thank you for involving pharmacy to assist in providing this patient's care.

## 2021-03-18 NOTE — Patient Instructions (Signed)
Ms. Bilbro it was a pleasure seeing you today.   Please do the following:  Take Ozempic '1mg'$  but only dial to 37 clicks as directed today during your appointment. Decrease Tresiba to 68 units once daily. If you have any questions or if you believe something has occurred because of this change, call me or your doctor to let one of [place] know.  Continue checking blood sugars at home. It's really important that you record these and bring these in to your next doctor's appointment.  Continue making the lifestyle changes we've discussed together during our visit. Diet and exercise play a significant role in improving your blood sugars.  Follow-up with me in two weeks.   Hypoglycemia or low blood sugar:   Low blood sugar can happen quickly and may become an emergency if not treated right away.   While this shouldn't happen often, it can be brought upon if you skip a meal or do not eat enough. Also, if your insulin or other diabetes medications are dosed too high, this can cause your blood sugar to go to low.   Warning signs of low blood sugar include: Feeling shaky or dizzy Feeling weak or tired  Excessive hunger Feeling anxious or upset  Sweating even when you aren't exercising  What to do if I experience low blood sugar? Follow the Rule of 15 Check your blood sugar with your meter. If lower than 70, proceed to step 2.  Treat with 15 grams of fast acting carbs which is found in 3-4 glucose tablets. If none are available you can try hard candy, 1 tablespoon of sugar or honey,4 ounces of fruit juice, or 6 ounces of REGULAR soda.  Re-check your sugar in 15 minutes. If it is still below 70, do what you did in step 2 again. If your blood sugar has come back up, go ahead and eat a snack or small meal made up of complex carbs (ex. Whole grains) and protein at this time to avoid recurrence of low blood sugar.

## 2021-03-18 NOTE — Assessment & Plan Note (Signed)
T2DM control is improving likely due to patient needing further optimization in therapy. Medication adherence appears optimal. Additional pharmacotherapy is warranted. Patient tolerating maximum dose metformin with no issues. Will back patient down on Ozempic '1mg'$  to Ozempic 0.'5mg'$  as her nausea is likely associated with no longer having a built up tolerance to medication after being off it for several weeks. Will also decrease her Tresiba from 72 units to 68 units once daily as patient had a blood glucose <80 this morning. In the future can consider initiating SGLT2 if patient needs further glycemic control but would like to have better control before doing so as not to increase patient's likelihood of yeast infections. Following instruction patient verbalized understanding of treatment plan.    1. Decreased basal insulin degludec Tyler Aas) to 68 units once daily.  2. Decreased GLP-1 semaglutide (Ozempic) '1mg'$  to 0.'5mg'$  (37 clicks) once weekly on Sundays. 3. Extensively discussed pathophysiology of diabetes, dietary effects on blood sugar control, and recommended lifestyle interventions. 4. Counseled on s/sx of and management of hypoglycemia 5. Next A1C anticipated October 2022.   Follow-up appointment in two weeks to review sugar readings. Written patient instructions provided.

## 2021-03-26 ENCOUNTER — Telehealth: Payer: Self-pay | Admitting: *Deleted

## 2021-03-26 DIAGNOSIS — E11620 Type 2 diabetes mellitus with diabetic dermatitis: Secondary | ICD-10-CM

## 2021-03-26 DIAGNOSIS — Z794 Long term (current) use of insulin: Secondary | ICD-10-CM

## 2021-03-26 NOTE — Telephone Encounter (Signed)
Patient called requesting a referral to see endocrinology.  She is concerned about her diabetes and would like to see a specialist.  Will forward to MD.  Isabel Berger

## 2021-03-31 NOTE — Telephone Encounter (Signed)
Referral placed.   Gerlene Fee, DO 03/31/2021, 5:21 PM PGY-3, [place]

## 2021-04-03 ENCOUNTER — Other Ambulatory Visit: Payer: Self-pay | Admitting: Student in an Organized Health Care Education/Training Program

## 2021-04-03 DIAGNOSIS — E119 Type 2 diabetes mellitus without complications: Secondary | ICD-10-CM

## 2021-04-08 ENCOUNTER — Ambulatory Visit: Payer: 59 | Admitting: Cardiology

## 2021-04-08 ENCOUNTER — Other Ambulatory Visit: Payer: Self-pay

## 2021-04-08 ENCOUNTER — Ambulatory Visit (INDEPENDENT_AMBULATORY_CARE_PROVIDER_SITE_OTHER): Payer: 59 | Admitting: Pharmacist

## 2021-04-08 DIAGNOSIS — E11620 Type 2 diabetes mellitus with diabetic dermatitis: Secondary | ICD-10-CM

## 2021-04-08 DIAGNOSIS — Z794 Long term (current) use of insulin: Secondary | ICD-10-CM | POA: Diagnosis not present

## 2021-04-08 MED ORDER — FREESTYLE TEST VI STRP: ORAL_STRIP | 0 refills | Status: AC

## 2021-04-08 MED ORDER — OZEMPIC (0.25 OR 0.5 MG/DOSE) 2 MG/1.5ML ~~LOC~~ SOPN: 0.2500 mg | PEN_INJECTOR | SUBCUTANEOUS | 1 refills | Status: DC

## 2021-04-08 MED ORDER — METFORMIN HCL ER 500 MG PO TB24: 500.0000 mg | ORAL_TABLET | Freq: Two times a day (BID) | ORAL | 0 refills | Status: DC

## 2021-04-08 NOTE — Progress Notes (Signed)
Subjective:    Patient ID: Isabel Berger, female    DOB: 1966/01/30, 56 y.o.   MRN: [id number]  HPI Patient is a 55 y.o. female who presents for diabetes management. She is in good spirits and presents without assistance. Patient was referred on 03/03/21 and last seen by provider, Dr. Susa Simmonds, on 03/07/21. Last seen in pharmacy clinic on 03/18/21.  Patient reports continued nausea despite reducing dose of Ozempic to 0.'5mg'$  over the past two weeks. She also states that recently metformin has been "tearing her stomach up" despite previously tolerating well.  Insurance coverage/medication affordability: [employer]  Family/Social history: Works for [employer]  Current diabetes medications include: insulin degludec Tyler Aas) 68 units once daily in morning semaglutide (Ozempic) 0.'5mg'$  on Sunday, metformin '1000mg'$  BID Patient states that She is taking her medications as prescribed. Patient reports adherence with medications.   Do you feel that your medications are working for you?  yes  Have you been experiencing any side effects to the medications prescribed? yes  Do you have any problems obtaining medications due to transportation or finances?  no   Patient reports hypoglycemic events based on glucometer but states she did not feel any different and was unaware Patient denies polyuria (increased urination).  Patient denies polyphagia (increased appetite).  Patient denies polydipsia (increased thirst).  Patient denies neuropathy (nerve pain). Patient denies visual changes. Patient reports self foot exams.   Fasting: 65 - 180; typically <100  Objective:   Labs:   Physical Exam Neurological:     Mental Status: She is alert and oriented to person, place, and time.    Review of Systems  Respiratory:  Positive for shortness of breath.   Cardiovascular:  Negative for chest pain and leg swelling.       Referral for cardiologist was put in and patient has appt this month   Gastrointestinal:  Positive for nausea. Negative for abdominal pain, diarrhea and vomiting.   Lab Results  Component Value Date   HGBA1C 11.3 (A) 02/28/2021   HGBA1C 5.4 11/04/2020   HGBA1C 5.7 (A) 07/16/2020    Lipid Panel     Component Value Date/Time   CHOL 203 (H) 06/10/2018 1038   TRIG 114 06/10/2018 1038   HDL 42 06/10/2018 1038   CHOLHDL 4.8 (H) 06/10/2018 1038   CHOLHDL 4.5 06/21/2014 1114   VLDL 22 06/21/2014 1114   LDLCALC 138 (H) 06/10/2018 1038   Assessment/Plan:   T2DM control is improving based on home blood glucose readings. Medication adherence appears optimal. Will back down to Ozempic 0.'25mg'$  once weekly due to nausea and allow patient time to tolerate medication again before titrating back up. Will switch metformin to XR '500mg'$  BID to see if diarrhea improves. Will also decrease her Tresiba from 68 units to 65 units once daily as patient had several morning hypoglycemic events. At next visit will assess side effects and consider initiating SGLT2 at that time. Following instruction patient verbalized understanding of treatment plan.    Decreased basal insulin degludec Tyler Aas) to 65 units once daily.  Decreased GLP-1 semaglutide (Ozempic) to 0.'25mg'$  once weekly on Sundays. Decreased metformin from '1000mg'$  BID to '500mg'$  BID and changed dosage form to extended release Extensively discussed pathophysiology of diabetes, dietary effects on blood sugar control, and recommended lifestyle interventions. Counseled on s/sx of and management of hypoglycemia Next A1C anticipated October 2022.   Follow-up appointment in four weeks to review sugar readings. Written patient instructions provided.  This appointment required 30 minutes of  direct patient care.  Thank you for involving pharmacy to assist in providing this patient's care.

## 2021-04-08 NOTE — Patient Instructions (Addendum)
Isabel Berger it was a pleasure seeing you today.   Please do the following:  Decrease Tresiba to 65 units once daily as directed today during your appointment. I also sent in a new prescription for metformin '500mg'$  extended release twice a day and Ozempic 0.'25mg'$  once weekly. If you have any questions or if you believe something has occurred because of this change, call me or your doctor to let one of [place] know.  Continue checking blood sugars at home. It's really important that you record these and bring these in to your next doctor's appointment.  Continue making the lifestyle changes we've discussed together during our visit. Diet and exercise play a significant role in improving your blood sugars.  Follow-up with me in on Tuesday September 13th at 9:30 AM   Hypoglycemia or low blood sugar:   Low blood sugar can happen quickly and may become an emergency if not treated right away.   While this shouldn't happen often, it can be brought upon if you skip a meal or do not eat enough. Also, if your insulin or other diabetes medications are dosed too high, this can cause your blood sugar to go to low.   Warning signs of low blood sugar include: Feeling shaky or dizzy Feeling weak or tired  Excessive hunger Feeling anxious or upset  Sweating even when you aren't exercising  What to do if I experience low blood sugar? Follow the Rule of 15 Check your blood sugar with your meter. If lower than 70, proceed to step 2.  Treat with 15 grams of fast acting carbs which is found in 3-4 glucose tablets. If none are available you can try hard candy, 1 tablespoon of sugar or honey,4 ounces of fruit juice, or 6 ounces of REGULAR soda.  Re-check your sugar in 15 minutes. If it is still below 70, do what you did in step 2 again. If your blood sugar has come back up, go ahead and eat a snack or small meal made up of complex carbs (ex. Whole grains) and protein at this time to avoid recurrence of low blood  sugar.

## 2021-04-09 NOTE — Assessment & Plan Note (Signed)
T2DM control is improving based on home blood glucose readings. Medication adherence appears optimal. Will back down to Ozempic 0.'25mg'$  once weekly due to nausea and allow patient time to tolerate medication again before titrating back up. Will switch metformin to XR '500mg'$  BID to see if diarrhea improves. Will also decrease her Tresiba from 68 units to 65 units once daily as patient had several morning hypoglycemic events. At next visit will assess side effects and consider initiating SGLT2 at that time. Following instruction patient verbalized understanding of treatment plan.    1. Decreased basal insulin degludec Tyler Aas) to 65 units once daily.  2. Decreased GLP-1 semaglutide (Ozempic) to 0.'25mg'$  once weekly on Sundays. 3. Decreased metformin from '1000mg'$  BID to '500mg'$  BID and changed dosage form to extended release 4. Extensively discussed pathophysiology of diabetes, dietary effects on blood sugar control, and recommended lifestyle interventions. 5. Counseled on s/sx of and management of hypoglycemia 6. Next A1C anticipated October 2022.   Follow-up appointment in four weeks to review sugar readings. Written patient instructions provided.

## 2021-05-19 ENCOUNTER — Other Ambulatory Visit: Payer: Self-pay | Admitting: Family Medicine

## 2021-05-23 ENCOUNTER — Ambulatory Visit: Payer: 59 | Admitting: Cardiovascular Disease

## 2021-05-28 ENCOUNTER — Other Ambulatory Visit: Payer: Self-pay | Admitting: Family Medicine

## 2021-05-28 DIAGNOSIS — E119 Type 2 diabetes mellitus without complications: Secondary | ICD-10-CM

## 2021-07-07 NOTE — Progress Notes (Deleted)
Cardiology Office Note:    Date:  07/07/2021   ID:  Isabel Berger, DOB 09-29-1965, MRN [id number]  PCP:  Isabel Fee, DO   [facility] Providers Cardiologist:  None { Click to update primary MD,subspecialty MD or APP then REFRESH:1}    Referring MD: Lind Covert, *   No chief complaint on file. ***  History of Present Illness:    Isabel Berger is a 55 y.o. female with a hx of htn, T2DM, BPPV, trigeminal neuralgia, referral for atypical chest pain in July 2022, HTN   CVD Risk/Equivalent: HLD- *** HTN- yes PAD- No DMII yes Smoker-no Stroke-No Premature Family History- ***   Past Medical History:  Diagnosis Date   Allergy    pollen   Constipation    Depression    Diabetes mellitus    Hypertension     Past Surgical History:  Procedure Laterality Date   ABDOMINAL HYSTERECTOMY     FOOT SURGERY Bilateral 2008 x 3   Dr Meridee Score    TOTAL ABDOMINAL HYSTERECTOMY W/ BILATERAL SALPINGOOPHORECTOMY  2004   fibroids    Current Medications: No outpatient medications have been marked as taking for the 07/08/21 encounter (Appointment) with Janina Mayo, MD.     Allergies:   Patient has no known allergies.   Social History   Socioeconomic History   Marital status: Divorced    Spouse name: Not on file   Number of children: Not on file   Years of education: Not on file   Highest education level: Not on file  Occupational History   Not on file  Tobacco Use   Smoking status: Never   Smokeless tobacco: Never  Vaping Use   Vaping Use: Never used  Substance and Sexual Activity   Alcohol use: Not Currently   Drug use: No   Sexual activity: Not Currently    Birth control/protection: Surgical  Other Topics Concern   Not on file  Social History Narrative   Not on file   Social Determinants of Health   Financial Resource Strain: Not on file  Food Insecurity: Not on file  Transportation Needs: Not on file  Physical Activity: Not  on file  Stress: Not on file  Social Connections: Not on file     Family History: The patient's ***family history includes Breast cancer (age of onset: 81) in her maternal aunt; Diabetes in her mother; Hypertension in her mother; Stroke in her mother. There is no history of Colon cancer, Colon polyps, Esophageal cancer, Rectal cancer, or Stomach cancer.  ROS:   Please see the history of present illness.    *** All other systems reviewed and are negative.  EKGs/Labs/Other Studies Reviewed:    The following studies were reviewed today: ***  EKG:  EKG is *** ordered today.  The ekg ordered today demonstrates ***  Recent Labs: 03/03/2021: BUN 13; Creatinine, Ser 0.60; Hemoglobin 13.9; Platelets 328; Potassium 3.8; Sodium 134  Recent Lipid Panel    Component Value Date/Time   CHOL 203 (H) 06/10/2018 1038   TRIG 114 06/10/2018 1038   HDL 42 06/10/2018 1038   CHOLHDL 4.8 (H) 06/10/2018 1038   CHOLHDL 4.5 06/21/2014 1114   VLDL 22 06/21/2014 1114   LDLCALC 138 (H) 06/10/2018 1038     Risk Assessment/Calculations:   {Does this patient have ATRIAL FIBRILLATION?[phone]}       Physical Exam:    VS:  There were no vitals taken for this visit.    Wt  Readings from Last 3 Encounters:  04/09/21 211 lb 9.6 oz (96 kg)  03/18/21 210 lb 3.2 oz (95.3 kg)  03/07/21 210 lb 12.8 oz (95.6 kg)     GEN: *** Well nourished, well developed in no acute distress HEENT: Normal NECK: No JVD; No carotid bruits LYMPHATICS: No lymphadenopathy CARDIAC: ***RRR, no murmurs, rubs, gallops RESPIRATORY:  Clear to auscultation without rales, wheezing or rhonchi  ABDOMEN: Soft, non-tender, non-distended MUSCULOSKELETAL:  No edema; No deformity  SKIN: Warm and dry NEUROLOGIC:  Alert and oriented x 3 PSYCHIATRIC:  Normal affect   ASSESSMENT:    No diagnosis found. PLAN:    In order of problems listed above:  ***      {Are you ordering a CV Procedure (e.g. stress test, cath, DCCV, TEE,  etc)?   Press F2        :[id number]}    Medication Adjustments/Labs and Tests Ordered: Current medicines are reviewed at length with the patient today.  Concerns regarding medicines are outlined above.  No orders of the defined types were placed in this encounter.  No orders of the defined types were placed in this encounter.   There are no Patient Instructions on file for this visit.   Signed, Janina Mayo, MD  07/07/2021 10:56 AM    [facility]

## 2021-07-08 ENCOUNTER — Other Ambulatory Visit: Payer: Self-pay

## 2021-07-08 ENCOUNTER — Ambulatory Visit: Payer: 59 | Admitting: Internal Medicine

## 2021-07-08 DIAGNOSIS — E119 Type 2 diabetes mellitus without complications: Secondary | ICD-10-CM

## 2021-07-11 ENCOUNTER — Other Ambulatory Visit: Payer: Self-pay | Admitting: Family Medicine

## 2021-07-11 DIAGNOSIS — E119 Type 2 diabetes mellitus without complications: Secondary | ICD-10-CM

## 2021-07-11 MED ORDER — TRESIBA FLEXTOUCH 200 UNIT/ML ~~LOC~~ SOPN: 65.0000 [IU] | PEN_INJECTOR | Freq: Every day | SUBCUTANEOUS | 0 refills | Status: DC

## 2021-07-11 MED ORDER — OZEMPIC (0.25 OR 0.5 MG/DOSE) 2 MG/1.5ML ~~LOC~~ SOPN: 0.2500 mg | PEN_INJECTOR | SUBCUTANEOUS | 1 refills | Status: DC

## 2021-07-11 NOTE — Progress Notes (Signed)
I have prescribed and refilled according to 8/16 note from Dr. Georgina Peer.   Isabel Agena Autry-Lott, DO 07/11/2021, 10:35 AM PGY-3, [place]

## 2021-08-03 ENCOUNTER — Encounter (HOSPITAL_COMMUNITY): Payer: Self-pay

## 2021-08-03 ENCOUNTER — Other Ambulatory Visit: Payer: Self-pay

## 2021-08-03 ENCOUNTER — Ambulatory Visit (HOSPITAL_COMMUNITY)
Admission: EM | Admit: 2021-08-03 | Discharge: 2021-08-03 | Disposition: A | Discharge status: Home / Self Care | Payer: 59 | Attending: Emergency Medicine | Admitting: Emergency Medicine

## 2021-08-03 DIAGNOSIS — G5602 Carpal tunnel syndrome, left upper limb: Secondary | ICD-10-CM | POA: Diagnosis not present

## 2021-08-03 DIAGNOSIS — M7712 Lateral epicondylitis, left elbow: Secondary | ICD-10-CM

## 2021-08-03 MED ORDER — METHYLPREDNISOLONE SODIUM SUCC 125 MG IJ SOLR
125.0000 mg | Freq: Once | INTRAMUSCULAR | Status: AC
  Administered 2021-08-03: 125 mg via INTRAMUSCULAR

## 2021-08-03 MED ORDER — METHYLPREDNISOLONE SODIUM SUCC 125 MG IJ SOLR
INTRAMUSCULAR | Status: AC
  Filled 2021-08-03: qty 2

## 2021-08-03 MED ORDER — METHYLPREDNISOLONE 4 MG PO TBPK: ORAL_TABLET | ORAL | 0 refills | Status: DC

## 2021-08-03 NOTE — Discharge Instructions (Addendum)
You were provided with an injection of methylprednisolone in the office today which should significantly kickoff the antiinflammatory process.  Please follow this up with a Medrol Dosepak, please take 1 row of tablets daily with your breakfast meal starting tomorrow morning.  Please be very careful about your intake of sugars and carbohydrates while taking steroids, as you are likely aware, steroids significantly elevate your blood sugar levels.  I recommend that you aim to eat less than 120 mg of carbs every day.  I recommend that you also apply the Voltaren gel that you have been prescribed topically 4 times daily.  Applying a bag of frozen peas or corn to the elbow 4 times daily for 20 minutes each time will also significantly reduce inflammation.  I provided you with some exercises that you can try to help strengthen your elbow tendons AFTER your pain and inflammation have improved.  Also provided you with some information regarding carpal tunnel syndrome which you probably have as well.  You can try wearing a wrist cock up splint, picture included, while you are trying to heal your elbow.  I recommend that you wear the splint is much as possible, avoid using your left hand is much as possible as well.
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
## 2021-08-03 NOTE — ED Triage Notes (Signed)
Pt presents for left arm pain x 2 weeks.

## 2021-08-06 ENCOUNTER — Other Ambulatory Visit: Payer: Self-pay

## 2021-08-06 ENCOUNTER — Ambulatory Visit (INDEPENDENT_AMBULATORY_CARE_PROVIDER_SITE_OTHER): Payer: 59 | Admitting: Family Medicine

## 2021-08-06 ENCOUNTER — Encounter: Payer: Self-pay | Admitting: Family Medicine

## 2021-08-06 VITALS — BP 168/101 | HR 87 | Wt 222.4 lb

## 2021-08-06 DIAGNOSIS — R252 Cramp and spasm: Secondary | ICD-10-CM

## 2021-08-06 DIAGNOSIS — I10 Essential (primary) hypertension: Secondary | ICD-10-CM

## 2021-08-06 DIAGNOSIS — M7712 Lateral epicondylitis, left elbow: Secondary | ICD-10-CM | POA: Insufficient documentation

## 2021-08-06 DIAGNOSIS — Z794 Long term (current) use of insulin: Secondary | ICD-10-CM | POA: Diagnosis not present

## 2021-08-06 DIAGNOSIS — M7710 Lateral epicondylitis, unspecified elbow: Secondary | ICD-10-CM | POA: Insufficient documentation

## 2021-08-06 DIAGNOSIS — E11620 Type 2 diabetes mellitus with diabetic dermatitis: Secondary | ICD-10-CM

## 2021-08-06 LAB — POCT GLYCOSYLATED HEMOGLOBIN (HGB A1C): HbA1c, POC (controlled diabetic range): 5.9 % (ref 0.0–7.0)

## 2021-08-06 MED ORDER — LOSARTAN POTASSIUM 50 MG PO TABS: 50.0000 mg | ORAL_TABLET | Freq: Every day | ORAL | 3 refills | Status: DC

## 2021-08-06 MED ORDER — DICLOFENAC SODIUM 1 % EX GEL: 4.0000 g | Freq: Four times a day (QID) | CUTANEOUS | 1 refills | Status: DC

## 2021-08-06 NOTE — Assessment & Plan Note (Signed)
Presentation most consistent with Tennis elbow.  No signs of fracture or other arthritis.  See after visit summary for analgesic regimen.  Recommend against splint and should do regular range of motion.

## 2021-08-06 NOTE — Progress Notes (Signed)
° °  [place]     SUBJECTIVE:   CHIEF COMPLAINT / HPI:   Elbow pain Seen and given solumedrol injection IM on 08/03/21 at [facility]  Has been taking ibuprofen and tylenol.  Has not gotten voltaren gel.  No injury or specific over use.  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  Supposed to follow up in October .  Taking Insulin regularly.  No low blood sugar but has not bee checking her blood sugar   Hypertension Not taking any blood pressure medications although thinks has in the past.  Agreeable to starting    OBJECTIVE:   BP (!) 168/101    Pulse 87    Wt 222 lb 6.4 oz (100.9 kg)    SpO2 100%    BMI 34.83 kg/m   Limited ROM of L shoulder elbow and wrist due to pain With encouragement has FROM except supination in L elbow Distal grip normal Tender focally over lateral epicondyle.  No redness  ASSESSMENT/PLAN:   Essential hypertension BP Readings from Last 3 Encounters:  08/06/21 (!) 168/101  08/03/21 (!) 160/106  03/13/21 140/79   Not at goal.  Given diabetes will start losartan., check labs today and will need follow up labs in a few weeks   T2DM (type 2 diabetes mellitus) ([place]) Lab Results  Component Value Date   HGBA1C 5.9 08/06/2021   Markedly improved.  Asked to follow blood sugar at home and to follow up with PCP  Lateral epicondylitis Presentation most consistent with Tennis elbow.  No signs of fracture or other arthritis.  See after visit summary for analgesic regimen.  Recommend against splint and should do regular range of motion.       Lind Covert, MD [place]

## 2021-08-06 NOTE — Assessment & Plan Note (Signed)
Lab Results  Component Value Date   HGBA1C 5.9 08/06/2021   Markedly improved.  Asked to follow blood sugar at home and to follow up with PCP

## 2021-08-06 NOTE — Patient Instructions (Signed)
Good to see you today - Thank you for coming in  Things we discussed today:  Diabetes - A1c is great.  Will check a blood test and let you know there results If you feel dizzy orf lightheadness check your blood sugar   Hypertension Start losartan 50 mg daily. Come see [place] in 1-2 weeks to check blood pressure and blood test  Your goal blood pressure is less than 140/90.  Check your blood pressure several times a week.  If regularly higher than this please let me know - either with MyChart or leaving a phone message. Next visit please bring in your blood pressure cuff.     Elbow - Take tylenol arthritis three times a day  - Use voltaren gel on the sore spot 4 x a day  - Take ibuprofen 2-3 three times a day as needed  - If taking ibuprofen take Pepcid every day - Use ice on the sore spot for 20 minutes several times a day   Please always bring your medication bottles  Make an apt with your Dr Janus Molder and Ernst Bowler at pharmacy

## 2021-08-06 NOTE — Assessment & Plan Note (Signed)
BP Readings from Last 3 Encounters:  08/06/21 (!) 168/101  08/03/21 (!) 160/106  03/13/21 140/79   Not at goal.  Given diabetes will start losartan., check labs today and will need follow up labs in a few weeks

## 2021-08-07 LAB — BASIC METABOLIC PANEL
BUN/Creatinine Ratio: 16 (ref 9–23)
BUN: 13 mg/dL (ref 6–24)
CO2: 24 mmol/L (ref 20–29)
Calcium: 9.2 mg/dL (ref 8.7–10.2)
Chloride: 103 mmol/L (ref 96–106)
Creatinine, Ser: 0.83 mg/dL (ref 0.57–1.00)
Glucose: 72 mg/dL (ref 70–99)
Potassium: 3.9 mmol/L (ref 3.5–5.2)
Sodium: 140 mmol/L (ref 134–144)
eGFR: 84 mL/min/{1.73_m2} (ref >59)

## 2021-08-15 ENCOUNTER — Telehealth: Payer: Self-pay

## 2021-08-15 NOTE — Telephone Encounter (Signed)
Patient calls nurse line reporting increased pain in her arm. Patient reports the pain is so bad she can barely put clothes on. Patient reports she has been nauseated due to the pain. Patient reports she has been taking tylenol arthritis TID with minimal relief. Patient reports the pharmacy did not have voltaren gel as a prescription for her. Patient advised this is an OTC medication she can purchase.   Patient is requesting something stronger for the Holidays. Patient advised to go to UC. Patient agreed with plan.

## 2021-08-16 NOTE — Patient Instructions (Addendum)
It was wonderful to see you today.  Please bring ALL of your medications with you to every visit.   Today we talked about:  -We are doing lab work today to check your kidney function and your electrolytes. I will send you a MyChart message if you have MyChart. Otherwise, I will give you a call for abnormal results or send a letter if everything returned back normal. If you don't hear from me in 2 weeks, please call the office.   -We are starting a new medication called Amlodipine. Take 5 mg once daily. Continue to check your blood pressures at home. Record your numbers, this gives [place] a better idea of what your true average readings are.  -I have refilled your gabapentin for your neuropathy. -I have sent a referral to Sports Medicine.   [address] [place],  [address] [phone]   Thank you for choosing [facility].   Please call [phone] with any questions about today's appointment.  Please be sure to schedule follow up at the front  desk before you leave today.   Sharion Settler, DO PGY-2 Family Medicine

## 2021-08-16 NOTE — Progress Notes (Signed)
° ° °  SUBJECTIVE:   CHIEF COMPLAINT / HPI:   Blood Pressure F/U Isabel Berger is a 55 y.o. female who presents to the clinic today for follow up on her blood pressure. Was seen on 12/14 and started on Losartan 50 mg daily given her elevated BP reading and history of T2DM. Reports good compliance. Home blood pressure readings range 341P-379K systolic and low 240X diastolic. Notes occasional chest "tightness" which she reports is chronic for her. She has been referred to cardiology for this. No shortness of breath of lower extremity edema.   Lateral Epicondylitis Continues to have pain. Has been following recommendations from prior visit but nothing seems to be helping. Cannot tolerate doing exercises as the pain is too severe. She works at [employer] and this is affecting her work.  PERTINENT  PMH / PSH:  Past Medical History:  Diagnosis Date   Allergy    pollen   Constipation    Depression    Diabetes mellitus    Hypertension     OBJECTIVE:   BP (!) 142/79    Pulse 76    Wt 217 lb 12.8 oz (98.8 kg)    SpO2 99%    BMI 34.11 kg/m    General: NAD, pleasant, able to participate in exam Cardiac: RRR, no murmurs. Respiratory: CTAB, normal effort, No wheezes, rales or rhonchi Extremities: no edema Skin: warm and dry, no rashes noted Psych: Normal affect and mood  ASSESSMENT/PLAN:   Essential hypertension Home BP readings are still quite high. She is lower in the office today but still above goal.  -Start Amlodipine 5 mg  -Continue Losartan 50 mg  -BMP today to assess electrolytes and renal function -F/u with me in 2 weeks for BP f/u; patient advised to bring log of home BP numbers   Lateral epicondylitis Reports that she is not improving with conservative measures of ice, heat, stretching.  States that she cannot afford physical therapy. Was unable to get Voltaren gel from pharmacy. Her primary source of income is through [employer] and she is constantly having to use her arms  to shop. -Referral to Sports Medicine  Diabetic polyneuropathy associated with type 2 diabetes mellitus ([place]) Notes that she was previously on gabapentin and it was helping greatly with her neuropathy.  States that this somehow fell off of her medication list and she had been unable to get a refill. -Restarted gabapentin today; can take 100 mg twice daily and 200-300 mg nightly per prior instructions.  Healthcare maintenance Influenza and PNA vaccinations provided today without complication.       Sharion Settler, [place]

## 2021-08-20 ENCOUNTER — Other Ambulatory Visit: Payer: Self-pay

## 2021-08-20 ENCOUNTER — Ambulatory Visit (INDEPENDENT_AMBULATORY_CARE_PROVIDER_SITE_OTHER): Payer: 59 | Admitting: Family Medicine

## 2021-08-20 ENCOUNTER — Encounter: Payer: Self-pay | Admitting: Family Medicine

## 2021-08-20 VITALS — BP 142/79 | HR 76 | Wt 217.8 lb

## 2021-08-20 DIAGNOSIS — E1142 Type 2 diabetes mellitus with diabetic polyneuropathy: Secondary | ICD-10-CM

## 2021-08-20 DIAGNOSIS — Z0000 Encounter for general adult medical examination without abnormal findings: Secondary | ICD-10-CM | POA: Insufficient documentation

## 2021-08-20 DIAGNOSIS — I10 Essential (primary) hypertension: Secondary | ICD-10-CM

## 2021-08-20 DIAGNOSIS — M7712 Lateral epicondylitis, left elbow: Secondary | ICD-10-CM | POA: Diagnosis not present

## 2021-08-20 DIAGNOSIS — Z23 Encounter for immunization: Secondary | ICD-10-CM

## 2021-08-20 MED ORDER — GABAPENTIN 100 MG PO CAPS: 100.0000 mg | ORAL_CAPSULE | Freq: Three times a day (TID) | ORAL | 3 refills | Status: DC

## 2021-08-20 MED ORDER — AMLODIPINE BESYLATE 5 MG PO TABS: 5.0000 mg | ORAL_TABLET | Freq: Every day | ORAL | 3 refills | Status: DC

## 2021-08-20 NOTE — Assessment & Plan Note (Signed)
Home BP readings are still quite high. She is lower in the office today but still above goal.  -Start Amlodipine 5 mg  -Continue Losartan 50 mg  -BMP today to assess electrolytes and renal function -F/u with me in 2 weeks for BP f/u; patient advised to bring log of home BP numbers

## 2021-08-20 NOTE — Addendum Note (Signed)
Addended by: Ozella Almond on: 08/20/2021 09:58 AM   Modules accepted: Orders

## 2021-08-20 NOTE — Assessment & Plan Note (Signed)
Influenza and PNA vaccinations provided today without complication.

## 2021-08-20 NOTE — Assessment & Plan Note (Signed)
Notes that she was previously on gabapentin and it was helping greatly with her neuropathy.  States that this somehow fell off of her medication list and she had been unable to get a refill. -Restarted gabapentin today; can take 100 mg twice daily and 200-300 mg nightly per prior instructions.

## 2021-08-20 NOTE — Assessment & Plan Note (Addendum)
Reports that she is not improving with conservative measures of ice, heat, stretching.  States that she cannot afford physical therapy. Was unable to get Voltaren gel from pharmacy. Her primary source of income is through [employer] and she is constantly having to use her arms to shop. -Referral to Sports Medicine

## 2021-08-21 LAB — BASIC METABOLIC PANEL
BUN/Creatinine Ratio: 14 (ref 9–23)
BUN: 10 mg/dL (ref 6–24)
CO2: 23 mmol/L (ref 20–29)
Calcium: 9.6 mg/dL (ref 8.7–10.2)
Chloride: 104 mmol/L (ref 96–106)
Creatinine, Ser: 0.71 mg/dL (ref 0.57–1.00)
Glucose: 104 mg/dL — ABNORMAL HIGH (ref 70–99)
Potassium: 4 mmol/L (ref 3.5–5.2)
Sodium: 139 mmol/L (ref 134–144)
eGFR: 101 mL/min/{1.73_m2} (ref >59)

## 2021-08-27 ENCOUNTER — Ambulatory Visit: Payer: 59 | Admitting: Pharmacist

## 2021-08-27 NOTE — Progress Notes (Deleted)
Subjective:    Patient ID: Isabel Berger, female    DOB: 04-16-1966, 56 y.o.   MRN: [id number]  HPI Patient is a 56 y.o. female who presents for diabetes management. She is in good spirits and presents without assistance. Patient was referred on 03/03/21 and last seen by provider, Dr. Susa Simmonds, on 03/07/21. Last seen in pharmacy clinic on 03/18/21.  Patient reports continued nausea despite reducing dose of Ozempic to 0.5mg  over the past two weeks. She also states that recently metformin has been "tearing her stomach up" despite previously tolerating well.  Insurance coverage/medication affordability: [employer]  Family/Social history: Works for [employer]  Current diabetes medications include: insulin degludec Tyler Aas) 68 units once daily in morning semaglutide (Ozempic) 0.5mg  on Sunday, metformin 1000mg  BID Patient states that She is taking her medications as prescribed. Patient reports adherence with medications.   Do you feel that your medications are working for you?  yes  Have you been experiencing any side effects to the medications prescribed? yes  Do you have any problems obtaining medications due to transportation or finances?  no   Patient reports hypoglycemic events based on glucometer but states she did not feel any different and was unaware Patient denies polyuria (increased urination).  Patient denies polyphagia (increased appetite).  Patient denies polydipsia (increased thirst).  Patient denies neuropathy (nerve pain). Patient denies visual changes. Patient reports self foot exams.   Fasting: 65 - 180; typically <100  Objective:   Labs:   Physical Exam Neurological:     Mental Status: She is alert and oriented to person, place, and time.    Review of Systems  Respiratory:  Positive for shortness of breath.   Cardiovascular:  Negative for chest pain and leg swelling.       Referral for cardiologist was put in and patient has appt this month   Gastrointestinal:  Positive for nausea. Negative for abdominal pain, diarrhea and vomiting.   Lab Results  Component Value Date   HGBA1C 5.9 08/06/2021   HGBA1C 11.3 (A) 02/28/2021   HGBA1C 5.4 11/04/2020    Lipid Panel     Component Value Date/Time   CHOL 203 (H) 06/10/2018 1038   TRIG 114 06/10/2018 1038   HDL 42 06/10/2018 1038   CHOLHDL 4.8 (H) 06/10/2018 1038   CHOLHDL 4.5 06/21/2014 1114   VLDL 22 06/21/2014 1114   LDLCALC 138 (H) 06/10/2018 1038   Assessment/Plan:   T2DM control is improving based on home blood glucose readings. Medication adherence appears optimal. Will back down to Ozempic 0.25mg  once weekly due to nausea and allow patient time to tolerate medication again before titrating back up. Will switch metformin to XR 500mg  BID to see if diarrhea improves. Will also decrease her Tresiba from 68 units to 65 units once daily as patient had several morning hypoglycemic events. At next visit will assess side effects and consider initiating SGLT2 at that time. Following instruction patient verbalized understanding of treatment plan.    Decreased basal insulin degludec Tyler Aas) to 65 units once daily.  Decreased GLP-1 semaglutide (Ozempic) to 0.25mg  once weekly on Sundays. Decreased metformin from 1000mg  BID to 500mg  BID and changed dosage form to extended release Extensively discussed pathophysiology of diabetes, dietary effects on blood sugar control, and recommended lifestyle interventions. Counseled on s/sx of and management of hypoglycemia Next A1C anticipated October 2022.   Follow-up appointment in four weeks to review sugar readings. Written patient instructions provided.  This appointment required 30 minutes of direct  patient care.  Thank you for involving pharmacy to assist in providing this patient's care.

## 2021-08-29 ENCOUNTER — Ambulatory Visit (INDEPENDENT_AMBULATORY_CARE_PROVIDER_SITE_OTHER): Payer: 59 | Admitting: Family Medicine

## 2021-08-29 VITALS — BP 138/84 | Ht 67.0 in | Wt 215.0 lb

## 2021-08-29 DIAGNOSIS — M7712 Lateral epicondylitis, left elbow: Secondary | ICD-10-CM | POA: Diagnosis not present

## 2021-08-29 MED ORDER — NITROGLYCERIN 0.2 MG/HR TD PT24: MEDICATED_PATCH | TRANSDERMAL | 1 refills | Status: DC

## 2021-08-29 MED ORDER — MELOXICAM 15 MG PO TABS: 15.0000 mg | ORAL_TABLET | Freq: Every day | ORAL | 2 refills | Status: DC

## 2021-08-29 NOTE — Progress Notes (Signed)
° °  Isabel Berger is a 56 y.o. female who presents to [facility] today for the following:  Left lateral epicondylitis Initially seen in urgent care on 12/11 for the same Given a wrist splint and course of prednisone as well as an IM Solu-Medrol Seen by primary care provider on 12/14 and 12/28 Was not having improvement in her pain, so was referred here She is left-hand dominant She works for [employer] and does a lot of lifting No injury States overall the pain has been occurring for at least a month and a half States that is now worsening and she is feeling pain in the posterior aspect of her elbow as well and having radiation down into her forearm States that she has been having difficulty sleeping because of the pain States that the prednisone that she was prescribed did not change her elbow pain at all States that it hurts to even pick up a can of soup Also hurts to turn a doorknob and has been doing so with her right hand  PMH reviewed.  ROS as above. Medications reviewed.  Exam:  BP 138/84    Ht 5\' 7"  (1.702 m)    Wt 215 lb (97.5 kg)    BMI 33.67 kg/m  Gen: Well NAD MSK:  Left Elbow: - Inspection: no obvious deformity b/l. No swelling, erythema or bruising b/l - Palpation: Diffuse tenderness to palpation of the elbow, worse at the lateral epicondyle and also rather tender at the triceps tendon insertion on the olecranon. - ROM: full active ROM in flexion and extension b/l, apprehensive secondary to pain - Strength: 5/5 strength in wrist flexion and extension with pain in both fields, worse with resisted extension.  5/5 strength in biceps, triceps b/l, pain with triceps testing. - Neuro: NV intact distally b/l - Special testing: Pain with resisted supination as well as resisted left third finger extension  Limited ultrasound of left elbow Left lateral epicondyle was visualized in transverse and longitudinal axis with common extensor tendon well visualized.  There is  heterogenous appearance of the tendon with small hypoechoic collections throughout and positive neovascularization on Doppler flow consistent with chronic tendinopathy and lateral epicondylitis.  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   Assessment and Plan: 1) Left lateral epicondylitis Rather severe lateral epicondylitis and likely compensatory triceps tendinopathy, supported by ultrasound.  No prior history of severe headaches within the last 15 years, will trial nitro patches, cock-up wrist splint, meloxicam for pain control, and given home exercises, advised to allow pain to be her guide.  We will have her follow-up in 4 weeks.  Did extensively counsel patient that this may take quite some time to completely resolve.   Arizona Constable, D.O.  PGY-4 [facility]  08/29/2021 10:31 AM  Addendum:  I was the preceptor for this visit and available for immediate consultation.  Karlton Lemon MD [place]

## 2021-08-29 NOTE — Assessment & Plan Note (Signed)
Rather severe lateral epicondylitis and likely compensatory triceps tendinopathy, supported by ultrasound.  No prior history of severe headaches within the last 15 years, will trial nitro patches, cock-up wrist splint, meloxicam for pain control, and given home exercises, advised to allow pain to be her guide.  We will have her follow-up in 4 weeks.  Did extensively counsel patient that this may take quite some time to completely resolve.

## 2021-08-29 NOTE — Patient Instructions (Signed)
Thank you for coming to see me today. It was a pleasure. Today we talked about:   Nitroglycerin Protocol  Apply 1/4 nitroglycerin patch to affected area daily. Change position of patch within the affected area every 24 hours. You may experience a headache during the first 1-2 weeks of using the patch, these should subside. If you experience headaches after beginning nitroglycerin patch treatment, you may take your preferred over the counter pain reliever. Another side effect of the nitroglycerin patch is skin irritation or rash related to patch adhesive. Please notify our office if you develop more severe headaches or rash, and stop the patch. Tendon healing with nitroglycerin patch may require 12 to 24 weeks depending on the extent of injury. Men should not use if taking Viagra, Cialis, or Levitra.  Do not use if you have migraines or rosacea.    Do home exercises, but let your pain be your guide.  I have also sent you a prescription for an anti-inflammatory that you can use for your pain.  While you are being active try to rest your wrist is much as possible to decrease the strain at your elbow by using a wrist brace.  Take off the brace to perform your exercises.  Please follow-up with [place] in 4 weeks.  If you have any questions or concerns, please do not hesitate to call the office at [phone].  Best,   Arizona Constable, DO [place]

## 2021-09-01 NOTE — Patient Instructions (Incomplete)
It was wonderful to see you today.  Please bring ALL of your medications with you to every visit.   Today we talked about: -We are doing lab work today to check your electrolytes and kidney function. I will send you a MyChart message if you have MyChart. Otherwise, I will give you a call for abnormal results or send a letter if everything returned back normal. If you don't hear from me in 2 weeks, please call the office.   -***    Thank you for choosing [facility].   Please call [phone] with any questions about today's appointment.  Please be sure to schedule follow up at the front  desk before you leave today.   Sharion Settler, DO PGY-2 Family Medicine

## 2021-09-01 NOTE — Progress Notes (Deleted)
° ° °  SUBJECTIVE:   CHIEF COMPLAINT / HPI:   Blood Pressure F/U Cordell Coke is a 56 y.o. female who presents to the clinic today for follow up on her blood pressure. Previously seen on 12/28 for blood pressure follow-up.  Blood pressure elevated 142/79 at that visit.  She was started on amlodipine 5 mg daily and advised to continue losartan 50 mg daily.  Reports good*** compliance. Home blood pressure readings range *** systolic and *** diastolic. Denies chest pain, shortness of breath, lower extremity edema, headaches, and vision changes.    PERTINENT  PMH / PSH:  Past Medical History:  Diagnosis Date   Allergy    pollen   Constipation    Depression    Diabetes mellitus    Hypertension      OBJECTIVE:   There were no vitals taken for this visit.   General: NAD, pleasant, able to participate in exam Cardiac: RRR, no murmurs. Respiratory: CTAB, normal effort, No wheezes, rales or rhonchi Abdomen: Bowel sounds present, nontender, nondistended, no hepatosplenomegaly. Extremities: no edema or cyanosis. Skin: warm and dry, no rashes noted Neuro: alert, no obvious focal deficits Psych: Normal affect and mood  ASSESSMENT/PLAN:   No problem-specific Assessment & Plan notes found for this encounter.      Sharion Settler, [place]

## 2021-09-02 ENCOUNTER — Ambulatory Visit: Payer: 59 | Admitting: Family Medicine

## 2021-09-03 ENCOUNTER — Telehealth: Payer: Self-pay | Admitting: *Deleted

## 2021-09-03 NOTE — Telephone Encounter (Signed)
Pt calls because she was started on a new BP med (amlodapine) at last visit and has a f/u appt in the am.  She wants to know if she should take her meds before her appt tomorrow because she did have an episode of lightheadedness today.  After the episode she checked her BP and it was 122/82. She is concerned because she was also given nitroglycerin from [facility] on Friday (1/6) and wonders if this could have cause her episode of lightheadedness.  Spoke with Dr. Thompson Grayer who recommends taking losartan tonight but holding nitro patch and amlodipine in the AM.  Pt will bring them with her to the visit.  Pt is agreeable to plan.  Precautions given to go to ED.  Christen Bame, CMA

## 2021-09-04 ENCOUNTER — Ambulatory Visit (INDEPENDENT_AMBULATORY_CARE_PROVIDER_SITE_OTHER): Payer: 59 | Admitting: Family Medicine

## 2021-09-04 ENCOUNTER — Encounter: Payer: Self-pay | Admitting: Family Medicine

## 2021-09-04 ENCOUNTER — Other Ambulatory Visit: Payer: Self-pay

## 2021-09-04 VITALS — BP 116/78 | HR 87 | Ht 67.0 in | Wt 221.0 lb

## 2021-09-04 DIAGNOSIS — I10 Essential (primary) hypertension: Secondary | ICD-10-CM

## 2021-09-04 NOTE — Progress Notes (Deleted)
° ° °  SUBJECTIVE:   CHIEF COMPLAINT / HPI:   BP concerns    PERTINENT  PMH / PSH: ***  OBJECTIVE:   BP 116/78    Pulse 87    Ht 5\' 7"  (1.702 m)    Wt 221 lb (100.2 kg)    SpO2 100%    BMI 34.61 kg/m   ***  ASSESSMENT/PLAN:   No problem-specific Assessment & Plan notes found for this encounter.     Gifford Shave, MD [place]

## 2021-09-04 NOTE — Progress Notes (Signed)
° ° °  SUBJECTIVE:   CHIEF COMPLAINT / HPI:   Blood pressure concerns Patient was seen in multiple office visits in December regarding her blood pressure.  She was having elevated blood pressures with compliance to her home medications which was losartan 50 mg daily.  Norvasc was added and patient was compliant with this.  Ports that since starting the amlodipine she has noticed episodes of lightheadedness.  She called the nurse line and was reporting a blood pressure of 122/82.  She does acknowledge that she was seen at sports medicine on Friday 1/6 and given a nitroglycerin patch for her elbow.  She reports that today she has not taken any of her blood pressure medications and is not using the Nitropatch.  Denies dizziness at this time.  Denies any other symptoms such as chest pain, shortness of breath, headache.  OBJECTIVE:   BP 116/78    Pulse 87    Ht 5\' 7"  (1.702 m)    Wt 221 lb (100.2 kg)    SpO2 100%    BMI 34.61 kg/m   General: Well-appearing 56 year old female in no acute distress Cardiac: Regular rate and rhythm, no murmurs appreciated Respiratory: Normal work of breathing, lungs clear to auscultation bilaterally Abdomen: Soft, nontender MSK: No gross abnormalities   ASSESSMENT/PLAN:   Essential hypertension Blood pressures within normal limits today on evaluation.  She has not taken any of her medications for the last 2 days.  Unclear cause for normotensive blood pressures without medications.  May be related to nitro patch.  Recommended patient monitor her blood pressure in the outpatient setting without taking her amlodipine.  Decrease losartan to 25 mg daily.  Repeat BMP today as well as collecting CBC and TSH to search for possible etiologies.  Follow-up in 2-4 weeks to assess need for restarting blood pressure medication.  Strict ED and return precautions given.     Gifford Shave, MD [place]

## 2021-09-04 NOTE — Patient Instructions (Signed)
It was good seeing you today.  I am not quite sure why her blood pressure is so well controlled today.  I want to cut sure losartan in half to 25 mg daily.  I do not want you to take your amlodipine.  I would like her to continue monitoring her blood pressures.  If they come back up please let us know and we can make further adjustments to your medications.  I would like to check some lab work today to look for causes of these blood pressure changes.  If you have any questions or concerns call the clinic.  Hope you have a great afternoon!

## 2021-09-05 LAB — BASIC METABOLIC PANEL
BUN/Creatinine Ratio: 15 (ref 9–23)
BUN: 11 mg/dL (ref 6–24)
CO2: 22 mmol/L (ref 20–29)
Calcium: 9.6 mg/dL (ref 8.7–10.2)
Chloride: 105 mmol/L (ref 96–106)
Creatinine, Ser: 0.73 mg/dL (ref 0.57–1.00)
Glucose: 78 mg/dL (ref 70–99)
Potassium: 4.3 mmol/L (ref 3.5–5.2)
Sodium: 141 mmol/L (ref 134–144)
eGFR: 98 mL/min/{1.73_m2} (ref >59)

## 2021-09-05 LAB — CBC
Hematocrit: 37.9 % (ref 34.0–46.6)
Hemoglobin: 12.7 g/dL (ref 11.1–15.9)
MCH: 29.7 pg (ref 26.6–33.0)
MCHC: 33.5 g/dL (ref 31.5–35.7)
MCV: 89 fL (ref 79–97)
Platelets: 422 10*3/uL (ref 150–450)
RBC: 4.28 x10E6/uL (ref 3.77–5.28)
RDW: 12.6 % (ref 11.7–15.4)
WBC: 7.9 10*3/uL (ref 3.4–10.8)

## 2021-09-05 LAB — TSH: TSH: 1.22 u[IU]/mL (ref 0.450–4.500)

## 2021-09-05 NOTE — Assessment & Plan Note (Signed)
Blood pressures within normal limits today on evaluation.  She has not taken any of her medications for the last 2 days.  Unclear cause for normotensive blood pressures without medications.  May be related to nitro patch.  Recommended patient monitor her blood pressure in the outpatient setting without taking her amlodipine.  Decrease losartan to 25 mg daily.  Repeat BMP today as well as collecting CBC and TSH to search for possible etiologies.  Follow-up in 2-4 weeks to assess need for restarting blood pressure medication.  Strict ED and return precautions given.

## 2021-09-22 ENCOUNTER — Other Ambulatory Visit: Payer: Self-pay | Admitting: Family Medicine

## 2021-09-22 DIAGNOSIS — E119 Type 2 diabetes mellitus without complications: Secondary | ICD-10-CM

## 2021-09-24 ENCOUNTER — Ambulatory Visit (INDEPENDENT_AMBULATORY_CARE_PROVIDER_SITE_OTHER): Payer: 59 | Admitting: Family Medicine

## 2021-09-24 VITALS — BP 167/90 | Ht 67.0 in | Wt 214.2 lb

## 2021-09-24 DIAGNOSIS — M7712 Lateral epicondylitis, left elbow: Secondary | ICD-10-CM

## 2021-09-24 MED ORDER — MELOXICAM 15 MG PO TABS
15.0000 mg | ORAL_TABLET | Freq: Every day | ORAL | 1 refills | Status: DC
Start: 2021-09-24 — End: 2021-10-22

## 2021-09-24 MED ORDER — PREDNISONE 10 MG PO TABS: ORAL_TABLET | ORAL | 0 refills | Status: AC

## 2021-09-24 NOTE — Patient Instructions (Signed)
Thank you for coming to see me today. It was a pleasure. Today we talked about:   I have sent you a prescription for prednisone.  You will take this over the next 6 days.  Do not take meloxicam while you are taking this, but the day after you finish it you can take this.  The prednisone can increase your blood sugar so keep a close eye on this.  You can stop using the nitro patches.  Do the exercises as you are able to tolerate.  You do not have to continue using the brace.  Please follow-up with [place] in 4 weeks or sooner as needed.  If you have any questions or concerns, please do not hesitate to call the office at [phone].  Best,   Arizona Constable, DO [place]

## 2021-09-24 NOTE — Assessment & Plan Note (Signed)
Given severity of her pain and need to take care of her mother, offered a corticosteroid injection under ultrasound guidance and she declined today.  We will send in prescription for 6-day prednisone burst.  After this, she can restart meloxicam.  Discontinue nitro patches and wrist brace.  Continue exercises as tolerated.  Follow-up in 4 weeks or sooner as needed if she decides she would like an injection before then.  Did discuss monitoring her blood sugars closely while she is taking prednisone.

## 2021-09-24 NOTE — Progress Notes (Signed)
° °  Isabel Berger is a 56 y.o. female who presents to [facility] today for the following:  Left lateral epicondylitis follow-up Last seen for the same on 1/6 Ultrasound at that time showed severe lateral epicondylitis and some compensatory triceps tendinopathy Given nitro patches and home exercises Has only been using nitro patches every other day because they are making her BP drop She has been working on her blood pressure with her PCP States that the wrist brace is making the pain worse States the meloxicam helps some, she has been using this about daily She is now caring for her mother Pain is getting more severe Radiating up and down her arm Denies any new injuries A1c currently well controlled, reports 5.9    PMH reviewed.  ROS as above. Medications reviewed.  Exam:  BP (!) 167/90    Ht 5\' 7"  (1.702 m)    Wt 214 lb 3.2 oz (97.2 kg)    BMI 33.55 kg/m  Gen: Well NAD MSK:  Left Elbow: - Inspection: no obvious deformity b/l. No swelling, erythema or bruising b/l - Palpation: No TTP b/l - ROM: full active ROM in flexion and extension b/l. No crepitus - Strength: 5/5 strength in wrist flexion and extension without pain b/l. 5/5 strength in biceps, triceps b/l. - Neuro: NV intact distally b/l - Special testing: no laxity with varus/valgus stress, negative milking maneuver. No pain with resisted ECRB or supination. Negative tinel's at radial tunnel   No results found.   Assessment and Plan: 1) Left lateral epicondylitis Given severity of her pain and need to take care of her mother, offered a corticosteroid injection under ultrasound guidance and she declined today.  We will send in prescription for 6-day prednisone burst.  After this, she can restart meloxicam.  Discontinue nitro patches and wrist brace.  Continue exercises as tolerated.  Follow-up in 4 weeks or sooner as needed if she decides she would like an injection before then.  Did discuss monitoring her blood sugars  closely while she is taking prednisone.   Arizona Constable, D.O.  PGY-4 [facility]  09/24/2021 2:32 PM  Addendum:  I was the preceptor for this visit and available for immediate consultation.  Karlton Lemon MD [place]

## 2021-10-10 ENCOUNTER — Ambulatory Visit: Payer: 59

## 2021-10-20 ENCOUNTER — Other Ambulatory Visit: Payer: Self-pay | Admitting: Family Medicine

## 2021-10-20 DIAGNOSIS — E119 Type 2 diabetes mellitus without complications: Secondary | ICD-10-CM

## 2021-10-22 ENCOUNTER — Ambulatory Visit (INDEPENDENT_AMBULATORY_CARE_PROVIDER_SITE_OTHER): Payer: 59 | Admitting: Family Medicine

## 2021-10-22 VITALS — BP 163/91 | Ht 67.0 in | Wt 215.0 lb

## 2021-10-22 DIAGNOSIS — M7712 Lateral epicondylitis, left elbow: Secondary | ICD-10-CM

## 2021-10-22 MED ORDER — DICLOFENAC SODIUM 75 MG PO TBEC: 75.0000 mg | DELAYED_RELEASE_TABLET | Freq: Two times a day (BID) | ORAL | 1 refills | Status: DC

## 2021-10-22 NOTE — Patient Instructions (Addendum)
You insurance requires you to go to a specific location to get the MRI done. See below for details: ? ?[facility] ?[place],  [place]  [address] ?[phone] ? ?They will contact you to schedule an appt after we have received prior authorization from your insurance company. ? ?Please stop taking the meloxicam and start the diclofenac we sent in. ? ?Follow up a few days after your MRI to go over results. ? ?Call with any questions in the meantime. ? ? ?

## 2021-10-22 NOTE — Assessment & Plan Note (Signed)
Rather puzzling that she did not have much improvement at all with the prednisone Dosepak.  Her findings on ultrasound are still consistent with chronic lateral epicondylitis, although it is rather severe.  We will change patient from meloxicam to diclofenac and advised avoiding other NSAIDs while taking.  In the meantime, I think she would benefit from an MRI to ensure that we are not missing any other pathology in the treatment.  If this also shows chronic lateral epicondylitis, could consider an injection in the future, although she has been rather apprehensive in regards to this.  Doubtful that she would tolerate shockwave therapy at this time.  She will follow-up after MRI for results. ?

## 2021-10-22 NOTE — Progress Notes (Signed)
? ?  Isabel Berger is a 56 y.o. female who presents to [facility] today for the following: ? ?Left lateral epicondylitis follow-up ?Patient last seen for this on 2/1 ?At that time had not been able to tolerate nitro patches or wrist brace ?Was given a prednisone Dosepak ?Did not notice any improvement at all with pred ?If anything, is continuing to worsen ?Now she is noting some numbness and tingling that extends into her fourth and fifth digit from the elbow ?States that she is having pain in the posterior aspect of the elbow as well as throughout the lateral epicondyle into the mid upper portion of her forearm ? ?PMH reviewed.  ?ROS as above. ?Medications reviewed. ? ?Exam:  ?BP (!) 163/91   Ht 5\' 7"  (1.702 m)   Wt 215 lb (97.5 kg)   BMI 33.67 kg/m?  ?Gen: Well NAD ?MSK: ? ?Left Elbow: ?- Inspection: no obvious deformity b/l. No swelling, erythema or bruising b/l ?- Palpation: Diffuse tenderness to light palpation, worse in the lateral epicondyle and throughout the common extensor tendon as well as in the triceps insertion at the olecranon ?- ROM: full active ROM in flexion and extension b/l. No crepitus ?- Strength: 5/5 strength in her hands bilaterally, she continues to have pain with resisted wrist extension on the left ?- Neuro: NV intact distally b/l ?- Special testing: Positive Tinel's at cubital tunnel ? ?Limited ultrasound of left elbow ?Shows lateral epicondyle visualized the transverse and longitudinal axis with common extensor and tendon well visualized.  There continues to be a heterogenous appearance of the tendon with small hypoechoic collections throughout and positive Doppler flow.  It does appear slightly worse than previous ultrasound.  Triceps tendon insertion also visualized at the olecranon in transverse and longitudinal axis without any significant abnormalities noted. 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 ?No results found. ? ? ?Assessment and Plan: ?1)  Left lateral epicondylitis ?Rather puzzling that she did not have much improvement at all with the prednisone Dosepak.  Her findings on ultrasound are still consistent with chronic lateral epicondylitis, although it is rather severe.  We will change patient from meloxicam to diclofenac and advised avoiding other NSAIDs while taking.  In the meantime, I think she would benefit from an MRI to ensure that we are not missing any other pathology in the treatment.  If this also shows chronic lateral epicondylitis, could consider an injection in the future, although she has been rather apprehensive in regards to this.  Doubtful that she would tolerate shockwave therapy at this time.  She will follow-up after MRI for results. ? ? ?Arizona Constable, D.O.  ?PGY-4 [facility]  ?10/22/2021 2:22 PM ? ?I was the preceptor for this visit and available for immediate consultation ?Shellia Cleverly, DO ?

## 2021-10-29 ENCOUNTER — Other Ambulatory Visit: Payer: Self-pay | Admitting: Family Medicine

## 2021-10-29 MED ORDER — DIAZEPAM 2 MG PO TABS: 2.0000 mg | ORAL_TABLET | Freq: Once | ORAL | 0 refills | Status: AC

## 2021-10-29 NOTE — Progress Notes (Signed)
Patient called requesting valium for MRI.  PMP reviewed, no red flags.  Verified that she will have a driver. ?

## 2021-10-29 NOTE — Addendum Note (Signed)
Addended by: Renaye Rakers on: 10/29/2021 08:43 AM ? ? Modules accepted: Orders ? ?

## 2021-11-01 ENCOUNTER — Other Ambulatory Visit: Payer: Self-pay

## 2021-11-01 ENCOUNTER — Ambulatory Visit (INDEPENDENT_AMBULATORY_CARE_PROVIDER_SITE_OTHER): Payer: 59

## 2021-11-01 DIAGNOSIS — M7712 Lateral epicondylitis, left elbow: Secondary | ICD-10-CM | POA: Diagnosis not present

## 2021-11-05 ENCOUNTER — Ambulatory Visit (INDEPENDENT_AMBULATORY_CARE_PROVIDER_SITE_OTHER): Payer: 59 | Admitting: Family Medicine

## 2021-11-05 VITALS — BP 161/90 | Ht 67.0 in | Wt 215.0 lb

## 2021-11-05 DIAGNOSIS — M7712 Lateral epicondylitis, left elbow: Secondary | ICD-10-CM

## 2021-11-05 NOTE — Progress Notes (Signed)
? ?  Isabel Berger is a 56 y.o. female who presents to [facility] today for the following: ? ?Left elbow pain follow-up ?Last seen for the same on 3/1 ?Had an MRI performed on 3/11 that showed full-thickness near complete tear of common extensor tendon with thickened radial collateral and ulnar collateral ligaments with likely partial tear ?Continues to have significant pain ? ? ?Assessment and Plan: ?1) Left lateral epicondylitis ?Discussed with patient the MRI findings.  Recommended that given the severity of her symptoms and findings on MRI would refer her to orthopedics for further evaluation and consideration for surgical management.  She is in agreement with this plan.  Referral placed to [facility] surgery at [facility]. ? ? ?Arizona Constable, D.O.  ?PGY-4 [facility]  ?11/05/2021 2:49 PM ? ?Addendum:  I was the preceptor for this visit and available for immediate consultation.  Karlton Lemon MD CAQSM ? ?

## 2021-11-05 NOTE — Assessment & Plan Note (Signed)
Discussed with patient the MRI findings.  Recommended that given the severity of her symptoms and findings on MRI would refer her to orthopedics for further evaluation and consideration for surgical management.  She is in agreement with this plan.  Referral placed to hand surgery at [facility]. ?

## 2021-11-07 ENCOUNTER — Other Ambulatory Visit: Payer: Self-pay

## 2021-11-07 ENCOUNTER — Ambulatory Visit (INDEPENDENT_AMBULATORY_CARE_PROVIDER_SITE_OTHER): Payer: 59 | Admitting: Orthopedic Surgery

## 2021-11-07 ENCOUNTER — Encounter: Payer: Self-pay | Admitting: Orthopedic Surgery

## 2021-11-07 DIAGNOSIS — M7712 Lateral epicondylitis, left elbow: Secondary | ICD-10-CM
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
## 2021-11-13 ENCOUNTER — Telehealth: Payer: Self-pay | Admitting: Orthopedic Surgery

## 2021-11-13 NOTE — Telephone Encounter (Signed)
Patient left a message stating she needs a note on letterhead to include her surgery date (11/18/21), type of procedure (Left lateral epicondyle debridement, Common extensor tendon repair), and approximate length of recovery. Patient is in touch with an agency that is going to help her during this process. Patient request you e-mail letter to her if possible, at [email or web address]. ?

## 2021-11-13 NOTE — Telephone Encounter (Signed)
I have created letter and patient has been made aware that this has been posted to her mychart or she is able to come into the office to pick this up. She states that she will print letter herself since this has been posted to her mychart. Patient had no further questions or concerns.  ?

## 2021-11-17 ENCOUNTER — Ambulatory Visit (INDEPENDENT_AMBULATORY_CARE_PROVIDER_SITE_OTHER): Payer: 59 | Admitting: Internal Medicine

## 2021-11-17 ENCOUNTER — Encounter: Payer: Self-pay | Admitting: Internal Medicine

## 2021-11-17 ENCOUNTER — Other Ambulatory Visit: Payer: Self-pay

## 2021-11-17 ENCOUNTER — Encounter (HOSPITAL_COMMUNITY): Payer: Self-pay | Admitting: Orthopedic Surgery

## 2021-11-17 VITALS — BP 134/88 | HR 92 | Ht 67.0 in | Wt 219.0 lb

## 2021-11-17 DIAGNOSIS — E785 Hyperlipidemia, unspecified: Secondary | ICD-10-CM

## 2021-11-17 DIAGNOSIS — Z794 Long term (current) use of insulin: Secondary | ICD-10-CM | POA: Diagnosis not present

## 2021-11-17 DIAGNOSIS — R739 Hyperglycemia, unspecified: Secondary | ICD-10-CM

## 2021-11-17 DIAGNOSIS — E119 Type 2 diabetes mellitus without complications: Secondary | ICD-10-CM

## 2021-11-17 DIAGNOSIS — E1165 Type 2 diabetes mellitus with hyperglycemia: Secondary | ICD-10-CM

## 2021-11-17 LAB — BASIC METABOLIC PANEL
BUN: 11 mg/dL (ref 6–23)
CO2: 24 mEq/L (ref 19–32)
Calcium: 9.6 mg/dL (ref 8.4–10.5)
Chloride: 100 mEq/L (ref 96–112)
Creatinine, Ser: 0.74 mg/dL (ref 0.40–1.20)
GFR: 91.6 mL/min (ref >60.00)
Glucose, Bld: 336 mg/dL — ABNORMAL HIGH (ref 70–99)
Potassium: 4.4 mEq/L (ref 3.5–5.1)
Sodium: 133 mEq/L — ABNORMAL LOW (ref 135–145)

## 2021-11-17 LAB — POCT GLYCOSYLATED HEMOGLOBIN (HGB A1C): Hemoglobin A1C: 8.4 % — AB (ref 4.0–5.6)

## 2021-11-17 LAB — MICROALBUMIN / CREATININE URINE RATIO
Creatinine,U: 63.8 mg/dL
Microalb Creat Ratio: 2.2 mg/g (ref 0.0–30.0)
Microalb, Ur: 1.4 mg/dL (ref 0.0–1.9)

## 2021-11-17 LAB — LIPID PANEL
Cholesterol: 171 mg/dL (ref 0–200)
HDL: 35.8 mg/dL — ABNORMAL LOW (ref >39.00)
NonHDL: 135.47
Total CHOL/HDL Ratio: 5
Triglycerides: 225 mg/dL — ABNORMAL HIGH (ref 0.0–149.0)
VLDL: 45 mg/dL — ABNORMAL HIGH (ref 0.0–40.0)

## 2021-11-17 LAB — LDL CHOLESTEROL, DIRECT: Direct LDL: 117 mg/dL

## 2021-11-17 MED ORDER — TRESIBA FLEXTOUCH 200 UNIT/ML ~~LOC~~ SOPN: 65.0000 [IU] | PEN_INJECTOR | Freq: Every day | SUBCUTANEOUS | 3 refills | Status: DC

## 2021-11-17 MED ORDER — FREESTYLE LIBRE 2 SENSOR MISC: 1.0000 | 3 refills | Status: DC

## 2021-11-17 MED ORDER — OZEMPIC (1 MG/DOSE) 4 MG/3ML ~~LOC~~ SOPN: 1.0000 mg | PEN_INJECTOR | SUBCUTANEOUS | 3 refills | Status: DC

## 2021-11-17 NOTE — Patient Instructions (Addendum)
-   Stop Metformin  ?-Increase Ozempic 1 mg weekly  ?- Continue Tresiba 65 units daily  ? ? ? ?HOW TO TREAT LOW BLOOD SUGARS (Blood sugar LESS THAN 70 MG/DL) ?Please follow the RULE OF 15 for the treatment of hypoglycemia treatment (when your (blood sugars are less than 70 mg/dL)  ? ?STEP 1: Take 15 grams of carbohydrates when your blood sugar is low, which includes:  ?3-4 GLUCOSE TABS  OR ?3-4 OZ OF JUICE OR REGULAR SODA OR ?ONE TUBE OF GLUCOSE GEL   ? ?STEP 2: RECHECK blood sugar in 15 MINUTES ?STEP 3: If your blood sugar is still low at the 15 minute recheck --> then, go back to STEP 1 and treat AGAIN with another 15 grams of carbohydrates. ? ?

## 2021-11-17 NOTE — Progress Notes (Signed)
?Name: Isabel Berger  ?MRN/ DOB: [id number], Mar 09, 1966   ?Age/ Sex: 56 y.o., female   ? ?PCP: Gerlene Fee, DO   ?Reason for Endocrinology Evaluation: Type 2 Diabetes Mellitus  ?   ?Date of Initial Endocrinology Visit: 11/17/2021   ? ? ?PATIENT IDENTIFIER: Isabel Berger is a 56 y.o. female with a past medical history of T2DM, dyslipidemia, trigeminal neuralgia. The patient presented for initial endocrinology clinic visit on 11/17/2021 for consultative assistance with her diabetes management.  ? ? ?HPI: ?Ms. Keahey was  ? ? ?Diagnosed with DM in 2015 ?Prior Medications tried/Intolerance: Metformin-GI side effects ?Currently checking blood sugars 0 x / day  ?Hypoglycemia episodes : Unknown              ?Hemoglobin A1c has ranged from 5.4% in 2022, peaking at > 15.0% in 2019. ?Patient required assistance for hypoglycemia:  ?Patient has required hospitalization within the last 1 year from hyper or hypoglycemia:  ? ?In terms of diet, the patient eats 1 meal a day, ? ? ?HOME DIABETES REGIMEN: ?Metformin ?Ozempic ?Tyler Aas ? ? ? ? ?Statin: No ?ACE-I/ARB: Yes ?Prior Diabetic Education: Yes ? ? ?METER DOWNLOAD SUMMARY: Does not check ? ? ?DIABETIC COMPLICATIONS: ?Microvascular complications:  ? ?Denies: CKD, neuropathy, retinopathy ?Last eye exam: Completed  ? ?Macrovascular complications:  ? ?Denies: CAD, PVD, CVA ? ? ?PAST HISTORY: ?Past Medical History:  ?Past Medical History:  ?Diagnosis Date  ? Allergy   ? pollen  ? Asthma   ? as a child  ? Constipation   ? Depression   ? Diabetes mellitus   ? Family history of adverse reaction to anesthesia   ? Mother- N/V  ? Headache   ? Hypertension   ? ?Past Surgical History:  ?Past Surgical History:  ?Procedure Laterality Date  ? ABDOMINAL HYSTERECTOMY    ? FOOT SURGERY Bilateral 2008 x 3  ? Dr Meridee Score   ? TOTAL ABDOMINAL HYSTERECTOMY W/ BILATERAL SALPINGOOPHORECTOMY  2004  ? fibroids  ?  ?Social History:  reports that she has never smoked. She has never used  smokeless tobacco. She reports that she does not currently use alcohol. She reports that she does not use drugs. ?Family History:  ?Family History  ?Problem Relation Age of Onset  ? Diabetes Mother   ? Hypertension Mother   ? Stroke Mother   ? Breast cancer Maternal Aunt 55  ? Colon cancer Neg Hx   ? Colon polyps Neg Hx   ? Esophageal cancer Neg Hx   ? Rectal cancer Neg Hx   ? Stomach cancer Neg Hx   ? ? ? ?HOME MEDICATIONS: ?Allergies as of 11/17/2021   ?No Known Allergies ?  ? ?  ?Medication List  ?  ? ?  ? Accurate as of November 17, 2021  3:58 PM. If you have any questions, ask your nurse or doctor.  ?  ?  ? ?  ? ?STOP taking these medications   ? ?glucose monitoring kit monitoring kit ?Stopped by: Dorita Sciara, MD ?  ?metFORMIN 500 MG 24 hr tablet ?Commonly known as: GLUCOPHAGE-XR ?Stopped by: Dorita Sciara, MD ?  ?Ozempic (0.25 or 0.5 MG/DOSE) 2 MG/1.5ML Sopn ?Generic drug: Semaglutide(0.25 or 0.5MG/DOS) ?Replaced by: Ozempic (1 MG/DOSE) 4 MG/3ML Sopn ?Stopped by: Dorita Sciara, MD ?  ? ?  ? ?TAKE these medications   ? ?acetaminophen 500 MG tablet ?Commonly known as: TYLENOL ?Take 1,000 mg by mouth every 6 (six) hours as needed  for moderate pain. ?  ?amLODipine 5 MG tablet ?Commonly known as: NORVASC ?Take 1 tablet (5 mg total) by mouth at bedtime. ?  ?diclofenac 75 MG EC tablet ?Commonly known as: VOLTAREN ?Take 1 tablet (75 mg total) by mouth 2 (two) times daily. ?  ?diclofenac Sodium 1 % Gel ?Commonly known as: Voltaren ?Apply 4 g topically 4 (four) times daily. ?  ?famotidine 20 MG tablet ?Commonly known as: PEPCID ?Take 1 tablet (20 mg total) by mouth 2 (two) times daily. ?  ?freestyle lancets ?Use to check glucose three times daily ?  ?FreeStyle Libre 2 Sensor Misc ?1 Device by Does not apply route every 14 (fourteen) days. ?Started by: Dorita Sciara, MD ?  ?FREESTYLE TEST STRIPS test strip ?Generic drug: glucose blood ?Use to check glucose once daily ?  ?gabapentin 100 MG  capsule ?Commonly known as: NEURONTIN ?Take 1 capsule (100 mg total) by mouth 3 (three) times daily. Take 100 mg twice a day and take 200-300 mg at bedtime. ?  ?LidoPure Patch 5 % Kit ?Apply 1 patch topically every 12 (twelve) hours as needed. ?  ?losartan 50 MG tablet ?Commonly known as: COZAAR ?Take 1 tablet (50 mg total) by mouth at bedtime. ?  ?nitroGLYCERIN 0.2 mg/hr patch ?Commonly known as: NITRODUR - Dosed in mg/24 hr ?Use 1/4 patch daily to the affected area. ?  ?nystatin powder ?Commonly known as: MYCOSTATIN/NYSTOP ?Apply topically 4 (four) times daily. ?What changed:  ?how much to take ?when to take this ?reasons to take this ?  ?Ozempic (1 MG/DOSE) 4 MG/3ML Sopn ?Generic drug: Semaglutide (1 MG/DOSE) ?Inject 1 mg into the skin once a week. ?Replaces: Ozempic (0.25 or 0.5 MG/DOSE) 2 MG/1.5ML Sopn ?Started by: Dorita Sciara, MD ?  ?Pen Needles 32G X 4 MM Misc ?1 pen by Does not apply route daily. ?  ?Tyler Aas FlexTouch 200 UNIT/ML FlexTouch Pen ?Generic drug: insulin degludec ?Inject 66 Units into the skin daily. ?What changed:  ?how much to take ?when to take this ?  ? ?  ? ? ? ?ALLERGIES: ?No Known Allergies ? ? ?REVIEW OF SYSTEMS: ?A comprehensive ROS was conducted with the patient and is negative except as per HPI and below:  ?Review of Systems  ?Gastrointestinal:  Negative for nausea and vomiting.  ? ?  ?OBJECTIVE:  ? ?VITAL SIGNS: BP 134/88   Pulse 92   Ht '5\' 7"'  (1.702 m)   Wt 219 lb (99.3 kg)   SpO2 98%   BMI 34.30 kg/m?   ? ?PHYSICAL EXAM:  ?General: Pt appears well and is in NAD  ?Neck: General: Supple without adenopathy or carotid bruits. ?Thyroid: Thyroid size normal.  No goiter or nodules appreciated.  ?Lungs: Clear with good BS bilat with no rales, rhonchi, or wheezes  ?Heart: RRR with normal S1 and S2 and no gallops; no murmurs; no rub  ?Abdomen: Normoactive bowel sounds, soft, nontender, without masses or organomegaly palpable  ?Extremities:  ?Lower extremities - No pretibial  edema. No lesions.  ?Neuro: MS is good with appropriate affect, pt is alert and Ox3  ? ? ? ?DATA REVIEWED: ? ?Lab Results  ?Component Value Date  ? HGBA1C 8.4 (A) 11/17/2021  ? HGBA1C 5.9 08/06/2021  ? HGBA1C 11.3 (A) 02/28/2021  ? ? Latest Reference Range & Units 11/17/21 12:12  ?Sodium 135 - 145 mEq/L 133 (L)  ?Potassium 3.5 - 5.1 mEq/L 4.4  ?Chloride 96 - 112 mEq/L 100  ?CO2 19 - 32 mEq/L 24  ?Glucose  70 - 99 mg/dL 336 (H)  ?BUN 6 - 23 mg/dL 11  ?Creatinine 0.40 - 1.20 mg/dL 0.74  ?Calcium 8.4 - 10.5 mg/dL 9.6  ?GFR >60.00 mL/min 91.60  ? ? Latest Reference Range & Units 11/17/21 12:12  ?Total CHOL/HDL Ratio  5  ?Cholesterol 0 - 200 mg/dL 171  ?HDL Cholesterol >39.00 mg/dL 35.80 (L)  ?Direct LDL mg/dL 117.0  ?MICROALB/CREAT RATIO 0.0 - 30.0 mg/g 2.2  ?NonHDL  135.47  ?Triglycerides 0.0 - 149.0 mg/dL 225.0 (H)  ?VLDL 0.0 - 40.0 mg/dL 45.0 (H)  ? ? Latest Reference Range & Units 11/17/21 12:12  ?Creatinine,U mg/dL 63.8  ?Microalb, Ur 0.0 - 1.9 mg/dL 1.4  ?MICROALB/CREAT RATIO 0.0 - 30.0 mg/g 2.2  ? ? ? ? ? Latest Reference Range & Units 09/04/21 11:41  ?Sodium 134 - 144 mmol/L 141  ?Potassium 3.5 - 5.2 mmol/L 4.3  ?Chloride 96 - 106 mmol/L 105  ?CO2 20 - 29 mmol/L 22  ?Glucose 70 - 99 mg/dL 78  ?BUN 6 - 24 mg/dL 11  ?Creatinine 0.57 - 1.00 mg/dL 0.73  ?Calcium 8.7 - 10.2 mg/dL 9.6  ?BUN/Creatinine Ratio 9 - 23  15  ?eGFR >59 mL/min/1.73 98  ? ? ?ASSESSMENT / PLAN / RECOMMENDATIONS:  ? ?1) Type 2 Diabetes Mellitus, poorly controlled, With out complications - Most recent A1c of 8.4%. Goal A1c <7.0%.   ? ?Plan: ?GENERAL: ?I have discussed with the patient the pathophysiology of diabetes.  We stressed the importance of lifestyle changes including diet and exercise. I explained the complications associated with diabetes including retinopathy, nephropathy, neuropathy as well as increased risk of cardiovascular disease. We went over the benefit seen with glycemic control.  ? ?I explained to the patient that diabetic  patients are at higher than normal risk for amputations. ?Patient is on patient assistance through the health department for Ozempic and insulin ?We emphasized the importance of eating proper meals of more avoiding sugar

## 2021-11-17 NOTE — Progress Notes (Signed)
Isabel Berger denies chest pain or shortness of breath. Patient denies having any s/s of Covid in her household.  Patient denies any known exposure to Covid.  ?Isabel Berger's PCP is Dr. Gifford Shave; ?Endocrinologist is Dr. Garald Braver. Patient was seen today at Dr. Quin Hoop office, A1C was 8.4, Ozempic was increased to 1.  Isabel Berger did not have a working CBG machine, patient was instructed to purchase one today. I  instructed Isabel Berger to take 1/2 dose of Tresiba in am if CBG is greater than 70. ?I instructed' \\Isabel'$  Berger to check CBG after awaking and every 2 hours until arrival  to the hospital.  ?I Instructed patient if CBG is less than 70 to take 4 Glucose Tablets or 1 tube of Glucose Gel or 1/2 cup of a clear juice. Recheck CBG in 15 minutes if CBG is not over 70 call, pre- op desk at [phone] for further instructions. If scheduled to receive Insulin, do not take Insulin. ? ?I instructed Isabel. Berger to shower with antibacteria soap.   No nail polish, artificial or acrylic nails. Wear clean clothes, brush your teeth. ?Glasses, contact lens,dentures or partials may not be worn in the OR. If you need to wear them, please bring a case for glasses, do not wear contacts or bring a case, the hospital does not have contact cases, dentures or partials will have to be removed , make sure they are clean, we will provide a denture cup to put them in. You will need some one to drive you home and a responsible person over the age of 73 to stay with you for the first 24 hours after surgery.  ? ?

## 2021-11-18 ENCOUNTER — Other Ambulatory Visit: Payer: Self-pay

## 2021-11-18 ENCOUNTER — Ambulatory Visit (HOSPITAL_BASED_OUTPATIENT_CLINIC_OR_DEPARTMENT_OTHER): Payer: 59 | Admitting: Anesthesiology

## 2021-11-18 ENCOUNTER — Encounter (HOSPITAL_COMMUNITY): Payer: Self-pay | Admitting: Orthopedic Surgery

## 2021-11-18 ENCOUNTER — Encounter: Payer: Self-pay | Admitting: Internal Medicine

## 2021-11-18 ENCOUNTER — Ambulatory Visit (HOSPITAL_COMMUNITY)
Admission: RE | Admit: 2021-11-18 | Discharge: 2021-11-18 | Disposition: A | Discharge status: Home / Self Care | Payer: 59 | Attending: Orthopedic Surgery | Admitting: Orthopedic Surgery

## 2021-11-18 ENCOUNTER — Encounter (HOSPITAL_COMMUNITY)
Admission: RE | Disposition: A | Discharge status: Home / Self Care | Payer: Self-pay | Source: Home / Self Care | Attending: Orthopedic Surgery

## 2021-11-18 ENCOUNTER — Ambulatory Visit (HOSPITAL_COMMUNITY): Payer: 59 | Admitting: Anesthesiology

## 2021-11-18 ENCOUNTER — Ambulatory Visit (HOSPITAL_COMMUNITY): Payer: 59

## 2021-11-18 DIAGNOSIS — E119 Type 2 diabetes mellitus without complications: Secondary | ICD-10-CM | POA: Diagnosis not present

## 2021-11-18 DIAGNOSIS — X58XXXA Exposure to other specified factors, initial encounter: Secondary | ICD-10-CM | POA: Insufficient documentation

## 2021-11-18 DIAGNOSIS — I10 Essential (primary) hypertension: Secondary | ICD-10-CM | POA: Insufficient documentation

## 2021-11-18 DIAGNOSIS — M7712 Lateral epicondylitis, left elbow: Secondary | ICD-10-CM | POA: Diagnosis not present

## 2021-11-18 DIAGNOSIS — S56512A Strain of other extensor muscle, fascia and tendon at forearm level, left arm, initial encounter: Secondary | ICD-10-CM | POA: Insufficient documentation

## 2021-11-18 HISTORY — PX: TENNIS ELBOW RELEASE/NIRSCHEL PROCEDURE: SHX6651

## 2021-11-18 HISTORY — DX: Unspecified asthma, uncomplicated: J45.909

## 2021-11-18 HISTORY — DX: Family history of other specified conditions: Z84.89

## 2021-11-18 HISTORY — DX: Headache, unspecified: R51.9

## 2021-11-18 LAB — CBC
HCT: 40.4 % (ref 36.0–46.0)
Hemoglobin: 13.4 g/dL (ref 12.0–15.0)
MCH: 29.8 pg (ref 26.0–34.0)
MCHC: 33.2 g/dL (ref 30.0–36.0)
MCV: 89.8 fL (ref 80.0–100.0)
Platelets: 387 10*3/uL (ref 150–400)
RBC: 4.5 MIL/uL (ref 3.87–5.11)
RDW: 11.9 % (ref 11.5–15.5)
WBC: 8.3 10*3/uL (ref 4.0–10.5)
nRBC: 0 % (ref 0.0–0.2)

## 2021-11-18 LAB — GLUCOSE, CAPILLARY
Glucose-Capillary: 284 mg/dL — ABNORMAL HIGH (ref 70–99)
Glucose-Capillary: 232 mg/dL — ABNORMAL HIGH (ref 70–99)

## 2021-11-18 SURGERY — TENNIS ELBOW RELEASE/NIRSCHEL PROCEDURE
Anesthesia: Monitor Anesthesia Care | Site: Elbow | Laterality: Left

## 2021-11-18 MED ORDER — OXYCODONE HCL 5 MG PO TABS: 5.0000 mg | ORAL_TABLET | Freq: Once | ORAL | Status: DC | PRN

## 2021-11-18 MED ORDER — DEXAMETHASONE SODIUM PHOSPHATE 10 MG/ML IJ SOLN
INTRAMUSCULAR | Status: DC | PRN
Start: 2021-11-18 — End: 2021-11-18
  Administered 2021-11-18: 5 mg via INTRAVENOUS

## 2021-11-18 MED ORDER — LACTATED RINGERS IV SOLN: INTRAVENOUS | Status: DC

## 2021-11-18 MED ORDER — PROPOFOL 500 MG/50ML IV EMUL
INTRAVENOUS | Status: DC | PRN
  Administered 2021-11-18: 50 ug/kg/min via INTRAVENOUS

## 2021-11-18 MED ORDER — LIDOCAINE 2% (20 MG/ML) 5 ML SYRINGE
INTRAMUSCULAR | Status: DC | PRN
  Administered 2021-11-18: 40 mg via INTRAVENOUS

## 2021-11-18 MED ORDER — MIDAZOLAM HCL 2 MG/2ML IJ SOLN: 2.0000 mg | Freq: Once | INTRAMUSCULAR | Status: AC

## 2021-11-18 MED ORDER — MIDAZOLAM HCL 2 MG/2ML IJ SOLN
INTRAMUSCULAR | Status: AC
  Administered 2021-11-18: 2 mg via INTRAVENOUS
  Filled 2021-11-18: qty 2

## 2021-11-18 MED ORDER — FENTANYL CITRATE (PF) 100 MCG/2ML IJ SOLN
INTRAMUSCULAR | Status: AC
  Administered 2021-11-18: 50 ug via INTRAVENOUS
  Filled 2021-11-18: qty 2

## 2021-11-18 MED ORDER — FENTANYL CITRATE (PF) 100 MCG/2ML IJ SOLN: 50.0000 ug | Freq: Once | INTRAMUSCULAR | Status: AC

## 2021-11-18 MED ORDER — CHLORHEXIDINE GLUCONATE 0.12 % MT SOLN: 15.0000 mL | Freq: Once | OROMUCOSAL | Status: AC

## 2021-11-18 MED ORDER — DEXAMETHASONE SODIUM PHOSPHATE 10 MG/ML IJ SOLN
INTRAMUSCULAR | Status: AC
  Filled 2021-11-18: qty 1

## 2021-11-18 MED ORDER — ACETAMINOPHEN 500 MG PO TABS: 1000.0000 mg | ORAL_TABLET | Freq: Once | ORAL | Status: AC

## 2021-11-18 MED ORDER — ONDANSETRON HCL 4 MG/2ML IJ SOLN
INTRAMUSCULAR | Status: AC
  Filled 2021-11-18: qty 2

## 2021-11-18 MED ORDER — OXYCODONE HCL 5 MG PO TABS: 5.0000 mg | ORAL_TABLET | Freq: Four times a day (QID) | ORAL | 0 refills | Status: AC | PRN

## 2021-11-18 MED ORDER — INSULIN ASPART 100 UNIT/ML IJ SOLN
0.0000 [IU] | INTRAMUSCULAR | Status: DC | PRN
  Administered 2021-11-18: 8 [IU] via SUBCUTANEOUS

## 2021-11-18 MED ORDER — ONDANSETRON HCL 4 MG/2ML IJ SOLN
INTRAMUSCULAR | Status: DC | PRN
  Administered 2021-11-18: 4 mg via INTRAVENOUS

## 2021-11-18 MED ORDER — ACETAMINOPHEN 500 MG PO TABS
ORAL_TABLET | ORAL | Status: AC
  Administered 2021-11-18: 1000 mg via ORAL
  Filled 2021-11-18: qty 2

## 2021-11-18 MED ORDER — AMISULPRIDE (ANTIEMETIC) 5 MG/2ML IV SOLN: 10.0000 mg | Freq: Once | INTRAVENOUS | Status: DC | PRN

## 2021-11-18 MED ORDER — PROPOFOL 1000 MG/100ML IV EMUL
INTRAVENOUS | Status: AC
  Filled 2021-11-18: qty 100

## 2021-11-18 MED ORDER — ORAL CARE MOUTH RINSE: 15.0000 mL | Freq: Once | OROMUCOSAL | Status: AC

## 2021-11-18 MED ORDER — LIDOCAINE 2% (20 MG/ML) 5 ML SYRINGE
INTRAMUSCULAR | Status: AC
  Filled 2021-11-18: qty 5

## 2021-11-18 MED ORDER — CEFAZOLIN SODIUM-DEXTROSE 2-4 GM/100ML-% IV SOLN
INTRAVENOUS | Status: AC
Start: 2021-11-18 — End: 2021-11-18
  Filled 2021-11-18: qty 100

## 2021-11-18 MED ORDER — PROPOFOL 10 MG/ML IV BOLUS
INTRAVENOUS | Status: AC
  Filled 2021-11-18: qty 20

## 2021-11-18 MED ORDER — OXYCODONE HCL 5 MG/5ML PO SOLN: 5.0000 mg | Freq: Once | ORAL | Status: DC | PRN

## 2021-11-18 MED ORDER — CEFAZOLIN SODIUM-DEXTROSE 2-4 GM/100ML-% IV SOLN
2.0000 g | INTRAVENOUS | Status: AC
  Administered 2021-11-18: 2 g via INTRAVENOUS

## 2021-11-18 MED ORDER — ATORVASTATIN CALCIUM 10 MG PO TABS: 10.0000 mg | ORAL_TABLET | Freq: Every day | ORAL | 3 refills | Status: DC

## 2021-11-18 MED ORDER — 0.9 % SODIUM CHLORIDE (POUR BTL) OPTIME
TOPICAL | Status: DC | PRN
  Administered 2021-11-18: 1000 mL

## 2021-11-18 MED ORDER — ROPIVACAINE HCL 5 MG/ML IJ SOLN
INTRAMUSCULAR | Status: DC | PRN
  Administered 2021-11-18: 30 mL via PERINEURAL

## 2021-11-18 MED ORDER — ONDANSETRON HCL 4 MG/2ML IJ SOLN: 4.0000 mg | Freq: Once | INTRAMUSCULAR | Status: DC | PRN

## 2021-11-18 MED ORDER — FENTANYL CITRATE (PF) 100 MCG/2ML IJ SOLN: 25.0000 ug | INTRAMUSCULAR | Status: DC | PRN

## 2021-11-18 MED ORDER — FENTANYL CITRATE (PF) 250 MCG/5ML IJ SOLN
INTRAMUSCULAR | Status: AC
  Filled 2021-11-18: qty 5

## 2021-11-18 MED ORDER — CHLORHEXIDINE GLUCONATE 0.12 % MT SOLN
OROMUCOSAL | Status: AC
  Administered 2021-11-18: 15 mL via OROMUCOSAL
  Filled 2021-11-18: qty 15

## 2021-11-18 MED ORDER — MIDAZOLAM HCL 2 MG/2ML IJ SOLN
INTRAMUSCULAR | Status: AC
  Filled 2021-11-18: qty 2

## 2021-11-18 SURGICAL SUPPLY — 59 items
ANCH SUT 1 SHRT SM RGD INSRTR (Anchor) ×2 IMPLANT
ANCH SUT 2.9 KNTLS QUATTRO (Anchor) ×1 IMPLANT
ANCHOR QUATTRO LINK KNTLS 2.9 (Anchor) ×1 IMPLANT
ANCHOR SUT 1.45 SZ 1 SHORT (Anchor) ×2 IMPLANT
BAG COUNTER SPONGE SURGICOUNT (BAG) ×3 IMPLANT
BAG SPNG CNTER NS LX DISP (BAG) ×1
BIT DRILL HARD BONE 2.9 ANCHOR (DRILL) IMPLANT
BNDG CMPR 9X4 STRL LF SNTH (GAUZE/BANDAGES/DRESSINGS) ×1
BNDG ELASTIC 3X5.8 VLCR STR LF (GAUZE/BANDAGES/DRESSINGS) IMPLANT
BNDG ELASTIC 4X5.8 VLCR STR LF (GAUZE/BANDAGES/DRESSINGS) ×2 IMPLANT
BNDG ESMARK 4X9 LF (GAUZE/BANDAGES/DRESSINGS) ×3 IMPLANT
BNDG GAUZE ELAST 4 BULKY (GAUZE/BANDAGES/DRESSINGS) ×1 IMPLANT
CORD BIPOLAR FORCEPS 12FT (ELECTRODE) ×3 IMPLANT
COVER SURGICAL LIGHT HANDLE (MISCELLANEOUS) ×3 IMPLANT
CUFF TOURN SGL QUICK 18X4 (TOURNIQUET CUFF) ×3 IMPLANT
CUFF TOURN SGL QUICK 24 (TOURNIQUET CUFF)
CUFF TRNQT CYL 24X4X16.5-23 (TOURNIQUET CUFF) IMPLANT
DECANTER SPIKE VIAL GLASS SM (MISCELLANEOUS) IMPLANT
DRILL HARD BONE 2.9 ANCHOR (DRILL) ×2
DURAPREP 26ML APPLICATOR (WOUND CARE) ×4 IMPLANT
GAUZE SPONGE 4X4 12PLY STRL (GAUZE/BANDAGES/DRESSINGS) IMPLANT
GAUZE XEROFORM 1X8 LF (GAUZE/BANDAGES/DRESSINGS) IMPLANT
GAUZE XEROFORM 5X9 LF (GAUZE/BANDAGES/DRESSINGS) ×1 IMPLANT
GLOVE SRG 8 PF TXTR STRL LF DI (GLOVE) ×2 IMPLANT
GLOVE SURG LTX SZ8 (GLOVE) ×3 IMPLANT
GLOVE SURG UNDER POLY LF SZ8 (GLOVE) ×2
GOWN STRL REUS W/ TWL LRG LVL3 (GOWN DISPOSABLE) ×6 IMPLANT
GOWN STRL REUS W/TWL LRG LVL3 (GOWN DISPOSABLE) ×6
KIT BASIN OR (CUSTOM PROCEDURE TRAY) ×3 IMPLANT
KIT TURNOVER KIT B (KITS) ×3 IMPLANT
MANIFOLD NEPTUNE II (INSTRUMENTS) ×3 IMPLANT
NDL PRECISIONGLIDE 27X1.5 (NEEDLE) IMPLANT
NEEDLE PRECISIONGLIDE 27X1.5 (NEEDLE) IMPLANT
NS IRRIG 1000ML POUR BTL (IV SOLUTION) ×3 IMPLANT
PACK ORTHO EXTREMITY (CUSTOM PROCEDURE TRAY) ×3 IMPLANT
PAD ARMBOARD 7.5X6 YLW CONV (MISCELLANEOUS) ×6 IMPLANT
PAD CAST 3X4 CTTN HI CHSV (CAST SUPPLIES) IMPLANT
PAD CAST 4YDX4 CTTN HI CHSV (CAST SUPPLIES) IMPLANT
PADDING CAST COTTON 3X4 STRL (CAST SUPPLIES)
PADDING CAST COTTON 4X4 STRL (CAST SUPPLIES) ×2
PADDING CAST COTTON 6X4 STRL (CAST SUPPLIES) ×1 IMPLANT
SLING ARM FOAM STRAP XLG (SOFTGOODS) ×1 IMPLANT
SPECIMEN JAR SMALL (MISCELLANEOUS) ×3 IMPLANT
STRIP CLOSURE SKIN 1/2X4 (GAUZE/BANDAGES/DRESSINGS) IMPLANT
SUCTION FRAZIER HANDLE 10FR (MISCELLANEOUS)
SUCTION TUBE FRAZIER 10FR DISP (MISCELLANEOUS) IMPLANT
SUT ETHILON 4 0 PS 2 18 (SUTURE) ×1 IMPLANT
SUT ETHILON 5 0 PS 2 18 (SUTURE) IMPLANT
SUT MNCRL AB 3-0 PS2 27 (SUTURE) ×1 IMPLANT
SUT PROLENE 3 0 PS 2 (SUTURE) IMPLANT
SUT VIC AB 0 CT1 27 (SUTURE) ×2
SUT VIC AB 0 CT1 27XBRD ANBCTR (SUTURE) IMPLANT
SUT VIC AB 3-0 FS2 27 (SUTURE) IMPLANT
SYR CONTROL 10ML LL (SYRINGE) IMPLANT
TOWEL GREEN STERILE (TOWEL DISPOSABLE) ×3 IMPLANT
TOWEL GREEN STERILE FF (TOWEL DISPOSABLE) ×3 IMPLANT
TUBE CONNECTING 12X1/4 (SUCTIONS) IMPLANT
UNDERPAD 30X36 HEAVY ABSORB (UNDERPADS AND DIAPERS) ×3 IMPLANT
WATER STERILE IRR 1000ML POUR (IV SOLUTION) ×3 IMPLANT

## 2021-11-18 NOTE — Anesthesia Procedure Notes (Signed)
Procedure Name: [place] ?Date/Time: 11/18/2021 11:20 AM ?Performed by: Michele Rockers, CRNA ?Pre-anesthesia Checklist: Patient identified, Emergency Drugs available, Suction available, Timeout performed and Patient being monitored ?Patient Re-evaluated:Patient Re-evaluated prior to induction ?Oxygen Delivery Method: Simple face mask ? ? ? ? ?

## 2021-11-18 NOTE — Transfer of Care (Signed)
Immediate Anesthesia Transfer of Care Note ? ?Patient: Liane Tribbey Rollins ? ?Procedure(s) Performed: LEFT LATERAL EPICONDYLE DEBRIDEMENT, COMMON EXTENSOR TENDON REPAIR (Left: Elbow) ? ?Patient Location: PACU ? ?Anesthesia Type:MAC combined with regional for post-op pain ? ?Level of Consciousness: drowsy, patient cooperative and responds to stimulation ? ?Airway & Oxygen Therapy: Patient Spontanous Breathing and Patient connected to nasal cannula oxygen ? ?Post-op Assessment: Report given to RN and Post -op Vital signs reviewed and stable ? ?Post vital signs: Reviewed and stable ? ?Last Vitals:  ?Vitals Value Taken Time  ?BP 144/85 11/18/21 1238  ?Temp    ?Pulse 85 11/18/21 1241  ?Resp 21 11/18/21 1241  ?SpO2 100 % 11/18/21 1241  ?Vitals shown include unvalidated device data. ? ?Last Pain:  ?Vitals:  ? 11/18/21 1039  ?TempSrc:   ?PainSc: 0-No pain  ?   ? ?  ? ?Complications: No notable events documented. ?

## 2021-11-18 NOTE — Op Note (Signed)
? ?Date of Surgery: 11/18/2021 ? ?INDICATIONS: Ms. 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  She has failed conservative treatment with oral anti-inflammatory medication, corticosteroid injection, bracing, and activity modification.  She was not interested in trying occupational therapy and wished to proceed with surgical intervention.  Risks, benefits, and alternatives to surgery were again discussed with the patient wishing to proceed with surgery.  Informed consent was signed after our discussion.  ? 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 ?POSTOPERATIVE DIAGNOSIS: Same. ? ?PROCEDURE:  ?Debridement of left lateral epicondyle ?Repair of left common extensor origin ? ? ?SURGEON: Audria Nine, M.D. ? ?ASSIST: Vanetta Mulders, M.D. ? ?ANESTHESIA:  Regional, MAC ? ?IV FLUIDS AND URINE: See anesthesia. ? ?ESTIMATED BLOOD LOSS: 15 mL. ? ?IMPLANTS:  ?Implant Name Type Inv. Item Serial No. Manufacturer Lot No. LRB No. Used Action  ?ANCHOR SUT 1.45 SZ 1 SHORT - [id number] Anchor ANCHOR SUT 1.45 SZ 1 SHORT  [employer](ORTH,TRAU,BIO,SG) [id number] Left 1 Implanted  ?ANCHOR SUT 1.45 SZ 1 SHORT - [id number] Anchor ANCHOR SUT 1.45 SZ 1 SHORT  [employer](ORTH,TRAU,BIO,SG) [id number] Left 1 Implanted  ?ANCHOR QUATTRO LINK KNTLS 2.9 - [id number] Anchor ANCHOR QUATTRO LINK KNTLS 2.9  [employer](ORTH,TRAU,BIO,SG) [id number] Left 1 Implanted  ?ANCHOR QUATTRO LINK KNTLS 2.9 - [id number] Anchor ANCHOR QUATTRO LINK KNTLS 2.9  [employer](ORTH,TRAU,BIO,SG) [id number] Left 1 Wasted  ?  ? ?DRAINS: None ? ?COMPLICATIONS: None ? ?DESCRIPTION OF PROCEDURE: The patient was met in the preoperative holding area where the surgical site was marked and the consent form was verified.  The patient was then taken to the operating room and transferred to the operating table.  All  bony prominences were well padded.  Monitored sedation was induced.  The operative extremity was prepped and draped in the usual and sterile fashion.  A formal time-out was performed to confirm that this was the correct patient, surgery, side, and site.  ? ?Following timeout, a longitudinal approach was made centered over the lateral epicondyle extending proximally along the supracondylar ridge.  Skin and subcutaneous tissues were divided.  Small crossing vessels were coagulated with the Bovie electrocautery.  The common extensor fascia was identified as was the lateral epicondyle.  The common extensor had avulsed from the lateral epicondyle and was slightly retracted.  There was pseudo tendon formation from the common extensor to the lateral epicondyle.  The pseudo tendon tissue was debrided.  A rongeur was used to debride the lateral epicondyle to allow for an adequate bony healing surface.  two 1.4 mm juggernaut anchors were placed at the lateral epicondyle.  The common extensor tendon was tied down in a horizontal mattress fashion.  The 4 tails from these suture anchors were then placed in a [employer] anchor.  The anchor site was drilled and the anchor impacted into place.  This created a double row type of repair to spread the compression of the extensor tendon over the entire lateral epicondyle.  The repair appeared quite solid with anatomic reapproximation of the common extensor tendon.  At this point the wound was thoroughly irrigated.  It was closed in a layered fashion using 3-0 Monocryl suture for the adipose layer and subcutaneous layer.  This was followed by 4-0 nylon suture in a horizontal mattress fashion for the skin.  The wound was dressed with Xeroform, folded Kerlix, cast padding, and a long-arm posterior  splint with the wrist extended.  Patient was then reversed from anesthesia and transferred to the postoperative bed.  All counts were correct x2 at the end of the procedure.  The patient was  then taken the PACU in stable condition. ? ?Dr. Sammuel Hines assited with the repair of the extensor and deployment of the double row repair configuration. ? ? ?POSTOPERATIVE PLAN: She will be discharged home with appropriate pain medication and discharge instructions.  I will see her back in the office in 10 to 14 days for her first postoperative visit. ? ?Audria Nine, MD ?12:37 PM  ? ?

## 2021-11-18 NOTE — Anesthesia Preprocedure Evaluation (Addendum)
Anesthesia Evaluation  ?Patient identified by MRN, date of birth, ID band ?Patient awake ? ? ? ?Reviewed: ?Allergy & Precautions, NPO status , Patient's Chart, lab work & pertinent test results ? ?History of Anesthesia Complications ?Negative for: history of anesthetic complications ? ?Airway ?Mallampati: II ? ?TM Distance: >3 FB ?Neck ROM: Full ? ? ? Dental ?  ?Pulmonary ?neg pulmonary ROS,  ?  ?Pulmonary exam normal ? ? ? ? ? ? ? Cardiovascular ?hypertension, negative cardio ROS ?Normal cardiovascular exam ? ? ?  ?Neuro/Psych ?negative neurological ROS ?   ? GI/Hepatic ?negative GI ROS, Neg liver ROS,   ?Endo/Other  ?negative endocrine ROSdiabetes ? Renal/GU ?negative Renal ROS  ?negative genitourinary ?  ?Musculoskeletal ?negative musculoskeletal ROS ?(+)  ? Abdominal ?  ?Peds ? Hematology ?negative hematology ROS ?(+)   ?Anesthesia Other Findings ? ? Reproductive/Obstetrics ? ?  ? ? ? ? ? ? ? ? ? ? ? ? ? ?  ?  ? ? ? ? ? ? ? ?Anesthesia Physical ?Anesthesia Plan ? ?ASA: 2 ? ?Anesthesia Plan: MAC and Regional  ? ?Post-op Pain Management: Regional block* and Tylenol PO (pre-op)*  ? ?Induction: Intravenous ? ?PONV Risk Score and Plan: 2 and Propofol infusion, TIVA and Treatment may vary due to age or medical condition ? ?Airway Management Planned: Natural Airway, Nasal Cannula and Simple Face Mask ? ?Additional Equipment: None ? ?Intra-op Plan:  ? ?Post-operative Plan:  ? ?Informed Consent: I have reviewed the patients History and Physical, chart, labs and discussed the procedure including the risks, benefits and alternatives for the proposed anesthesia with the patient or authorized representative who has indicated his/her understanding and acceptance.  ? ? ? ? ? ?Plan Discussed with:  ? ?Anesthesia Plan Comments:   ? ? ? ? ? ? ?Anesthesia Quick Evaluation ? ?

## 2021-11-18 NOTE — Brief Op Note (Signed)
11/18/2021 ? ?12:30 PM ? ?PATIENT:  Margy Sumler Creason  56 y.o. female ? ?PRE-OPERATIVE DIAGNOSIS:  LEFT LATERAL EPICONDYLITIS ? ?POST-OPERATIVE DIAGNOSIS:  LEFT LATERAL EPICONDYLITIS ? ?PROCEDURE:  Procedure(s): ?LEFT LATERAL EPICONDYLE DEBRIDEMENT, COMMON EXTENSOR TENDON REPAIR (Left) ? ?SURGEON:  Surgeon(s) and Role: ?   * Sherilyn Cooter, MD - Primary ?   Vanetta Mulders, MD - Assisting ? ?PHYSICIAN ASSISTANT:  ? ?ASSISTANTS: See above  ? ?ANESTHESIA:   regional and MAC ? ?EBL:  25 mL  ? ?BLOOD ADMINISTERED:none ? ?DRAINS: none  ? ?LOCAL MEDICATIONS USED:  NONE ? ?SPECIMEN:  No Specimen ? ?DISPOSITION OF SPECIMEN:  N/A ? ?COUNTS:  YES ? ?TOURNIQUET:  * Missing tourniquet times found for documented tourniquets in log: [id number] * ? ?DICTATION: .Dragon Dictation ? ?PLAN OF CARE: Discharge to home after PACU ? ?PATIENT DISPOSITION:  PACU - hemodynamically stable. ?  ?Delay start of Pharmacological VTE agent (>24hrs) due to surgical blood loss or risk of bleeding: not applicable ? ?

## 2021-11-18 NOTE — Anesthesia Procedure Notes (Signed)
Anesthesia Regional Block: Supraclavicular block  ? ?Pre-Anesthetic Checklist: , timeout performed,  Correct Patient, Correct Site, Correct Laterality,  Correct Procedure, Correct Position, site marked,  Risks and benefits discussed,  Surgical consent,  Pre-op evaluation,  At surgeon's request and post-op pain management ? ?Laterality: Left ? ?Prep: chloraprep     ?  ?Needles:  ?Injection technique: Single-shot ? ?Needle Type: Echogenic Stimulator Needle   ? ? ?Needle Length: 10cm  ?Needle Gauge: 20  ? ? ? ?Additional Needles: ? ? ?Procedures:,,,, ultrasound used (permanent image in chart),,    ?Narrative:  ?Start time: 11/18/2021 10:18 AM ?End time: 11/18/2021 10:22 AM ?Injection made incrementally with aspirations every 5 mL. ? ?Performed by: Personally  ?Anesthesiologist: Lidia Collum, MD ? ?Additional Notes: ?Standard monitors applied. Skin prepped. Good needle visualization with ultrasound. Injection made in 5cc increments with no resistance to injection. Patient tolerated the procedure well. ? ? ? ? ?

## 2021-11-18 NOTE — Discharge Instructions (Signed)
? ? ?Isabel Berger, M.D. ?Hand Surgery ? ?POST-OPERATIVE DISCHARGE INSTRUCTIONS ? ? ?PRESCRIPTIONS: ?You may have been given a prescription to be taken as directed for post-operative pain control.  You may also take over the counter ibuprofen/aleve and tylenol for pain. Take this as directed on the packaging. Do not exceed 3000 mg tylenol/acetaminophen in 24 hours. ? ?Ibuprofen 600-800 mg (3-4) tablets by mouth every 6 hours as needed for pain.  ?OR ?Aleve 2 tablets by mouth every 12 hours (twice daily) as needed for pain.  ?AND/OR ?Tylenol 1000 mg (2 tablets) every 8 hours as needed for pain. ? ?Please use your pain medication carefully, as refills are limited and you may not be provided with one.  As stated above, please use over the counter pain medicine - it will also be helpful with decreasing your swelling.  ? ? ?ANESTHESIA: ?After your surgery, post-surgical discomfort or pain is likely. This discomfort can last several days to a few weeks. At certain times of the day your discomfort may be more intense.  ? ?Did you receive a nerve block?  ?A nerve block can provide pain relief for one hour to two days after your surgery. As long as the nerve block is working, you will experience little or no sensation in the area the surgeon operated on.  ?As the nerve block wears off, you will begin to experience pain or discomfort. It is very important that you begin taking your prescribed pain medication before the nerve block fully wears off. Treating your pain at the first sign of the block wearing off will ensure your pain is better controlled and more tolerable when full-sensation returns. Do not wait until the pain is intolerable, as the medicine will be less effective. It is better to treat pain in advance than to try and catch up.  ? ?General Anesthesia:  ?If you did not receive a nerve block during your surgery, you will need to start taking your pain medication shortly after your surgery and should continue  to do so as prescribed by your surgeon.   ? ? ?ICE AND ELEVATION: ?You may use ice for the first 48-72 hours, but it is not critical.   ?Motion of your fingers is very important to decrease the swelling.  ?Elevation, as much as possible for the next 48 hours, is critical for decreasing swelling as well as for pain relief. Elevation means when you are seated or lying down, you hand should be at or above your heart. When walking, the hand needs to be at or above the level of your elbow.  ?If the bandage gets too tight, it may need to be loosened. Please contact our office and we will instruct you in how to do this.  ? ? ?SURGICAL BANDAGES:  ?Keep your dressing and/or splint clean and dry at all times.  Do not remove until you are seen again in the office.  If careful, you may place a plastic bag over your bandage and tape the end to shower, but be careful, do not get your bandages wet.  ?  ? ?HAND THERAPY:  ?You may not need any. If you do, we will begin this at your follow up visit in the clinic.  ? ? ?ACTIVITY AND WORK: ?You are encouraged to move any fingers which are not in the bandage.  ?You might miss a variable period of time from work and hopefully this issue has been discussed prior to surgery. You may not do any heavy  work with your affected hand for about 2 weeks.  ? ? ?[facility] ?[address] ?[place],  [place]  [address] ?[phone]  ?

## 2021-11-18 NOTE — Interval H&P Note (Signed)
History and Physical Interval Note: ? ?11/18/2021 ?11:06 AM ? ?Isabel Berger  has presented today for surgery, with the diagnosis of [place].  The various methods of treatment have been discussed with the patient and family. After consideration of risks, benefits and other options for treatment, the patient has consented to  Procedure(s): ?LEFT LATERAL EPICONDYLE DEBRIDEMENT, COMMON EXTENSOR TENDON REPAIR (Left) as a surgical intervention.  The patient's history has been reviewed, patient examined, no change in status, stable for surgery.  I have reviewed the patient's chart and labs.  Questions were answered to the patient's satisfaction.   ? ? ?[name] Verdean Murin ? ? ?

## 2021-11-18 NOTE — Anesthesia Postprocedure Evaluation (Signed)
Anesthesia Post Note ? ?Patient: Neelah Mannings [place] ? ?Procedure(s) Performed: LEFT LATERAL EPICONDYLE DEBRIDEMENT, COMMON EXTENSOR TENDON REPAIR (Left: Elbow) ? ?  ? ?Patient location during evaluation: PACU ?Anesthesia Type: Regional ?Level of consciousness: awake and alert ?Pain management: pain level controlled ?Vital Signs Assessment: post-procedure vital signs reviewed and stable ?Respiratory status: spontaneous breathing, nonlabored ventilation and respiratory function stable ?Cardiovascular status: blood pressure returned to baseline and stable ?Postop Assessment: no apparent nausea or vomiting ?Anesthetic complications: no ? ? ?No notable events documented. ? ?Last Vitals:  ?Vitals:  ? 11/18/21 1255 11/18/21 1310  ?BP: (!) 135/94 (!) 148/95  ?Pulse: 84 85  ?Resp: 18 17  ?Temp:  36.5 ?C  ?SpO2: 97% 99%  ?  ?Last Pain:  ?Vitals:  ? 11/18/21 1310  ?TempSrc:   ?PainSc: 0-No pain  ? ? ?  ?  ?  ?  ?  ?  ? ?Lidia Collum ? ? ? ? ?

## 2021-11-19 ENCOUNTER — Encounter: Payer: Self-pay | Admitting: Internal Medicine

## 2021-11-19 ENCOUNTER — Encounter (HOSPITAL_COMMUNITY): Payer: Self-pay | Admitting: Orthopedic Surgery

## 2021-11-20 ENCOUNTER — Other Ambulatory Visit: Payer: Self-pay | Admitting: Internal Medicine

## 2021-11-20 MED ORDER — GLIPIZIDE 5 MG PO TABS: 5.0000 mg | ORAL_TABLET | Freq: Two times a day (BID) | ORAL | 3 refills | Status: DC

## 2021-11-21 ENCOUNTER — Encounter (HOSPITAL_COMMUNITY): Payer: Self-pay | Admitting: Orthopedic Surgery

## 2021-11-24 ENCOUNTER — Telehealth: Payer: Self-pay

## 2021-11-24 ENCOUNTER — Other Ambulatory Visit (INDEPENDENT_AMBULATORY_CARE_PROVIDER_SITE_OTHER): Payer: 59 | Admitting: Orthopedic Surgery

## 2021-11-24 DIAGNOSIS — M7712 Lateral epicondylitis, left elbow: Secondary | ICD-10-CM

## 2021-11-24 MED ORDER — TRAMADOL HCL 50 MG PO TABS: 50.0000 mg | ORAL_TABLET | Freq: Four times a day (QID) | ORAL | 0 refills | Status: AC | PRN

## 2021-11-24 NOTE — Telephone Encounter (Signed)
Please advise 
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
## 2021-11-24 NOTE — Telephone Encounter (Signed)
Patient called stating that her left elbow has been itching for 3 days and that she is in excruciating pain and only has 3 pills left.   Stated that she has been wearing the sling, but when she takes it off, she has throbbing.  Patient had left elbow surgery on 11/18/2021.  Would like a call back to discuss?  CB# [phone].  Please advise.  Thank you. ?

## 2021-11-25 ENCOUNTER — Ambulatory Visit: Payer: 59 | Admitting: Radiology

## 2021-12-01 ENCOUNTER — Encounter: Payer: 59 | Admitting: Orthopedic Surgery

## 2021-12-02 ENCOUNTER — Ambulatory Visit (INDEPENDENT_AMBULATORY_CARE_PROVIDER_SITE_OTHER): Payer: 59 | Admitting: Orthopedic Surgery

## 2021-12-02 DIAGNOSIS — M7712 Lateral epicondylitis, left elbow: Secondary | ICD-10-CM
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
## 2021-12-09 ENCOUNTER — Other Ambulatory Visit: Payer: Self-pay

## 2021-12-09 ENCOUNTER — Ambulatory Visit (INDEPENDENT_AMBULATORY_CARE_PROVIDER_SITE_OTHER): Payer: 59 | Admitting: Rehabilitative and Restorative Service Providers"

## 2021-12-09 ENCOUNTER — Encounter: Payer: Self-pay | Admitting: Rehabilitative and Restorative Service Providers"

## 2021-12-09 DIAGNOSIS — M25532 Pain in left wrist: Secondary | ICD-10-CM

## 2021-12-09 DIAGNOSIS — R600 Localized edema: Secondary | ICD-10-CM | POA: Diagnosis not present

## 2021-12-09 DIAGNOSIS — M25622 Stiffness of left elbow, not elsewhere classified: Secondary | ICD-10-CM

## 2021-12-09 DIAGNOSIS — M25522 Pain in left elbow: Secondary | ICD-10-CM | POA: Diagnosis not present

## 2021-12-09 DIAGNOSIS — M6281 Muscle weakness (generalized): Secondary | ICD-10-CM | POA: Diagnosis not present
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
## 2021-12-11 ENCOUNTER — Encounter: Payer: Self-pay | Admitting: Rehabilitative and Restorative Service Providers"

## 2021-12-11 ENCOUNTER — Ambulatory Visit (INDEPENDENT_AMBULATORY_CARE_PROVIDER_SITE_OTHER): Payer: 59 | Admitting: Rehabilitative and Restorative Service Providers"

## 2021-12-11 DIAGNOSIS — M6281 Muscle weakness (generalized): Secondary | ICD-10-CM

## 2021-12-11 DIAGNOSIS — M25532 Pain in left wrist: Secondary | ICD-10-CM | POA: Diagnosis not present

## 2021-12-11 DIAGNOSIS — M25622 Stiffness of left elbow, not elsewhere classified: Secondary | ICD-10-CM

## 2021-12-11 DIAGNOSIS — M25522 Pain in left elbow: Secondary | ICD-10-CM | POA: Diagnosis not present

## 2021-12-11 DIAGNOSIS — R600 Localized edema: Secondary | ICD-10-CM | POA: Diagnosis not present
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
## 2021-12-17 ENCOUNTER — Encounter: Payer: Self-pay | Admitting: Rehabilitative and Restorative Service Providers"

## 2021-12-17 ENCOUNTER — Ambulatory Visit (INDEPENDENT_AMBULATORY_CARE_PROVIDER_SITE_OTHER): Payer: 59 | Admitting: Rehabilitative and Restorative Service Providers"

## 2021-12-17 DIAGNOSIS — M6281 Muscle weakness (generalized): Secondary | ICD-10-CM

## 2021-12-17 DIAGNOSIS — M25522 Pain in left elbow: Secondary | ICD-10-CM | POA: Diagnosis not present

## 2021-12-17 DIAGNOSIS — R600 Localized edema: Secondary | ICD-10-CM

## 2021-12-17 DIAGNOSIS — M25622 Stiffness of left elbow, not elsewhere classified: Secondary | ICD-10-CM

## 2021-12-17 DIAGNOSIS — M25532 Pain in left wrist: Secondary | ICD-10-CM
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
## 2021-12-19 ENCOUNTER — Ambulatory Visit (INDEPENDENT_AMBULATORY_CARE_PROVIDER_SITE_OTHER): Payer: 59 | Admitting: Rehabilitative and Restorative Service Providers"

## 2021-12-19 ENCOUNTER — Other Ambulatory Visit: Payer: Self-pay

## 2021-12-19 ENCOUNTER — Encounter: Payer: Self-pay | Admitting: Rehabilitative and Restorative Service Providers"

## 2021-12-19 DIAGNOSIS — M25522 Pain in left elbow: Secondary | ICD-10-CM

## 2021-12-19 DIAGNOSIS — R600 Localized edema: Secondary | ICD-10-CM | POA: Diagnosis not present

## 2021-12-19 DIAGNOSIS — M25532 Pain in left wrist: Secondary | ICD-10-CM

## 2021-12-19 DIAGNOSIS — M25622 Stiffness of left elbow, not elsewhere classified: Secondary | ICD-10-CM

## 2021-12-19 DIAGNOSIS — M6281 Muscle weakness (generalized): Secondary | ICD-10-CM

## 2021-12-19 DIAGNOSIS — R252 Cramp and spasm: Secondary | ICD-10-CM
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
## 2021-12-22 ENCOUNTER — Encounter: Payer: 59 | Admitting: Rehabilitative and Restorative Service Providers"

## 2021-12-22 ENCOUNTER — Telehealth: Payer: Self-pay | Admitting: Rehabilitative and Restorative Service Providers"

## 2021-12-22 MED ORDER — DICLOFENAC SODIUM 1 % EX GEL: 4.0000 g | Freq: Four times a day (QID) | CUTANEOUS | 1 refills | Status: DC

## 2021-12-22 NOTE — Telephone Encounter (Signed)
OT called patient to discuss missed appointment. OT left message about next appointment date/time. They were reminded of the attendance policy and future missed appointments could lead to early discharge from therapy. ? ?
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
## 2021-12-23 ENCOUNTER — Ambulatory Visit: Payer: 59 | Admitting: Family Medicine

## 2021-12-23 ENCOUNTER — Encounter: Payer: 59 | Admitting: Orthopedic Surgery

## 2021-12-24 ENCOUNTER — Encounter: Payer: 59 | Admitting: Rehabilitative and Restorative Service Providers"

## 2021-12-24 ENCOUNTER — Telehealth: Payer: Self-pay | Admitting: Rehabilitative and Restorative Service Providers"
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
## 2021-12-24 NOTE — Telephone Encounter (Signed)
OT called patient to discuss 2nd consecutive no-show for today's appointment. OT left message stating that all future appts are now cx (per policy) and she must call back to get rescheduled, if needed/desired.  ? ?

## 2021-12-29 ENCOUNTER — Encounter: Payer: 59 | Admitting: Rehabilitative and Restorative Service Providers"

## 2022-01-01 ENCOUNTER — Encounter: Payer: 59 | Admitting: Rehabilitative and Restorative Service Providers"

## 2022-01-02 ENCOUNTER — Ambulatory Visit (INDEPENDENT_AMBULATORY_CARE_PROVIDER_SITE_OTHER): Payer: 59 | Admitting: Family Medicine

## 2022-01-02 ENCOUNTER — Encounter: Payer: Self-pay | Admitting: Family Medicine

## 2022-01-02 VITALS — BP 154/103 | HR 84 | Ht 67.0 in | Wt 221.0 lb

## 2022-01-02 DIAGNOSIS — E1142 Type 2 diabetes mellitus with diabetic polyneuropathy: Secondary | ICD-10-CM | POA: Diagnosis not present

## 2022-01-02 DIAGNOSIS — E11620 Type 2 diabetes mellitus with diabetic dermatitis: Secondary | ICD-10-CM | POA: Diagnosis not present

## 2022-01-02 DIAGNOSIS — Z1231 Encounter for screening mammogram for malignant neoplasm of breast: Secondary | ICD-10-CM | POA: Diagnosis not present

## 2022-01-02 DIAGNOSIS — Z794 Long term (current) use of insulin: Secondary | ICD-10-CM

## 2022-01-02 DIAGNOSIS — I10 Essential (primary) hypertension: Secondary | ICD-10-CM

## 2022-01-02 MED ORDER — SHINGRIX 50 MCG/0.5ML IM SUSR: 0.5000 mL | Freq: Once | INTRAMUSCULAR | 0 refills | Status: AC

## 2022-01-02 NOTE — Patient Instructions (Signed)
-   Your blood pressure was elevated today.  If you have any symptoms of vision changes, chest pain, shortness of breath please follow-up sooner than later.  Restart your amlodipine along with your losartan. ?- I have prescribed gabapentin you can take this for nerve pain. ?- Follow-up in 2 weeks for blood pressure recheck and full physical exam ?
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
## 2022-01-02 NOTE — Assessment & Plan Note (Signed)
Refilled gabapentin and will follow up after 2 weeks of gabapentin. Consider switching to cymbalta if unresponsive to gabapentin and given new mood concerns.  ?

## 2022-01-02 NOTE — Assessment & Plan Note (Signed)
BP elevated today, will restart amlodipine and continue losartan. Will recheck in 2 weeks at physical exam.

## 2022-01-02 NOTE — Progress Notes (Signed)
I was personally present and re-performed the exam and MDM and verified the service and findings are accurately documented in the student's note. ?Kermit, DO ?01/02/22 ?10:09 PM ? ?

## 2022-01-05 ENCOUNTER — Other Ambulatory Visit: Payer: Self-pay | Admitting: Family Medicine

## 2022-01-05 DIAGNOSIS — E1165 Type 2 diabetes mellitus with hyperglycemia: Secondary | ICD-10-CM

## 2022-01-05 DIAGNOSIS — E119 Type 2 diabetes mellitus without complications: Secondary | ICD-10-CM

## 2022-01-13 ENCOUNTER — Ambulatory Visit (INDEPENDENT_AMBULATORY_CARE_PROVIDER_SITE_OTHER): Payer: 59

## 2022-01-13 ENCOUNTER — Ambulatory Visit (INDEPENDENT_AMBULATORY_CARE_PROVIDER_SITE_OTHER): Payer: 59 | Admitting: Podiatry

## 2022-01-13 DIAGNOSIS — M7752 Other enthesopathy of left foot: Secondary | ICD-10-CM

## 2022-01-13 DIAGNOSIS — M7662 Achilles tendinitis, left leg: Secondary | ICD-10-CM

## 2022-01-13 DIAGNOSIS — M19072 Primary osteoarthritis, left ankle and foot: Secondary | ICD-10-CM

## 2022-01-13 DIAGNOSIS — M21862 Other specified acquired deformities of left lower leg: Secondary | ICD-10-CM

## 2022-01-13 MED ORDER — MELOXICAM 15 MG PO TABS: 15.0000 mg | ORAL_TABLET | Freq: Every day | ORAL | 3 refills | Status: DC

## 2022-01-13 NOTE — Patient Instructions (Addendum)

## 2022-01-14 ENCOUNTER — Encounter: Payer: Self-pay | Admitting: Podiatry

## 2022-01-14 NOTE — Progress Notes (Signed)
  Subjective:  Patient ID: Isabel Berger, female    DOB: 1966/02/27,  MRN: [id number]  Chief Complaint  Patient presents with   Foot Problem    L foot has 2 bone spurs top of left foot and heel    56 y.o. female presents with the above complaint. History confirmed with patient.  The pain in the left foot is on the top of the mid arch feels like there is a lump there that shoots the pain settle down to her big toe she also has pain in the back of her heel.  Objective:  Physical Exam: warm, good capillary refill, no trophic changes or ulcerative lesions, normal DP and PT pulses, and normal sensory exam. Left Foot:  Palpable dorsal spur of the midfoot, percussion of this she has a radiating pain signal down to the big toe, some pain with manipulation of the midtarsal joint Right Foot: Palpable fullness and tenderness at the insertion of the Achilles tendon with gastrocnemius equinus  No images are attached to the encounter.  Radiographs: Multiple views x-ray of both feet: Visible dorsal spurring on the left midfoot with degenerative changes in the TMT joint, right foot there is a posterior calcaneal enthesophyte Assessment:   1. Bone spur of left foot   2. Achilles tendinitis of left lower extremity   3. Gastrocnemius equinus of left lower extremity   4. Arthritis of midtarsal joint of left foot      Plan:  Patient was evaluated and treated and all questions answered.  Discussed etiology and treatment options of the bone spur on the left foot in detail.  We discussed surgical and nonsurgical treatment.  Surgical we discussed the possibility of removing the bone spur, we discussed the risk of this would be further nerve entrapment from the surgery itself.  Advised to alternate lacing patterns to avoid compression of the Voltaren gel as needed.  Discussed the etiology and treatment options for Achilles tendinitis including stretching, formal physical therapy with an eccentric  exercises therapy plan, supportive shoegears such as a running shoe or sneaker, heel lifts, topical and oral medications.  We also discussed that I do not routinely perform injections in this area because of the risk of an increased damage or rupture of the tendon.  We also discussed the role of surgical treatment of this for patients who do not improve after exhausting non-surgical treatment options.  -XR reviewed with patient -Educated on stretching and icing of the affected limb. -Rx for Meloxicam. Advised on risks, benefits, and alternatives of the medication -Home physical therapy plan dispensed -Recommended felt heel lifts as well  No follow-ups on file.

## 2022-01-16 ENCOUNTER — Encounter: Payer: Self-pay | Admitting: Family Medicine

## 2022-01-16 ENCOUNTER — Ambulatory Visit (INDEPENDENT_AMBULATORY_CARE_PROVIDER_SITE_OTHER): Payer: 59 | Admitting: Family Medicine

## 2022-01-16 VITALS — BP 122/80 | HR 99 | Wt 219.8 lb

## 2022-01-16 DIAGNOSIS — I10 Essential (primary) hypertension: Secondary | ICD-10-CM

## 2022-01-16 MED ORDER — GABAPENTIN 100 MG PO CAPS: 100.0000 mg | ORAL_CAPSULE | Freq: Three times a day (TID) | ORAL | 3 refills | Status: DC

## 2022-01-16 MED ORDER — METOPROLOL SUCCINATE ER 50 MG PO TB24: 50.0000 mg | ORAL_TABLET | Freq: Every day | ORAL | 3 refills | Status: DC

## 2022-01-16 NOTE — Progress Notes (Unsigned)
    SUBJECTIVE:   CHIEF COMPLAINT / HPI:   Hypertension: - Medications: amlopdipine 5 mg daily, losartan 50 mg qhs - Compliance: Yes - Checking BP at home: Unknown - Endorses daily headaches since restarting amlodipine - Denies any SOB, CP, vision changes, LE edema, or symptoms of hypotension  PERTINENT  PMH / PSH: T2DM  OBJECTIVE:   BP 122/80   Pulse 99   Wt 219 lb 12.8 oz (99.7 kg)   SpO2 100%   BMI 34.43 kg/m   General: Appears well, no acute distress. Age appropriate. Cardiac: RRR, normal heart sounds, no murmurs Respiratory: CTAB, normal effort Extremities: No edema or cyanosis. Skin: Warm and dry, no rashes noted Neuro: alert and oriented, no focal deficits Psych: normal affect  ASSESSMENT/PLAN:   Essential hypertension Initially elevated. Daily headaches with amlodipine in the past and headaches returned when medication re-introduced. Will discontinue amlodipine due to intolerance and start metoprolol for continued BP control and migraine prophylaxis. Return in 2 weeks for BP check.   Lavonda Jumbo, DO [facility]

## 2022-01-16 NOTE — Patient Instructions (Signed)
[email or web address] for therapy Start metoprolol stop amlodipine. Follow up in 2 weeks for a blood pressure check.  Let us know if you continue to have headaches.

## 2022-01-20 ENCOUNTER — Inpatient Hospital Stay (HOSPITAL_BASED_OUTPATIENT_CLINIC_OR_DEPARTMENT_OTHER): Admission: RE | Admit: 2022-01-20 | Payer: 59 | Source: Ambulatory Visit

## 2022-01-27 ENCOUNTER — Encounter: Payer: Self-pay | Admitting: *Deleted

## 2022-01-27 ENCOUNTER — Ambulatory Visit: Payer: Self-pay

## 2022-01-27 ENCOUNTER — Ambulatory Visit (INDEPENDENT_AMBULATORY_CARE_PROVIDER_SITE_OTHER): Payer: 59 | Admitting: Orthopedic Surgery

## 2022-01-27 DIAGNOSIS — M7712 Lateral epicondylitis, left elbow: Secondary | ICD-10-CM

## 2022-01-27 DIAGNOSIS — M19022 Primary osteoarthritis, left elbow: Secondary | ICD-10-CM | POA: Insufficient documentation

## 2022-01-27 MED ORDER — PREDNISONE 10 MG (21) PO TBPK: ORAL_TABLET | ORAL | 0 refills | Status: DC
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
## 2022-01-30 ENCOUNTER — Ambulatory Visit (HOSPITAL_BASED_OUTPATIENT_CLINIC_OR_DEPARTMENT_OTHER): Admission: RE | Admit: 2022-01-30 | Payer: 59 | Source: Ambulatory Visit | Admitting: Radiology

## 2022-01-30 ENCOUNTER — Other Ambulatory Visit (HOSPITAL_BASED_OUTPATIENT_CLINIC_OR_DEPARTMENT_OTHER): Payer: Self-pay | Admitting: Family Medicine

## 2022-01-30 ENCOUNTER — Ambulatory Visit: Payer: 59 | Admitting: Orthopedic Surgery

## 2022-01-30 DIAGNOSIS — Z1231 Encounter for screening mammogram for malignant neoplasm of breast: Secondary | ICD-10-CM

## 2022-02-02 ENCOUNTER — Encounter: Payer: Self-pay | Admitting: Family Medicine

## 2022-02-02 ENCOUNTER — Other Ambulatory Visit: Payer: Self-pay | Admitting: Family Medicine

## 2022-02-02 DIAGNOSIS — Z794 Long term (current) use of insulin: Secondary | ICD-10-CM

## 2022-02-02 DIAGNOSIS — K3184 Gastroparesis: Secondary | ICD-10-CM

## 2022-02-03 MED ORDER — FAMOTIDINE 20 MG PO TABS: 20.0000 mg | ORAL_TABLET | Freq: Two times a day (BID) | ORAL | 2 refills | Status: DC

## 2022-02-04 ENCOUNTER — Other Ambulatory Visit: Payer: Self-pay | Admitting: Family Medicine

## 2022-02-04 DIAGNOSIS — Z794 Long term (current) use of insulin: Secondary | ICD-10-CM

## 2022-02-04 DIAGNOSIS — E119 Type 2 diabetes mellitus without complications: Secondary | ICD-10-CM

## 2022-02-05 ENCOUNTER — Other Ambulatory Visit (HOSPITAL_BASED_OUTPATIENT_CLINIC_OR_DEPARTMENT_OTHER): Payer: Self-pay | Admitting: Family Medicine

## 2022-02-05 ENCOUNTER — Ambulatory Visit (INDEPENDENT_AMBULATORY_CARE_PROVIDER_SITE_OTHER): Payer: 59 | Admitting: Family Medicine

## 2022-02-05 VITALS — BP 123/71 | HR 85 | Wt 222.0 lb

## 2022-02-05 DIAGNOSIS — I10 Essential (primary) hypertension: Secondary | ICD-10-CM | POA: Diagnosis not present

## 2022-02-05 DIAGNOSIS — Z1231 Encounter for screening mammogram for malignant neoplasm of breast: Secondary | ICD-10-CM

## 2022-02-05 DIAGNOSIS — R220 Localized swelling, mass and lump, head: Secondary | ICD-10-CM

## 2022-02-05 DIAGNOSIS — R519 Headache, unspecified: Secondary | ICD-10-CM | POA: Diagnosis not present

## 2022-02-05 MED ORDER — CARBAMAZEPINE ER 200 MG PO TB12: 200.0000 mg | ORAL_TABLET | Freq: Every day | ORAL | 0 refills | Status: DC

## 2022-02-05 NOTE — Patient Instructions (Addendum)
It was wonderful to see you today.  Please bring ALL of your medications with you to every visit.   Today we talked about:  Trigeminal Nueralgia- Start medication and follow up in 2 weeks. You can keep your dental appointment.   Please be sure to schedule follow up at the front  desk before you leave today.   Please call the clinic at [phone] if your symptoms worsen or you have any concerns. It was our pleasure to serve you.  Dr. Janus Molder

## 2022-02-05 NOTE — Progress Notes (Signed)
     SUBJECTIVE:   CHIEF COMPLAINT / HPI:   Left facial pain -Occurred a few days ago after biting down on food - Prior occurrence last year discovered cracked tooth and saw dentist but also placed on carbamazepine and was told that she has trigeminal neuralgia. - Aggravated by chewing, facial movements, touching - Associated symptoms of left eye tearing and left-sided headache - Denies tinnitus, facial paresthesia - Planning for dental appointment  Hypertension: - Medications: amlopdipine 5 mg daily, losartan 50 mg qhs - Compliance: Yes - Denies any SOB, CP, vision changes, LE edema, or symptoms of hypotension  PERTINENT  PMH / PSH: T2DM, neuropathy  OBJECTIVE:   BP 123/71   Pulse 85   Wt 222 lb (100.7 kg)   SpO2 100%   BMI 34.77 kg/m   Physical Exam Vitals reviewed.  Constitutional:      General: She is in acute distress.     Appearance: She is not ill-appearing, toxic-appearing or diaphoretic.  HENT:     Head: Normocephalic.     Right Ear: Tympanic membrane normal.     Left Ear: Tympanic membrane normal.     Mouth/Throat:     Dentition: Normal dentition.  Eyes:     General:        Left eye: No discharge.     Extraocular Movements: Extraocular movements intact.     Conjunctiva/sclera: Conjunctivae normal.     Pupils: Pupils are equal, round, and reactive to light.  Cardiovascular:     Rate and Rhythm: Normal rate and regular rhythm.     Heart sounds: Normal heart sounds.  Pulmonary:     Effort: Pulmonary effort is normal.     Breath sounds: Normal breath sounds.  Neurological:     General: No focal deficit present.     Mental Status: She is alert and oriented to person, place, and time.     Cranial Nerves: No cranial nerve deficit.     Sensory: No sensory deficit.     Gait: Gait normal.     Comments: Left facial decreased movement with smiling likely due to pain. Significant pain with left facial palpation.   Psychiatric:        Mood and Affect: Mood  normal.        Behavior: Behavior normal.    ASSESSMENT/PLAN:   Left facial pain Several days of significant pain that is interfering with eating and drinking.  Prior history of trigeminal neuralgia likely recurrence.  Lower suspicion for other issues such as stroke (lack of focal neural deficits), cluster headache (additional symptoms would not explain this and headache is sustained), Horner syndrome (PERRL, lack of other associated symtoms).  I do not see any abnormal dentition at this time although can follow-up with dentist if desired.  We will start medication below and obtain electrolyte panel today.  Patient encouraged to follow-up in several weeks to repeat sodium check and monitor symptoms. - carbamazepine (TEGRETOL-XR) 200 MG 12 hr tablet; Take 1 tablet (200 mg total) by mouth daily.  Dispense: 30 tablet; Refill: 0 - Comprehensive metabolic panel  Essential hypertension Chronic. Well controlled.  -Continue current meds  Gerlene Fee, [place]

## 2022-02-06 ENCOUNTER — Ambulatory Visit (HOSPITAL_BASED_OUTPATIENT_CLINIC_OR_DEPARTMENT_OTHER)
Admission: RE | Admit: 2022-02-06 | Discharge: 2022-02-06 | Disposition: A | Discharge status: Home / Self Care | Payer: 59 | Source: Ambulatory Visit | Attending: Family Medicine | Admitting: Family Medicine

## 2022-02-06 DIAGNOSIS — Z1231 Encounter for screening mammogram for malignant neoplasm of breast: Secondary | ICD-10-CM | POA: Diagnosis present

## 2022-02-06 LAB — COMPREHENSIVE METABOLIC PANEL
ALT: 22 IU/L (ref 0–32)
AST: 19 IU/L (ref 0–40)
Albumin/Globulin Ratio: 1.6 (ref 1.2–2.2)
Albumin: 4.5 g/dL (ref 3.8–4.9)
Alkaline Phosphatase: 121 IU/L (ref 44–121)
BUN/Creatinine Ratio: 19 (ref 9–23)
BUN: 14 mg/dL (ref 6–24)
Bilirubin Total: <0.2 mg/dL (ref 0.0–1.2)
CO2: 21 mmol/L (ref 20–29)
Calcium: 9.8 mg/dL (ref 8.7–10.2)
Chloride: 105 mmol/L (ref 96–106)
Creatinine, Ser: 0.74 mg/dL (ref 0.57–1.00)
Globulin, Total: 2.8 g/dL (ref 1.5–4.5)
Glucose: 82 mg/dL (ref 70–99)
Potassium: 4.2 mmol/L (ref 3.5–5.2)
Sodium: 144 mmol/L (ref 134–144)
Total Protein: 7.3 g/dL (ref 6.0–8.5)
eGFR: 96 mL/min/{1.73_m2} (ref >59)

## 2022-02-07 ENCOUNTER — Emergency Department (HOSPITAL_COMMUNITY)
Admission: EM | Admit: 2022-02-07 | Discharge: 2022-02-08 | Disposition: A | Discharge status: Home / Self Care | Payer: 59 | Attending: Emergency Medicine | Admitting: Emergency Medicine

## 2022-02-07 ENCOUNTER — Other Ambulatory Visit: Payer: Self-pay

## 2022-02-07 ENCOUNTER — Encounter (HOSPITAL_COMMUNITY): Payer: Self-pay | Admitting: Emergency Medicine

## 2022-02-07 DIAGNOSIS — Z5321 Procedure and treatment not carried out due to patient leaving prior to being seen by health care provider: Secondary | ICD-10-CM | POA: Diagnosis not present

## 2022-02-07 DIAGNOSIS — R519 Headache, unspecified: Secondary | ICD-10-CM | POA: Diagnosis not present

## 2022-02-07 LAB — I-STAT BETA HCG BLOOD, ED (MC, WL, AP ONLY): I-stat hCG, quantitative: <5 m[IU]/mL (ref <5)

## 2022-02-07 LAB — CBC WITH DIFFERENTIAL/PLATELET
Abs Immature Granulocytes: 0.03 10*3/uL (ref 0.00–0.07)
Basophils Absolute: 0.1 10*3/uL (ref 0.0–0.1)
Basophils Relative: 1 %
Eosinophils Absolute: 0.2 10*3/uL (ref 0.0–0.5)
Eosinophils Relative: 2 %
HCT: 36.9 % (ref 36.0–46.0)
Hemoglobin: 11.9 g/dL — ABNORMAL LOW (ref 12.0–15.0)
Immature Granulocytes: 0 %
Lymphocytes Relative: 26 %
Lymphs Abs: 2.6 10*3/uL (ref 0.7–4.0)
MCH: 30.1 pg (ref 26.0–34.0)
MCHC: 32.2 g/dL (ref 30.0–36.0)
MCV: 93.2 fL (ref 80.0–100.0)
Monocytes Absolute: 0.8 10*3/uL (ref 0.1–1.0)
Monocytes Relative: 8 %
Neutro Abs: 6.4 10*3/uL (ref 1.7–7.7)
Neutrophils Relative %: 63 %
Platelets: 390 10*3/uL (ref 150–400)
RBC: 3.96 MIL/uL (ref 3.87–5.11)
RDW: 12.7 % (ref 11.5–15.5)
WBC: 10 10*3/uL (ref 4.0–10.5)
nRBC: 0 % (ref 0.0–0.2)

## 2022-02-07 LAB — BASIC METABOLIC PANEL
Anion gap: 9 (ref 5–15)
BUN: 11 mg/dL (ref 6–20)
CO2: 22 mmol/L (ref 22–32)
Calcium: 9 mg/dL (ref 8.9–10.3)
Chloride: 110 mmol/L (ref 98–111)
Creatinine, Ser: 0.81 mg/dL (ref 0.44–1.00)
GFR, Estimated: >60 mL/min (ref >60)
Glucose, Bld: 142 mg/dL — ABNORMAL HIGH (ref 70–99)
Potassium: 3.8 mmol/L (ref 3.5–5.1)
Sodium: 141 mmol/L (ref 135–145)

## 2022-02-07 NOTE — ED Triage Notes (Addendum)
Patient arrived with EMS from home reports migraine headache this evening , she adds head injury this morning - accidentally hit her head against her truck's window , no LOC , no emesis / mild  photophobia .

## 2022-02-08 ENCOUNTER — Encounter: Payer: Self-pay | Admitting: Family Medicine

## 2022-02-08 NOTE — ED Notes (Signed)
Pt left due to not being seen quick enough 

## 2022-02-09 ENCOUNTER — Ambulatory Visit (INDEPENDENT_AMBULATORY_CARE_PROVIDER_SITE_OTHER): Payer: 59 | Admitting: Family Medicine

## 2022-02-09 VITALS — BP 142/88 | HR 122 | Ht 67.0 in | Wt 222.0 lb

## 2022-02-09 DIAGNOSIS — S0990XA Unspecified injury of head, initial encounter: Secondary | ICD-10-CM

## 2022-02-09 NOTE — Patient Instructions (Signed)
It was great seeing you today!  Im sorry you have been experiencing such bad headaches after your injury. I have ordered a head CT to check for bleeding. The headaches are likely due to concussion. You can use over the counter extra strength Tylenol or Ibuprofen.  We will call you with the CT results. I recommended scheduling an appointment with your PCP in the next 1-2 weeks for follow up and to also check on your trigeminal neuralgia   Feel free to call with any questions or concerns at any time, at [phone].   Take care,  Dr. Shary Key [facility]

## 2022-02-09 NOTE — Telephone Encounter (Signed)
Spoke with patient.   Patient reports she did hit her head on Friday morning on a "window." Patient reports the incident happened while working for "[employer]."   Patient reports she went to the ED for slurred speech, however waited ~7 hours and was not seen so she left.   Patient reports no more incidents of slurred speech, vision changes or confusion. No excessive sleepiness over the weekend.   Patient does endorse a headache this morning.   Patient scheduled for this afternoon for evaluation.   Precautions discussed in the meantime.

## 2022-02-09 NOTE — Progress Notes (Signed)
    SUBJECTIVE:   CHIEF COMPLAINT / HPI:   Ms. Lowy is a 56 yo who presents for evaluation of her head.   Patient reports she hit her head on Friday morning after a window fell no her head. Patient reports the incident happened while working for [employer].  Patient reports she went to the ED on Saturday for slurred speech, however waited ~7 hours and was not seen so she left.Denies LOC. Pain occurs in front of head and sometimes wraps around. States its constant some times worse than others. Has been in the bed a lot.  Patient reports no more incidents of slurred speech, vision changes or confusion.  Patient does endorse a headache this morning. Worse with light. Head hurts in the front area. Coughing exacerbates the pain. Feels nauseous but no throwing up. L eye blurry and watering for the past 3-4 weeks. No inciting injury at that time. Has not taken anything for the headache   Was seen previously for L facial pain due to trigeminal neuralgia. When she first takes Carbamazepine gets some relief. Takes 1 tablet daily. Denies facial pain currently. Cant have cold temperature because of the neuralgia    PERTINENT  PMH / PSH: Reviewed   OBJECTIVE:   BP (!) 142/88   Pulse (!) 122   Ht '5\' 7"'$  (1.702 m)   Wt 222 lb (100.7 kg)   SpO2 98%   BMI 34.77 kg/m    Physical exam General: in some pain with lights off in room, NAD Cardiovascular: Tachycardic, no murmurs Lungs: CTAB. Normal WOB Abdomen: soft, non-distended, non-tender Skin: warm, dry. No edema Neuro: Sensation changes on L lower face. Residual L sided UE weakness   ASSESSMENT/PLAN:   Head injury, acute Patient presents with persistent frontal headache since incurring head injury 3 days prior. Has some nausea, no vomiting, sensitive to light. Has not tried pain medication. Neuro exam with L lower facial sensation changes, L eye sensitivity to light, and L sided UE weakness which she states is baseline for her. She does have  trigeminal neuralgia which is likely contributing to the facial changes, but can not exclude an intracranial etiology so ordered head CT. Recommended follow up with PCP to check in on the headache and facial pain. Strict return precautions discussed.   Head injury Patient presents with persistent frontal headache since incurring head injury 3 days prior. Has some nausea, no vomiting, sensitive to light. Has not tried pain medication. Neuro exam with L lower facial sensation changes, L eye sensitivity to light, and L sided UE weakness which she states is baseline for her. She does have trigeminal neuralgia which is likely contributing to the facial changes, but can not exclude an intracranial etiology so ordered head CT. Recommended follow up with PCP to check in on the headache and facial pain. Strict return precautions discussed.   [place]

## 2022-02-10 DIAGNOSIS — S0990XA Unspecified injury of head, initial encounter: Secondary | ICD-10-CM | POA: Insufficient documentation

## 2022-02-10 NOTE — Assessment & Plan Note (Signed)
Patient presents with persistent frontal headache since incurring head injury 3 days prior. Has some nausea, no vomiting, sensitive to light. Has not tried pain medication. Neuro exam with L lower facial sensation changes, L eye sensitivity to light, and L sided UE weakness which she states is baseline for her. She does have trigeminal neuralgia which is likely contributing to the facial changes, but can not exclude an intracranial etiology so ordered head CT. Recommended follow up with PCP to check in on the headache and facial pain. Strict return precautions discussed.

## 2022-02-13 ENCOUNTER — Encounter (HOSPITAL_BASED_OUTPATIENT_CLINIC_OR_DEPARTMENT_OTHER): Payer: Self-pay

## 2022-02-13 ENCOUNTER — Other Ambulatory Visit: Payer: Self-pay

## 2022-02-13 ENCOUNTER — Emergency Department (HOSPITAL_BASED_OUTPATIENT_CLINIC_OR_DEPARTMENT_OTHER): Payer: 59

## 2022-02-13 ENCOUNTER — Emergency Department (HOSPITAL_BASED_OUTPATIENT_CLINIC_OR_DEPARTMENT_OTHER)
Admission: EM | Admit: 2022-02-13 | Discharge: 2022-02-13 | Disposition: A | Discharge status: Home / Self Care | Payer: 59 | Attending: Emergency Medicine | Admitting: Emergency Medicine

## 2022-02-13 ENCOUNTER — Encounter: Payer: Self-pay | Admitting: Family Medicine

## 2022-02-13 DIAGNOSIS — E119 Type 2 diabetes mellitus without complications: Secondary | ICD-10-CM | POA: Diagnosis not present

## 2022-02-13 DIAGNOSIS — H53149 Visual discomfort, unspecified: Secondary | ICD-10-CM | POA: Insufficient documentation

## 2022-02-13 DIAGNOSIS — R519 Headache, unspecified: Secondary | ICD-10-CM | POA: Insufficient documentation

## 2022-02-13 DIAGNOSIS — W228XXA Striking against or struck by other objects, initial encounter: Secondary | ICD-10-CM | POA: Diagnosis not present

## 2022-02-13 DIAGNOSIS — R112 Nausea with vomiting, unspecified: Secondary | ICD-10-CM | POA: Insufficient documentation

## 2022-02-13 DIAGNOSIS — Z7984 Long term (current) use of oral hypoglycemic drugs: Secondary | ICD-10-CM | POA: Diagnosis not present

## 2022-02-13 DIAGNOSIS — I10 Essential (primary) hypertension: Secondary | ICD-10-CM | POA: Diagnosis not present

## 2022-02-13 DIAGNOSIS — S0990XA Unspecified injury of head, initial encounter: Secondary | ICD-10-CM

## 2022-02-13 DIAGNOSIS — Z79899 Other long term (current) drug therapy: Secondary | ICD-10-CM | POA: Diagnosis not present

## 2022-02-13 DIAGNOSIS — Z794 Long term (current) use of insulin: Secondary | ICD-10-CM | POA: Insufficient documentation

## 2022-02-13 LAB — COMPREHENSIVE METABOLIC PANEL
ALT: 19 U/L (ref 0–44)
AST: 19 U/L (ref 15–41)
Albumin: 4 g/dL (ref 3.5–5.0)
Alkaline Phosphatase: 97 U/L (ref 38–126)
Anion gap: 6 (ref 5–15)
BUN: 9 mg/dL (ref 6–20)
CO2: 25 mmol/L (ref 22–32)
Calcium: 8.9 mg/dL (ref 8.9–10.3)
Chloride: 107 mmol/L (ref 98–111)
Creatinine, Ser: 0.74 mg/dL (ref 0.44–1.00)
GFR, Estimated: >60 mL/min (ref >60)
Glucose, Bld: 103 mg/dL — ABNORMAL HIGH (ref 70–99)
Potassium: 3.4 mmol/L — ABNORMAL LOW (ref 3.5–5.1)
Sodium: 138 mmol/L (ref 135–145)
Total Bilirubin: 0.4 mg/dL (ref 0.3–1.2)
Total Protein: 7.8 g/dL (ref 6.5–8.1)

## 2022-02-13 LAB — CBC WITH DIFFERENTIAL/PLATELET
Abs Immature Granulocytes: 0.01 10*3/uL (ref 0.00–0.07)
Basophils Absolute: 0.1 10*3/uL (ref 0.0–0.1)
Basophils Relative: 1 %
Eosinophils Absolute: 0.2 10*3/uL (ref 0.0–0.5)
Eosinophils Relative: 3 %
HCT: 36.9 % (ref 36.0–46.0)
Hemoglobin: 12.6 g/dL (ref 12.0–15.0)
Immature Granulocytes: 0 %
Lymphocytes Relative: 29 %
Lymphs Abs: 2 10*3/uL (ref 0.7–4.0)
MCH: 30.8 pg (ref 26.0–34.0)
MCHC: 34.1 g/dL (ref 30.0–36.0)
MCV: 90.2 fL (ref 80.0–100.0)
Monocytes Absolute: 0.6 10*3/uL (ref 0.1–1.0)
Monocytes Relative: 8 %
Neutro Abs: 4.3 10*3/uL (ref 1.7–7.7)
Neutrophils Relative %: 59 %
Platelets: 294 10*3/uL (ref 150–400)
RBC: 4.09 MIL/uL (ref 3.87–5.11)
RDW: 12.5 % (ref 11.5–15.5)
WBC: 7.1 10*3/uL (ref 4.0–10.5)
nRBC: 0 % (ref 0.0–0.2)

## 2022-02-13 MED ORDER — METOCLOPRAMIDE HCL 5 MG/ML IJ SOLN
10.0000 mg | Freq: Once | INTRAMUSCULAR | Status: AC
  Administered 2022-02-13: 10 mg via INTRAVENOUS
  Filled 2022-02-13: qty 2

## 2022-02-13 MED ORDER — SODIUM CHLORIDE 0.9 % IV BOLUS
1000.0000 mL | Freq: Once | INTRAVENOUS | Status: AC
  Administered 2022-02-13: 1000 mL via INTRAVENOUS

## 2022-02-13 MED ORDER — KETOROLAC TROMETHAMINE 15 MG/ML IJ SOLN
15.0000 mg | Freq: Once | INTRAMUSCULAR | Status: AC
  Administered 2022-02-13: 15 mg via INTRAVENOUS
  Filled 2022-02-13: qty 1

## 2022-02-13 MED ORDER — DIPHENHYDRAMINE HCL 50 MG/ML IJ SOLN
50.0000 mg | Freq: Once | INTRAMUSCULAR | Status: AC
  Administered 2022-02-13: 50 mg via INTRAVENOUS
  Filled 2022-02-13: qty 1

## 2022-02-16 ENCOUNTER — Ambulatory Visit (HOSPITAL_COMMUNITY): Payer: 59

## 2022-02-16 ENCOUNTER — Encounter: Payer: Self-pay | Admitting: Family Medicine

## 2022-02-19 ENCOUNTER — Ambulatory Visit (INDEPENDENT_AMBULATORY_CARE_PROVIDER_SITE_OTHER): Payer: 59 | Admitting: Family Medicine

## 2022-02-19 ENCOUNTER — Encounter: Payer: Self-pay | Admitting: Family Medicine

## 2022-02-19 VITALS — BP 155/93 | HR 76 | Ht 67.0 in | Wt 220.8 lb

## 2022-02-19 DIAGNOSIS — G43709 Chronic migraine without aura, not intractable, without status migrainosus: Secondary | ICD-10-CM | POA: Diagnosis not present

## 2022-02-19 DIAGNOSIS — I10 Essential (primary) hypertension: Secondary | ICD-10-CM | POA: Diagnosis not present

## 2022-02-19 DIAGNOSIS — G500 Trigeminal neuralgia: Secondary | ICD-10-CM | POA: Diagnosis not present

## 2022-02-19 MED ORDER — LOSARTAN POTASSIUM 100 MG PO TABS: 100.0000 mg | ORAL_TABLET | Freq: Every day | ORAL | 3 refills | Status: DC

## 2022-02-19 NOTE — Patient Instructions (Signed)
It was wonderful to see you today.  Today we talked about:  -Referral to neurology.  -Stopping amlodipine. Increasing losartan to 100 mg daily -Follow up with pharmacy team in 2 weeks. Bring all of your medications.  Please be sure to schedule follow up at the front  desk before you leave today.   Please call the clinic at [phone] if your symptoms worsen or you have any concerns. It was our pleasure to serve you.  Dr. Janus Molder

## 2022-02-19 NOTE — Progress Notes (Signed)
    SUBJECTIVE:   CHIEF COMPLAINT / HPI:   Headache, Trigeminal neuralgia follow up -Chronic -Hx of recent head injury. Seen in ED 6/23. Normal head CT -Unsure if seen neurology in the past -Denies nausea and emesis since ED visit -Endorses ongoing headache and left facial pain -Recently seen by dentist and told nothing to fix for dental pain  Hypertension: - Medications: Amlodipine 5 mg daily, losartan 50 mg qhs, metoprolol 50 mg daily - Compliance: Yes, was asked to stop amlodipine in ED; could be causing headaches - Checking BP at home: Unknown - Denies any SOB, CP, LE edema, medication SEs, or symptoms of hypotension  PERTINENT  PMH / PSH: As above  OBJECTIVE:   BP (!) 155/93   Pulse 76   Ht '5\' 7"'$  (1.702 m)   Wt 220 lb 12.8 oz (100.2 kg)   SpO2 100%   BMI 34.58 kg/m   General: Appears well, no acute distress. Age appropriate. Cardiac: RRR, normal heart sounds, no murmurs Respiratory: CTAB, normal effort Abdomen: soft, nontender, nondistended Extremities: No edema or cyanosis. Skin: Warm and dry, no rashes noted Neuro:  CN II: PERRL CN III, IV,VI: EOMI CV V: Normal sensation in V1, V2, V3 CVII: Symmetric smile and brow raise CN VIII: Normal hearing CN IX,X: Symmetric palate raise  CN XI: 5/5 shoulder shrug CN XII: Symmetric tongue protrusion  UE and LE strength 5/5 2+ UE and LE reflexes  Normal sensation in UE and LE bilaterally  Psych: normal affect  ASSESSMENT/PLAN:    1. Trigeminal neuralgia of left side of face Has improved some. BMP wnl at ED.  - Continue tegretol - Avoid triggers - Ambulatory referral to Neurology  2. Chronic migraine without aura without status migrainosus, not intractable Normal CT at ED visit. Told to stop amlodipine (could be inducing headache).  - Discontinue amlodipine  - Continue metoprolol - Ambulatory referral to Neurology - Consider triptan abortive therapy if migraines worsen prior to neuro follow up  3. Essential  hypertension Elevated on repeat. - Discontinue amlodipine - Continue metoprolol - Increase losartan (COZAAR) 100 MG tablet; Take 1 tablet (100 mg total) by mouth at bedtime.  Dispense: 90 tablet; Refill: 3 - Follow up in 2 weeks with pharmacy bring  medications, needs to check BP and BMP     Gerlene Fee, [place]

## 2022-02-23 ENCOUNTER — Ambulatory Visit (HOSPITAL_COMMUNITY): Payer: 59

## 2022-02-23 ENCOUNTER — Encounter: Payer: Self-pay | Admitting: Neurology

## 2022-03-03 ENCOUNTER — Ambulatory Visit (INDEPENDENT_AMBULATORY_CARE_PROVIDER_SITE_OTHER): Payer: Self-pay | Admitting: Podiatry

## 2022-03-03 DIAGNOSIS — Z91199 Patient's noncompliance with other medical treatment and regimen due to unspecified reason: Secondary | ICD-10-CM

## 2022-03-04 NOTE — Progress Notes (Signed)
Patient was no-show for appointment today 

## 2022-03-09 ENCOUNTER — Other Ambulatory Visit: Payer: 59

## 2022-03-09 ENCOUNTER — Ambulatory Visit: Payer: 59 | Admitting: Pharmacist

## 2022-03-11 ENCOUNTER — Other Ambulatory Visit: Payer: Self-pay | Admitting: Family Medicine

## 2022-03-11 DIAGNOSIS — E119 Type 2 diabetes mellitus without complications: Secondary | ICD-10-CM

## 2022-03-11 DIAGNOSIS — Z794 Long term (current) use of insulin: Secondary | ICD-10-CM

## 2022-03-12 ENCOUNTER — Ambulatory Visit (INDEPENDENT_AMBULATORY_CARE_PROVIDER_SITE_OTHER): Payer: 59 | Admitting: Pharmacist

## 2022-03-12 ENCOUNTER — Other Ambulatory Visit: Payer: 59

## 2022-03-12 ENCOUNTER — Encounter: Payer: Self-pay | Admitting: Pharmacist

## 2022-03-12 VITALS — BP 155/89 | HR 93 | Ht 68.0 in | Wt 222.4 lb

## 2022-03-12 DIAGNOSIS — R519 Headache, unspecified: Secondary | ICD-10-CM

## 2022-03-12 DIAGNOSIS — S0990XA Unspecified injury of head, initial encounter: Secondary | ICD-10-CM

## 2022-03-12 DIAGNOSIS — I10 Essential (primary) hypertension: Secondary | ICD-10-CM | POA: Diagnosis not present

## 2022-03-12 DIAGNOSIS — E1165 Type 2 diabetes mellitus with hyperglycemia: Secondary | ICD-10-CM

## 2022-03-12 DIAGNOSIS — E119 Type 2 diabetes mellitus without complications: Secondary | ICD-10-CM

## 2022-03-12 DIAGNOSIS — Z794 Long term (current) use of insulin: Secondary | ICD-10-CM

## 2022-03-12 MED ORDER — OLMESARTAN MEDOXOMIL 40 MG PO TABS: 40.0000 mg | ORAL_TABLET | Freq: Every day | ORAL | 11 refills | Status: DC

## 2022-03-12 MED ORDER — TRESIBA FLEXTOUCH 200 UNIT/ML ~~LOC~~ SOPN: 50.0000 [IU] | PEN_INJECTOR | Freq: Every day | SUBCUTANEOUS | 0 refills | Status: DC

## 2022-03-12 MED ORDER — OZEMPIC (2 MG/DOSE) 8 MG/3ML ~~LOC~~ SOPN: PEN_INJECTOR | SUBCUTANEOUS | 11 refills | Status: DC

## 2022-03-12 MED ORDER — FREESTYLE LIBRE 2 SENSOR MISC: 1.0000 | 3 refills | Status: DC

## 2022-03-12 MED ORDER — CARBAMAZEPINE ER 200 MG PO TB12: 200.0000 mg | ORAL_TABLET | Freq: Two times a day (BID) | ORAL | 0 refills | Status: DC

## 2022-03-12 NOTE — Progress Notes (Signed)
Reviewed: I agree with Dr. Koval's documentation and management. 

## 2022-03-12 NOTE — Assessment & Plan Note (Signed)
>>  ASSESSMENT AND PLAN FOR TYPE 2 DIABETES MELLITUS WITH HYPERGLYCEMIA, WITH LONG-TERM CURRENT USE OF INSULIN ([place]) WRITTEN ON 03/12/2022 12:54 PM BY KOVAL, PETER G, RPH-CPP  Diabetes longstanding currently uncontrolled. Patient is able to verbalize appropriate hypoglycemia management plan. Medication adherence appears ok. Control is suboptimal due to hypoglycemic events. -Decreased dose of basal insulin Tresiba (insulin degludec) from 66 units to 50 units given recurrent hypoglycemic events. [facility]. -Increased dose of GLP-1 Ozempic (generic semaglutide) to 2 mg weekly. Prescription sent to the [facility]. -Continued metformin 1000 mg BID. -Continued glipizide 5 mg BID. -Sample CGM FreeStyle Libre 2 sensor was placed today. Refills for additional sensors sent to pharmacy. Discussed using adhesive films with CGM.  -Extensively discussed pathophysiology of diabetes, recommended lifestyle interventions, dietary effects on blood sugar control.  -Counseled on s/sx of and management of hypoglycemia.

## 2022-03-12 NOTE — Progress Notes (Deleted)
S:     No chief complaint on file.  Isabel Berger is a 56 y.o. female who presents for diabetes evaluation, education, and management.  PMH is significant for ***.  Patient was referred and last seen by Primary Care Provider, Dr. ***, on ***.  *** Patient was referred by *** on ***. Patient was last seen by Primary Care Provider, Dr. ***, on ***.  At last visit, ***.   Today, patient arrives in *** good spirits and presents without *** any assistance. ***  Patient reports Diabetes was diagnosed in ***.   Family/Social History: ***  Current diabetes medications include: *** Current hypertension medications include: *** Current hyperlipidemia medications include: ***  Patient reports adherence to taking all medications as prescribed.  *** Patient denies adherence with medications, reports missing *** medications *** times per week, on average.  Do you feel that your medications are working for you? {YES NO:22349} Have you been experiencing any side effects to the medications prescribed? {YES NO:22349} Do you have any problems obtaining medications due to transportation or finances? {YES [id number] Insurance coverage: ***  Patient {Actions; denies-reports:120008} hypoglycemic events.  Reported home fasting blood sugars: ***  Reported 2 hour post-meal/random blood sugars: ***.  Patient {Actions; denies-reports:120008} nocturia (nighttime urination).  Patient {Actions; denies-reports:120008} neuropathy (nerve pain). Patient {Actions; denies-reports:120008} visual changes. Patient {Actions; denies-reports:120008} self foot exams.   Patient reported dietary habits: Eats *** meals/day Breakfast: *** Lunch: *** Dinner: *** Snacks: *** Drinks: ***  Within the past 12 months, did you worry whether your food would run out before you got money to buy more? {YES NO:22349} Within the past 12 months, did the food you bought run out, and you didn't have money to get more? {YES  NO:22349} PHQ-9 Score: ***  Patient-reported exercise habits: ***   O:   ROS  Physical Exam  7 day average blood glucose: ***  *** CGM Download:  % Time CGM is active: ***% Average Glucose: *** mg/dL Glucose Management Indicator: ***  Glucose Variability: *** (goal <36%) Time in Goal:  - Time in range 70-180: ***% - Time above range: ***% - Time below range: ***% Observed patterns:   Lab Results  Component Value Date   HGBA1C 8.4 (A) 11/17/2021   Vitals:   03/12/22 1033 03/12/22 1054  BP: (!) 157/91 (!) 155/89  Pulse: 93   SpO2: 96%     Lipid Panel     Component Value Date/Time   CHOL 171 11/17/2021 1212   CHOL 203 (H) 06/10/2018 1038   TRIG 225.0 (H) 11/17/2021 1212   HDL 35.80 (L) 11/17/2021 1212   HDL 42 06/10/2018 1038   CHOLHDL 5 11/17/2021 1212   VLDL 45.0 (H) 11/17/2021 1212   LDLCALC 138 (H) 06/10/2018 1038   LDLDIRECT 117.0 11/17/2021 1212    Clinical Atherosclerotic Cardiovascular Disease (ASCVD): {YES/NO:21197} The 10-year ASCVD risk score (Arnett DK, et al., 2019) is: 26.6%   Values used to calculate the score:     Age: 22 years     Sex: Female     Is Non-Hispanic African American: Yes     Diabetic: Yes     Tobacco smoker: No     Systolic Blood Pressure: 322 mmHg     Is BP treated: Yes     HDL Cholesterol: 35.8 mg/dL     Total Cholesterol: 171 mg/dL   Patient is participating in a Managed Medicaid Plan:  {MM YES/NO:27447::"Yes"}   A/P: Diabetes longstanding *** currently ***.  Patient is *** able to verbalize appropriate hypoglycemia management plan. Medication adherence appears ***. Control is suboptimal due to ***. -{Meds adjust:18428} basal insulin *** (insulin ***). Patient will continue to titrate 1 unit every *** days if fasting blood sugar > '100mg'$ /dl until fasting blood sugars reach goal or next visit.  -{Meds adjust:18428} rapid insulin *** (insulin ***) to ***.  -{Meds adjust:18428} GLP-1 *** (generic ***) to ***.  -{Meds  adjust:18428} SGLT2-I *** (generic ***) to ***. Counseled on sick day rules. -{Meds adjust:18428} metformin *** to ***.  -Patient educated on purpose, proper use, and potential adverse effects of ***.  -Extensively discussed pathophysiology of diabetes, recommended lifestyle interventions, dietary effects on blood sugar control.  -Counseled on s/sx of and management of hypoglycemia.  -Next A1c anticipated ***.   ASCVD risk - primary ***secondary prevention in patient with diabetes. Last LDL is *** not at goal of <70 *** mg/dL. ASCVD risk factors include *** and 10-year ASCVD risk score of ***. {Desc; low/moderate/high:110033} intensity statin indicated.  -{Meds adjust:18428} ***statin *** mg.   Hypertension longstanding *** currently ***. Blood pressure goal of <130/80 *** mmHg. Medication adherence ***. Blood pressure control is suboptimal due to ***. -***  Written patient instructions provided. Patient verbalized understanding of treatment plan.  Total time in face to face counseling *** minutes.    Follow-up:  Pharmacist ***. PCP clinic visit in ***.  Patient seen with ***.

## 2022-03-12 NOTE — Patient Instructions (Signed)
It was nice to see you today!  Your goal blood sugar is 80-130 before eating and less than 180 after eating.  Medication Changes: Begin olmesartan 40 mg daily  Adjust Tresiba (insulin degludec) to 50 units daily  Increase carbamazepine to 200 mg twice daily  Increase Ozempic (semaglutide) to 2 mg weekly   Discontinue losartan 100 mg daily   Monitor blood sugars at home and keep a log (glucometer or piece of paper) to bring with you to your next visit.  Keep up the good work with diet and exercise. Aim for a diet full of vegetables, fruit and lean meats (chicken, [place], fish). Try to limit salt intake by eating fresh or frozen vegetables (instead of canned), rinse canned vegetables prior to cooking and do not add any additional salt to meals.

## 2022-03-12 NOTE — Assessment & Plan Note (Signed)
Diabetes longstanding currently uncontrolled. Patient is able to verbalize appropriate hypoglycemia management plan. Medication adherence appears ok. Control is suboptimal due to hypoglycemic events. -Decreased dose of basal insulin Tresiba (insulin degludec) from 66 units to 50 units given recurrent hypoglycemic events. Prescription sent to the [facility]. -Increased dose of GLP-1 Ozempic (generic semaglutide) to 2 mg weekly. Prescription sent to the [facility] Department. -Continued metformin 1000 mg BID. -Continued glipizide 5 mg BID. -Sample CGM FreeStyle Libre 2 sensor was placed today. Refills for additional sensors sent to pharmacy. Discussed using adhesive films with CGM.  -Extensively discussed pathophysiology of diabetes, recommended lifestyle interventions, dietary effects on blood sugar control.  -Counseled on s/sx of and management of hypoglycemia.

## 2022-03-12 NOTE — Assessment & Plan Note (Signed)
Hypertension longstanding currently uncontrolled on current medications. BP goal < 130/80 mmHg. Medication adherence appears ok. Control is suboptimal due to medication regimen not being optimized.  -Discontinued losartan 100 mg daily. -Started olmesartan 40 mg daily for greater blood pressure reduction. -Consider restarting amlodipine as patient's headaches did not improve when amlodipine was held. -Patient educated on purpose, proper use, and potential adverse effects of olmesartan.  -F/u labs ordered - BMET collected today. -Counseled on lifestyle modifications for blood pressure control including reduced dietary sodium, increased exercise, adequate sleep. -Encouraged patient to check BP at home and bring log of readings to next visit. Counseled on proper use of home BP cuff.

## 2022-03-12 NOTE — Assessment & Plan Note (Signed)
Chronic headache with prior history of head trauma and trigeminal neuralgia currently symptomatic due to recent head injury. Control suboptimal due to dosing of carbamazepine. Patient previously on carbamazepine 200 mg BID in 2021. -Increase carbamazepine to 200 mg BID.

## 2022-03-12 NOTE — Progress Notes (Addendum)
S:    Chief Complaint  Patient presents with   Diabetes, hypertension, headache   Isabel Berger is a 56 y.o. female who presents for hypertension evaluation, education, and management. PMH is significant for T2DM with peripheral neuropathy, HTN, and migraines. Patient was referred by Dr. Janus Molder on 02/19/22. At last visit, amlodipine was discontinued due to the possibility it was inducing headaches and losartan was increased from 50 mg to 100 mg daily.   Today, patient arrives in spirits and presents without assistance. Patient reports worsening headaches since being hit in the face with a branch while removing a tree in her front yard. Pain is 6/10 today. Since discontinuing amlodipine, her headaches have NOT decreased in frequency or intensity. She reports she has not been checking her blood glucose since her CGM fell off about a month ago. Prior to that, she was having hypoglycemic events 2-3x/week, with a lowest value of 49 mg/dL. Highest blood glucose seen was 400 mg/dL ~2 months ago. She is very interested in restarting her CGM, previously cost her $75/month.   Current diabetes medications include: Tresiba (insulin degludec) 66 units daily, glipizide 5 mg BID, metformin 1000 mg BID, Ozempic (semaglutide) 1 mg weekly Current hypertension medications include: losartan 100 mg, metoprolol succinate 50 mg daily  Current hyperlipidemia medications include: atorvastatin 10 mg daily  Patient reports hypoglycemic events.  Reported home fasting blood sugars: not checking since CGM fell off   O:  Review of Systems  Eyes:  Positive for photophobia (due to headache).  Neurological:  Positive for headaches (6/10 pain).  All other systems reviewed and are negative.   Physical Exam Constitutional:      Appearance: Normal appearance.  Neurological:     Mental Status: She is alert.  Psychiatric:        Mood and Affect: Mood normal.        Behavior: Behavior normal.    Last 3 Office  BP readings: BP Readings from Last 3 Encounters:  03/12/22 (!) 155/89  02/19/22 (!) 155/93  02/13/22 (!) 159/93    BMET    Component Value Date/Time   NA 138 02/13/2022 1742   NA 144 02/05/2022 1540   K 3.4 (L) 02/13/2022 1742   CL 107 02/13/2022 1742   CO2 25 02/13/2022 1742   GLUCOSE 103 (H) 02/13/2022 1742   GLUCOSE 101 06/21/2014 1113   BUN 9 02/13/2022 1742   BUN 14 02/05/2022 1540   CREATININE 0.74 02/13/2022 1742   CREATININE 0.73 01/04/2014 1701   CALCIUM 8.9 02/13/2022 1742   GFRNONAA >60 02/13/2022 1742   GFRNONAA >89 01/04/2014 1701   GFRAA 118 02/21/2020 1413   GFRAA >89 01/04/2014 1701   Clinical ASCVD: No  The 10-year ASCVD risk score (Arnett DK, et al., 2019) is: 26.6%   Values used to calculate the score:     Age: 23 years     Sex: Female     Is Non-Hispanic African American: Yes     Diabetic: Yes     Tobacco smoker: No     Systolic Blood Pressure: 128 mmHg     Is BP treated: Yes     HDL Cholesterol: 35.8 mg/dL     Total Cholesterol: 171 mg/dL   A/P: Diabetes longstanding currently uncontrolled. Patient is able to verbalize appropriate hypoglycemia management plan. Medication adherence appears ok. Control is suboptimal due to hypoglycemic events. -Decreased dose of basal insulin Tresiba (insulin degludec) from 66 units to 50 units given recurrent hypoglycemic events.  [facility] -Increased dose of GLP-1 Ozempic (generic semaglutide) to 2 mg weekly. [facility]cription sent [facility] the [facility]. -Continued metformin 1000 mg BID. -Continued glipizide 5 mg BID. -Sample CGM FreeStyle Libre 2 sensor was placed today. Refills for additional sensors sent to pharmacy. Discussed using adhesive films with CGM.  -Extensively discussed pathophysiology of diabetes, recommended lifestyle interventions, dietary effects on blood sugar control.  -Counseled on s/sx of and management of hypoglycemia.  -Next A1c anticipated at next office  visit.   Hypertension longstanding currently uncontrolled on current medications. BP goal < 130/80 mmHg. Medication adherence appears ok. Control is suboptimal due to medication regimen not being optimized.  -Discontinued losartan 100 mg daily. -Started olmesartan 40 mg daily for greater blood pressure reduction. -Consider restarting amlodipine as patient's headaches did not improve when amlodipine was held. -Patient educated on purpose, proper use, and potential adverse effects of olmesartan.  -F/u labs ordered - BMET collected today. -Counseled on lifestyle modifications for blood pressure control including reduced dietary sodium, increased exercise, adequate sleep. -Encouraged patient to check BP at home and bring log of readings to next visit. Counseled on proper use of home BP cuff.   Chronic headache with prior history of head trauma and trigeminal neuralgia currently symptomatic due to recent head injury. Control suboptimal due to dosing of carbamazepine. Patient previously on carbamazepine 200 mg BID in 2021. -Increase carbamazepine to 200 mg BID.   Results reviewed and written information provided.    Written patient instructions provided. Patient verbalized understanding of treatment plan.  Total time in face to face counseling 43 minutes.    Follow-up:  Pharmacist 1 month. PCP clinic visit in September.  Patient seen with Joseph Art, PharmD, PGY2 Pharmacy Resident.

## 2022-03-13 LAB — BASIC METABOLIC PANEL
BUN/Creatinine Ratio: 14 (ref 9–23)
BUN: 11 mg/dL (ref 6–24)
CO2: 21 mmol/L (ref 20–29)
Calcium: 9.3 mg/dL (ref 8.7–10.2)
Chloride: 102 mmol/L (ref 96–106)
Creatinine, Ser: 0.78 mg/dL (ref 0.57–1.00)
Glucose: 113 mg/dL — ABNORMAL HIGH (ref 70–99)
Potassium: 3.8 mmol/L (ref 3.5–5.2)
Sodium: 140 mmol/L (ref 134–144)
eGFR: 90 mL/min/{1.73_m2} (ref >59)

## 2022-03-13 NOTE — Progress Notes (Signed)
Phone call follow-up of BMET Shared that lab values were acceptable and in target ranges.   She reported some small amount of bleeding under LIbre CGM.   Discussed care and patient comfortable.   Resent Elenor Legato - Network engineer.

## 2022-03-16 ENCOUNTER — Telehealth: Payer: Self-pay | Admitting: Pharmacist

## 2022-03-16 DIAGNOSIS — Z794 Long term (current) use of insulin: Secondary | ICD-10-CM

## 2022-03-16 DIAGNOSIS — E119 Type 2 diabetes mellitus without complications: Secondary | ICD-10-CM

## 2022-03-16 DIAGNOSIS — E1165 Type 2 diabetes mellitus with hyperglycemia: Secondary | ICD-10-CM

## 2022-03-16 MED ORDER — TRESIBA FLEXTOUCH 200 UNIT/ML ~~LOC~~ SOPN: 34.0000 [IU] | PEN_INJECTOR | Freq: Every day | SUBCUTANEOUS | Status: DC

## 2022-03-16 NOTE — Telephone Encounter (Signed)
Noted and agree. 

## 2022-03-16 NOTE — Telephone Encounter (Signed)
Call from patient reporting low blood sugar readings since increasing dose of Ozempic from 1 to '2mg'$  weekly.   She has reduced her Tresiba (insulin degludec) as instructed at last vist from 68 to 50 units.  She reports blood sugars of 69 this AM, followed by 98, 120 and again she is in the 60s.   We discussed further management of hypoglycemia today.  Additionally, we agreed to reduce her Tyler Aas further to 34 units daily.   She was advised that if she was still having blood sugar readings < 100 on Wednesday, she should reduce further to 30 units and contact me for more discussion later this week.   She verbalized understanding of treatment plan.

## 2022-03-19 ENCOUNTER — Ambulatory Visit (INDEPENDENT_AMBULATORY_CARE_PROVIDER_SITE_OTHER): Payer: 59 | Admitting: Internal Medicine

## 2022-03-19 VITALS — BP 142/78 | HR 84 | Temp 97.3°F | Ht 67.0 in | Wt 221.6 lb

## 2022-03-19 DIAGNOSIS — Z794 Long term (current) use of insulin: Secondary | ICD-10-CM

## 2022-03-19 DIAGNOSIS — E119 Type 2 diabetes mellitus without complications: Secondary | ICD-10-CM

## 2022-03-19 DIAGNOSIS — E785 Hyperlipidemia, unspecified: Secondary | ICD-10-CM

## 2022-03-19 LAB — POCT GLYCOSYLATED HEMOGLOBIN (HGB A1C): Hemoglobin A1C: 6 % — AB (ref 4.0–5.6)

## 2022-03-19 MED ORDER — TRESIBA FLEXTOUCH 200 UNIT/ML ~~LOC~~ SOPN: 24.0000 [IU] | PEN_INJECTOR | Freq: Every day | SUBCUTANEOUS | 3 refills | Status: DC

## 2022-03-19 NOTE — Progress Notes (Signed)
Name: Isabel Berger [facility]  MRN/ DOB: [id number], January 25, 1966   Age/ Sex: 56 y.o., female    PCP: Holley Bouche, MD   Reason for Endocrinology Evaluation: Type 2 Diabetes Mellitus     Date of Initial Endocrinology Visit: 11/17/2021    PATIENT IDENTIFIER: [name] is a 56 y.o. female with a past medical history of T2DM, dyslipidemia, trigeminal neuralgia. The patient presented for initial endocrinology clinic visit on 11/17/2021 for consultative assistance with her diabetes management.    HPI: [name] was    Diagnosed with DM in 2015 Prior Medications tried/Intolerance: Metformin-GI side effects            Hemoglobin A1c has ranged from 5.4% in 2022, peaking at > 15.0% in 2019.   On her initial visit to our clinic her A1c was 8.4%, she was on [place] which we increased   She was started on glipizide in March 2023 due to persistent hyperglycemia   SUBJECTIVE:   During the last visit (11/17/2021): A1c 8.4 %   Today (03/19/22): [name] is here for follow-up on diabetes management.  She checks her blood sugars multiple  times daily. The patient has  had hypoglycemic episodes since the last clinic visit, which typically occur during the day and night. The patient is symptomatic with these episodes.  She had an ED visit for migraine headaches on February 13, 2022  Denies nausea, vomiting or diarrhea   HOME DIABETES REGIMEN: Glipizide 5 mg twice daily Ozempic 2 mg weekly Tresiba 34 units daily Atorvastatin 10 mg daily    Statin:yes ACE-I/ARB: Yes Prior Diabetic Education: Yes    CONTINUOUS GLUCOSE MONITORING RECORD INTERPRETATION    Dates of Recording: 7/14-7/27/2023  Sensor description:freestyle libre 2  Results statistics:   CGM use % of time 43  Average and SD 89/20.8  Time in range     90   %  % Time Above 180 0  % Time above 250 0  % Time Below target 10      Glycemic patterns summary: Optimal BG's with recurrent  hypoglycemia  Hyperglycemic episodes  n/a  Hypoglycemic episodes occurred frequent  Overnight periods: trends down     DIABETIC COMPLICATIONS: Microvascular complications:   Denies: CKD, neuropathy, retinopathy Last eye exam: Completed   Macrovascular complications:   Denies: CAD, PVD, CVA   PAST HISTORY: Past Medical History:  Past Medical History:  Diagnosis Date   Allergy    pollen   Asthma    as a child   Constipation    Depression    Diabetes mellitus    Family history of adverse reaction to anesthesia    Mother- N/V   Headache    Hypertension    Past Surgical History:  Past Surgical History:  Procedure Laterality Date   ABDOMINAL HYSTERECTOMY     FOOT SURGERY Bilateral 2008 x 3   Dr Meridee Score    TENNIS ELBOW RELEASE/NIRSCHEL PROCEDURE Left 11/18/2021   Procedure: LEFT LATERAL EPICONDYLE DEBRIDEMENT, COMMON [place];  Surgeon: Sherilyn Cooter, MD;  Location: [facility];  Service: Orthopedics;  Laterality: Left;   TOTAL ABDOMINAL HYSTERECTOMY W/ BILATERAL SALPINGOOPHORECTOMY  2004   fibroids    Social History:  reports that she has never smoked. She has never used smokeless tobacco. She reports that she does not currently use alcohol. She reports that she does not use drugs. Family History:  Family History  Problem Relation Age of Onset   Diabetes Mother  Hypertension Mother    Stroke Mother    Breast cancer Maternal Aunt 69   Colon cancer Neg Hx    Colon polyps Neg Hx    Esophageal cancer Neg Hx    Rectal cancer Neg Hx    Stomach cancer Neg Hx      HOME MEDICATIONS: Allergies as of 03/19/2022       Reactions   Amlodipine Other (See Comments)   Headaches        Medication List        Accurate as of March 19, 2022 11:45 AM. If you have any questions, ask your nurse or doctor.          acetaminophen 500 MG tablet Commonly known as: TYLENOL Take 1,000 mg by mouth every 6 (six) hours as needed for moderate pain.    atorvastatin 10 MG tablet Commonly known as: LIPITOR Take 1 tablet (10 mg total) by mouth daily.   carbamazepine 200 MG 12 hr tablet Commonly known as: TEGretol-XR Take 1 tablet (200 mg total) by mouth 2 (two) times daily.   diclofenac 75 MG EC tablet Commonly known as: VOLTAREN Take 1 tablet (75 mg total) by mouth 2 (two) times daily.   diclofenac Sodium 1 % Gel Commonly known as: Voltaren Apply 4 g topically 4 (four) times daily.   famotidine 20 MG tablet Commonly known as: PEPCID Take 1 tablet (20 mg total) by mouth 2 (two) times daily.   freestyle lancets Use to check glucose three times daily   FreeStyle Libre 2 Sensor Misc 1 Device by Does not apply route every 14 (fourteen) days.   FREESTYLE TEST STRIPS test strip Generic drug: glucose blood Use to check glucose once daily   gabapentin 100 MG capsule Commonly known as: NEURONTIN Take 1 capsule (100 mg total) by mouth 3 (three) times daily. Take 100 mg twice a day and take 200-300 mg at bedtime.   glipiZIDE 5 MG tablet Commonly known as: GLUCOTROL Take 1 tablet (5 mg total) by mouth 2 (two) times daily before a meal.   LidoPure Patch 5 % Kit Apply 1 patch topically every 12 (twelve) hours as needed.   meloxicam 15 MG tablet Commonly known as: Mobic Take 1 tablet (15 mg total) by mouth daily.   metoprolol succinate 50 MG 24 hr tablet Commonly known as: TOPROL-XL Take 1 tablet (50 mg total) by mouth at bedtime. Take with or immediately following a meal.   nystatin powder Commonly known as: MYCOSTATIN/NYSTOP Apply topically 4 (four) times daily. What changed:  how much to take when to take this reasons to take this   olmesartan 40 MG tablet Commonly known as: BENICAR Take 1 tablet (40 mg total) by mouth daily.   Ozempic (2 MG/DOSE) 8 MG/3ML Sopn Generic drug: Semaglutide (2 MG/DOSE) Inject 2 mg into the skin once weekly.   Pen Needles 32G X 4 MM Misc 1 pen by Does not apply route daily.    Tyler Aas FlexTouch 200 UNIT/ML FlexTouch Pen Generic drug: insulin degludec Inject 34 Units into the skin daily.         ALLERGIES: Allergies  Allergen Reactions   Amlodipine Other (See Comments)    Headaches     REVIEW OF SYSTEMS: A comprehensive ROS was conducted with the patient and is negative except as per HPI and below:  Review of Systems  Gastrointestinal:  Negative for nausea and vomiting.      OBJECTIVE:   VITAL SIGNS: BP (!) 142/78  Pulse 84   Temp (!) 97.3 F (36.3 C)   Ht '5\' 7"'  (1.702 m)   Wt 221 lb 9.6 oz (100.5 kg)   SpO2 97%   BMI 34.71 kg/m    PHYSICAL EXAM:  General: Pt appears well and is in NAD  Lungs: Clear with good BS bilat with no rales, rhonchi, or wheezes  Heart: RRR   Abdomen: Normoactive bowel sounds, soft, nontender, without masses or organomegaly palpable  Extremities:  Lower extremities - No pretibial edema. No lesions.  Neuro: MS is good with appropriate affect, pt is alert and Ox3     DATA REVIEWED:  Lab Results  Component Value Date   HGBA1C 8.4 (A) 11/17/2021   HGBA1C 5.9 08/06/2021   HGBA1C 11.3 (A) 02/28/2021    Latest Reference Range & Units 11/17/21 12:12  Sodium 135 - 145 mEq/L 133 (L)  Potassium 3.5 - 5.1 mEq/L 4.4  Chloride 96 - 112 mEq/L 100  CO2 19 - 32 mEq/L 24  Glucose 70 - 99 mg/dL 336 (H)  BUN 6 - 23 mg/dL 11  Creatinine 0.40 - 1.20 mg/dL 0.74  Calcium 8.4 - 10.5 mg/dL 9.6  GFR >60.00 mL/min 91.60    Latest Reference Range & Units 11/17/21 12:12  Total CHOL/HDL Ratio  5  Cholesterol 0 - 200 mg/dL 171  HDL Cholesterol >39.00 mg/dL 35.80 (L)  Direct LDL mg/dL 117.0  MICROALB/CREAT RATIO 0.0 - 30.0 mg/g 2.2  NonHDL  135.47  Triglycerides 0.0 - 149.0 mg/dL 225.0 (H)  VLDL 0.0 - 40.0 mg/dL 45.0 (H)    Latest Reference Range & Units 11/17/21 12:12  Creatinine,U mg/dL 63.8  Microalb, Ur 0.0 - 1.9 mg/dL 1.4  MICROALB/CREAT RATIO 0.0 - 30.0 mg/g 2.2       Latest Reference Range & Units 09/04/21  11:41  Sodium 134 - 144 mmol/L 141  Potassium 3.5 - 5.2 mmol/L 4.3  Chloride 96 - 106 mmol/L 105  CO2 20 - 29 mmol/L 22  Glucose 70 - 99 mg/dL 78  BUN 6 - 24 mg/dL 11  Creatinine 0.57 - 1.00 mg/dL 0.73  Calcium 8.7 - 10.2 mg/dL 9.6  BUN/Creatinine Ratio 9 - 23  15  eGFR >59 mL/min/1.73 98    ASSESSMENT / PLAN / RECOMMENDATIONS:   1) Type 2 Diabetes Mellitus, with recurrent hypoglycemia , Without complications - Most recent A1c of 6.0%. Goal A1c <7.0%.    -Patient with recurrent hypoglycemic episodes despite reducing insulin dose, hence lower A1c -I will reduce her insulin with a goal of taking her off of it eventually -I have asked her that if her fasting BG's remain <80 mg/dL , she may reduce her Tyler Aas by another 4 units -Her Ozempic was recently increased by Pharm.D.  MEDICATIONS: Continue glipizide 5 mg twice daily Continue Ozempic 2 mg weekly Decrease Tresiba 24 units daily  EDUCATION / INSTRUCTIONS: BG monitoring instructions: Patient is instructed to check her blood sugars 1 times a day, fasting. Call [place] [facility] if: BG persistently < 70  I reviewed the Rule of 15 for the treatment of hypoglycemia in detail with the patient. Literature supplied.   2) Diabetic complications:  Eye: Does not have known diabetic retinopathy.  Neuro/ Feet: Does not have known diabetic peripheral neuropathy. Renal: Patient does not have known baseline CKD. She is  on an ACEI/ARB at present.   3) Dyslipidemia:   -LDL and TG elevated -She was started on atorvastatin in March 2023 -No side effects   Medication Continue  atorvastatin 10 mg daily        Signed electronically by: Mack Guise, MD  [facility]  [facility] [address] [place], [place] [address] Phone: [phone] FAX: [phone]   CC: Holley Bouche, [place] [address] Phone: [phone]  Fax:  [phone]    Return to Endocrinology clinic as below: Future Appointments  Date Time Provider [place]  04/23/2022  9:45 AM Leavy Cella, RPH-CPP [facility]  06/16/2022  7:50 AM Pieter Partridge, DO [facility] None

## 2022-03-19 NOTE — Patient Instructions (Addendum)
-   Glipizide 5 mg 1 tablet before Breakfast and Supper  - Continue Ozempic 2 mg weekly  - Decrease Tresiba 24 units daily     HOW TO TREAT LOW BLOOD SUGARS (Blood sugar LESS THAN 70 MG/DL) Please follow the RULE OF 15 for the treatment of hypoglycemia treatment (when your (blood sugars are less than 70 mg/dL)   STEP 1: Take 15 grams of carbohydrates when your blood sugar is low, which includes:  3-4 GLUCOSE TABS  OR 3-4 OZ OF JUICE OR REGULAR SODA OR ONE TUBE OF GLUCOSE GEL    STEP 2: RECHECK blood sugar in 15 MINUTES STEP 3: If your blood sugar is still low at the 15 minute recheck --> then, go back to STEP 1 and treat AGAIN with another 15 grams of carbohydrates.

## 2022-03-23 ENCOUNTER — Encounter: Payer: Self-pay | Admitting: Internal Medicine

## 2022-03-27 ENCOUNTER — Telehealth: Payer: Self-pay | Admitting: Pharmacist

## 2022-03-27 ENCOUNTER — Other Ambulatory Visit: Payer: Self-pay | Admitting: Family Medicine

## 2022-03-27 DIAGNOSIS — R519 Headache, unspecified: Secondary | ICD-10-CM

## 2022-03-27 NOTE — Telephone Encounter (Signed)
Patient called with report of sensor signal loss and bleeding under her sensor this AM.  She recently replaced her sensor and requested replacement.   I agreed to have her come to the office to review placement and provide replacement (sample).   She agreed.   Arrived in office shortly after 9:00AM New sensor applied to her skin.  No suggestions for change in administration.  Patient is using both skin tack AND using overlay waterproof bandages on the sensor.    At this time her data is not uploading into libreview.  Patient data appears to be doing very well by visual assessment of readings on her phone.   Follow-up PRN>

## 2022-03-31 ENCOUNTER — Other Ambulatory Visit (HOSPITAL_BASED_OUTPATIENT_CLINIC_OR_DEPARTMENT_OTHER): Payer: Self-pay | Admitting: Family Medicine

## 2022-03-31 DIAGNOSIS — Z1231 Encounter for screening mammogram for malignant neoplasm of breast: Secondary | ICD-10-CM

## 2022-04-02 ENCOUNTER — Ambulatory Visit (INDEPENDENT_AMBULATORY_CARE_PROVIDER_SITE_OTHER): Payer: 59 | Admitting: Family Medicine

## 2022-04-02 VITALS — BP 148/84 | HR 92 | Ht 67.0 in | Wt 219.2 lb

## 2022-04-02 DIAGNOSIS — Z1159 Encounter for screening for other viral diseases: Secondary | ICD-10-CM | POA: Diagnosis not present

## 2022-04-02 DIAGNOSIS — R208 Other disturbances of skin sensation: Secondary | ICD-10-CM

## 2022-04-02 DIAGNOSIS — Z114 Encounter for screening for human immunodeficiency virus [HIV]: Secondary | ICD-10-CM

## 2022-04-02 NOTE — Patient Instructions (Signed)
It was wonderful to meet you today. Thank you for allowing me to be a part of your care. Below is a short summary of what we discussed at your visit today:  All over pain Stop taking the atorvastatin.  Today we obtained some lab work to check on your electrolytes, thyroid, liver function, and muscle enzymes. If the results are normal, I will send you a letter or MyChart message. If the results are abnormal, I will give you a call.    Make an appointment to follow-up with your PCP Dr. Marcina Millard or myself in 1 to 2 weeks Bring all your medications to that appointment  Health Maintenance We like to think about ways to keep you healthy for years to come. Below are some interventions and screenings we can offer to keep you healthy: -Eye exam (return to your normal eye doctor for diabetes eye exam) -Shingles vaccines (available at your favorite pharmacy -Flu vaccines (check back in early September to see if we have this yet)  Please bring all of your medications to every appointment!  If you have any questions or concerns, please do not hesitate to contact us via phone or MyChart message.   Ezequiel Essex, MD

## 2022-04-03 ENCOUNTER — Encounter: Payer: Self-pay | Admitting: Student

## 2022-04-03 ENCOUNTER — Telehealth: Payer: Self-pay

## 2022-04-03 DIAGNOSIS — Z1159 Encounter for screening for other viral diseases: Secondary | ICD-10-CM | POA: Insufficient documentation

## 2022-04-03 DIAGNOSIS — R208 Other disturbances of skin sensation: Secondary | ICD-10-CM | POA: Insufficient documentation

## 2022-04-03 LAB — COMPREHENSIVE METABOLIC PANEL
ALT: 31 IU/L (ref 0–32)
AST: 23 IU/L (ref 0–40)
Albumin/Globulin Ratio: 1.6 (ref 1.2–2.2)
Albumin: 4.5 g/dL (ref 3.8–4.9)
Alkaline Phosphatase: 134 IU/L — ABNORMAL HIGH (ref 44–121)
BUN/Creatinine Ratio: 17 (ref 9–23)
BUN: 12 mg/dL (ref 6–24)
Bilirubin Total: <0.2 mg/dL (ref 0.0–1.2)
CO2: 23 mmol/L (ref 20–29)
Calcium: 9.7 mg/dL (ref 8.7–10.2)
Chloride: 102 mmol/L (ref 96–106)
Creatinine, Ser: 0.72 mg/dL (ref 0.57–1.00)
Globulin, Total: 2.8 g/dL (ref 1.5–4.5)
Glucose: 77 mg/dL (ref 70–99)
Potassium: 4.1 mmol/L (ref 3.5–5.2)
Sodium: 139 mmol/L (ref 134–144)
Total Protein: 7.3 g/dL (ref 6.0–8.5)
eGFR: 99 mL/min/{1.73_m2} (ref >59)

## 2022-04-03 LAB — TSH: TSH: 1.34 u[IU]/mL (ref 0.450–4.500)

## 2022-04-03 LAB — HEPATITIS C ANTIBODY: Hep C Virus Ab: NONREACTIVE

## 2022-04-03 LAB — HIV ANTIBODY (ROUTINE TESTING W REFLEX): HIV Screen 4th Generation wRfx: NONREACTIVE

## 2022-04-03 LAB — CK: Total CK: 253 U/L — ABNORMAL HIGH (ref 32–182)

## 2022-04-03 NOTE — Progress Notes (Signed)
    SUBJECTIVE:   CHIEF COMPLAINT / HPI:   Allodynia of extremities Isabel Berger is a pleasant 55 year old woman who presents with the complaint of extreme sensitivity of her arms, legs, and back.  - approx 3 week duration - started left lateral forearm, now involves right arm, left leg, and circumferential ribs at bra line - skin very painful and feels as though on fire with any light touch, including sheets or clothing  - she has been sleeping upright in a recliner without blankets to avoid touching the sensitive areas - denies bowel/bladder incontinence, saddle anesthesia, gait abnormality - denies rashes of history of shingles  - denies recent trauma such as MVA or fall - new meds: olmesartan x 1 week, atorvastatin x 3 weeks  PERTINENT  PMH / PSH: HTN, T2DM, HLD, gastroparesis  OBJECTIVE:   BP (!) 148/84   Pulse 92   Ht '5\' 7"'$  (1.702 m)   Wt 219 lb 4 oz (99.5 kg)   SpO2 98%   BMI 34.34 kg/m    PHQ-9:     04/02/2022    9:47 AM 02/19/2022   10:28 AM 02/09/2022    3:47 PM  Depression screen PHQ 2/9  Decreased Interest 3 1 0  Down, Depressed, Hopeless 0 0 0  PHQ - 2 Score 3 1 0  Altered sleeping 3 3 0  Tired, decreased energy 2 3 0  Change in appetite 3 3 0  Feeling bad or failure about yourself  0 0   Trouble concentrating 2 1 0  Moving slowly or fidgety/restless 3 0 0  Suicidal thoughts 0 0 0  PHQ-9 Score 16 11 0  Difficult doing work/chores Very difficult Not difficult at all Not difficult at all    Physical Exam General: Awake, alert, oriented, no acute distress Respiratory: Unlabored respirations, speaking in full sentences, no respiratory distress Extremities: Moving all extremities spontaneously Neuro: Cranial nerves II through X grossly intact; grip, BUE, and BLE strength intact, 5/5 and equal bilaterally; sensation intact and equal  ASSESSMENT/PLAN:   Allodynia Subacute allodynia of extremities and circumferential ribs worsening over 3 weeks. Neuro exam  reassuring. While not expected to be a side effect of atorvastatin, temporal relationship of symptoms with med initiation is suspicious. Will obtain CMP, CK, and TSH. Recommend cessation of atorvastatin and follow up with PCP in 1-2 weeks. Return and ED precautions given.     Ezequiel Essex, MD [place]

## 2022-04-03 NOTE — Assessment & Plan Note (Signed)
Subacute allodynia of extremities and circumferential ribs worsening over 3 weeks. Neuro exam reassuring. While not expected to be a side effect of atorvastatin, temporal relationship of symptoms with med initiation is suspicious. Will obtain CMP, CK, and TSH. Recommend cessation of atorvastatin and follow up with PCP in 1-2 weeks. Return and ED precautions given.

## 2022-04-03 NOTE — Telephone Encounter (Signed)
Patient calls nurse line requesting lab results from yesterday.   Will forward to ordering provider.

## 2022-04-06 ENCOUNTER — Other Ambulatory Visit: Payer: Self-pay | Admitting: Student

## 2022-04-06 DIAGNOSIS — E119 Type 2 diabetes mellitus without complications: Secondary | ICD-10-CM

## 2022-04-06 NOTE — Telephone Encounter (Signed)
Called as requested. No answer, left HIPAA safe VM explaining that CK and alk phos slight elevations are not to be unexpected if this truly is a statin induced pain.  No need to recheck urgently.  She should stop the cholesterol medication and follow-up in a week or 2.  We can recheck labs at that time or in a month to see if they have resolved.  Ezequiel Essex, MD

## 2022-04-11 ENCOUNTER — Other Ambulatory Visit: Payer: Self-pay | Admitting: Internal Medicine

## 2022-04-13 NOTE — Progress Notes (Deleted)
  SUBJECTIVE:   CHIEF COMPLAINT / HPI:   F/u allodynia from 8/10, patient encourage to stop cholesterol medicine, Alk Phos mild elev to 134, CK mildly elevated to 253, TSH normal.    PERTINENT  PMH / PSH: Anxiety, Depression, Diabetes   OBJECTIVE:  There were no vitals taken for this visit.  General: NAD, pleasant, able to participate in exam Cardiac: RRR, no murmurs auscultated. Respiratory: CTAB, normal effort, no wheezes, rales or rhonchi Abdomen: soft, non-tender, non-distended, normoactive bowel sounds Extremities: warm and well perfused, no edema or cyanosis. Skin: warm and dry, no rashes noted Neuro: alert, no obvious focal deficits, speech normal Psych: Normal affect and mood  ASSESSMENT/PLAN:  No problem-specific Assessment & Plan notes found for this encounter.   No orders of the defined types were placed in this encounter.  No orders of the defined types were placed in this encounter.  No follow-ups on file. $Remove'@SIGNNOTE'wFfDVTE$ @ {    This will disappear when note is signed, click to select method of visit    :1}

## 2022-04-14 ENCOUNTER — Ambulatory Visit: Payer: 59 | Admitting: Student

## 2022-04-23 ENCOUNTER — Ambulatory Visit (INDEPENDENT_AMBULATORY_CARE_PROVIDER_SITE_OTHER): Payer: 59 | Admitting: Pharmacist

## 2022-04-23 ENCOUNTER — Encounter: Payer: Self-pay | Admitting: Pharmacist

## 2022-04-23 DIAGNOSIS — E119 Type 2 diabetes mellitus without complications: Secondary | ICD-10-CM

## 2022-04-23 DIAGNOSIS — R519 Headache, unspecified: Secondary | ICD-10-CM | POA: Diagnosis not present

## 2022-04-23 DIAGNOSIS — E11620 Type 2 diabetes mellitus with diabetic dermatitis: Secondary | ICD-10-CM

## 2022-04-23 DIAGNOSIS — K3184 Gastroparesis: Secondary | ICD-10-CM

## 2022-04-23 DIAGNOSIS — I10 Essential (primary) hypertension: Secondary | ICD-10-CM

## 2022-04-23 DIAGNOSIS — Z794 Long term (current) use of insulin: Secondary | ICD-10-CM

## 2022-04-23 DIAGNOSIS — E1165 Type 2 diabetes mellitus with hyperglycemia: Secondary | ICD-10-CM | POA: Diagnosis not present

## 2022-04-23 MED ORDER — FAMOTIDINE 20 MG PO TABS: 20.0000 mg | ORAL_TABLET | Freq: Two times a day (BID) | ORAL | 2 refills | Status: DC

## 2022-04-23 MED ORDER — GABAPENTIN 100 MG PO CAPS: 100.0000 mg | ORAL_CAPSULE | Freq: Three times a day (TID) | ORAL | 11 refills | Status: DC

## 2022-04-23 MED ORDER — CARBAMAZEPINE ER 200 MG PO TB12: 200.0000 mg | ORAL_TABLET | Freq: Two times a day (BID) | ORAL | 0 refills | Status: DC

## 2022-04-23 MED ORDER — ROSUVASTATIN CALCIUM 10 MG PO TABS: 10.0000 mg | ORAL_TABLET | Freq: Every day | ORAL | 3 refills | Status: DC

## 2022-04-23 MED ORDER — TRESIBA FLEXTOUCH 200 UNIT/ML ~~LOC~~ SOPN: 18.0000 [IU] | PEN_INJECTOR | Freq: Every day | SUBCUTANEOUS | 3 refills | Status: DC

## 2022-04-23 NOTE — Patient Instructions (Addendum)
It was nice to see you today!  Your goal blood sugar is 80-130 before eating and less than 180 after eating.  Medication Changes: Begin: Rosuvastatin '20mg'$  daily, Hydrochlorothiazide 12.'5mg'$  daily  Adjust Insulin to 18 units daily. If you experience low blood sugars, decrease by an additional 2 units daily.   Discontinue Glipizide, atorvastatin  Monitor blood sugars at home and keep a log (glucometer or piece of paper) to bring with you to your next visit.   Keep up the good work with diet and exercise. Aim for a diet full of vegetables, fruit and lean meats (chicken, [place], fish). Try to limit salt intake by eating fresh or frozen vegetables (instead of canned), rinse canned vegetables prior to cooking and do not add any additional salt to meals.

## 2022-04-23 NOTE — Assessment & Plan Note (Signed)
Hypertension longstanding currently uncontrolled. Blood pressure goal of <130/80 mmHg. Medication adherence good. Blood pressure control is suboptimal due to adverse effects wit first-line agents. -Started HCTZ 12.'5mg'$  daily -Patient educated on purpose, proper use and potential adverse effects of HCTZ.

## 2022-04-23 NOTE — Progress Notes (Signed)
NEUROLOGY CONSULTATION NOTE  Isabel Berger MRN: [id number] DOB: Jul 29, 1966  Referring provider: Madison Hickman, MD Primary care provider: Holley Bouche, MD  Reason for consult:  trigeminal neuralgia  Assessment/Plan:   Left facial pain - trigeminal neuralgia vs trigeminal autonomic cephalgia vs migraine with autonomic features  Titrate carbamazepine ER to 476m twice daily.  Repeat BMP in one month Due to peculiar bilateral symptoms (right sided last year), will check MRI of brain and trigeminal nerves with and without contrast to evaluate for any visual cause of symptoms Follow up 4 months.   Subjective:  Isabel BARTLINGis a 56year old female with DM II and HTN who presents for left sided trigeminal neuralgia.  History supplemented by primary care notes.  About a year ago, she developed right sided facial pain She saw a dentist who diagnosed trigeminal neuralgia and was started on carbamazepine.  She was convinced that the pain was from her teeth, so she had some teeth removed and pain resolved.  In June of this year, she was cutting a tree down when a limb fell and hit her on the left side of her face.  She developed the same kind of pain as the previous year but now involving the left side.  She first notes left perioral numbness and tingling that spreads up the left side of her face followed by stabbing pain radiating up in the same distribution from the left side of her mouth up to behind and around the eye.  She denies involvement of the cranium.  There is associated unilateral blurred vision, eye lacrimation, conjunctival injection and rhinorrhea.  She is not sure about ptosis but reports that she was told she looked like she was having a stroke while she had one of these attacks.  They are spontaneous and not triggered by anything such as touch, eating, brushing teeth or wind.  She was restarted on carbamazepine which helped but then the pain steadily started to return.  It  lasts about 2-3 days and occurs about every 2 weeks.  CT head personally reviewed was unremarkable.    She reports remote history of migraines.   Current NSAIDs/analgesics:  Tylenol, diclofenac 74mmeloxicam, Lidocaine patch Current antiepileptic:  carbamazepine XR 20063mwice daily, gabapentin 100m70m0mg/200-300mg at bedtime Current antihypertensive:  metoprolol succinate, olmesartan      PAST MEDICAL HISTORY: Past Medical History:  Diagnosis Date   Allergy    pollen   Asthma    as a child   Constipation    Depression    Diabetes mellitus    Family history of adverse reaction to anesthesia    Mother- N/V   Headache    Hypertension     PAST SURGICAL HISTORY: Past Surgical History:  Procedure Laterality Date   ABDOMINAL HYSTERECTOMY     FOOT SURGERY Bilateral 2008 x 3   Dr MarcMeridee ScoreTENNIS ELBOW RELEASE/NIRSCHEL PROCEDURE Left 11/18/2021   Procedure: LEFT LATERAL EPICONDYLE DEBRIDEMENT, COMMON EXTESorrentourgeon: BenfSherilyn Cooter;  [facility];  Laterality: Left;   TOTAL ABDOMINAL HYSTERECTOMY W/ BILATERAL SALPINGOOPHORECTOMY  2004   fibroids    MEDICATIONS: Current Outpatient Medications on File Prior to Visit  Medication Sig Dispense Refill   acetaminophen (TYLENOL) 500 MG tablet Take 1,000 mg by mouth every 6 (six) hours as needed for moderate pain.     carbamazepine (TEGRETOL XR) 200 MG 12 hr tablet Take 1 tablet (200 mg total) by mouth  2 (two) times daily. 30 tablet 0   Continuous Blood Gluc Sensor (FREESTYLE LIBRE 2 SENSOR) MISC 1 Device by Does not apply route every 14 (fourteen) days. 6 each 3   diclofenac (VOLTAREN) 75 MG EC tablet Take 1 tablet (75 mg total) by mouth 2 (two) times daily. 60 tablet 1   diclofenac Sodium (VOLTAREN) 1 % GEL Apply 4 g topically 4 (four) times daily. 350 g 1   famotidine (PEPCID) 20 MG tablet Take 1 tablet (20 mg total) by mouth 2 (two) times daily. 90 tablet 2   gabapentin  (NEURONTIN) 100 MG capsule Take 1 capsule (100 mg total) by mouth 3 (three) times daily. Take 144m twice a day and 200-3089mat bedtime 120 capsule 11   glucose blood (FREESTYLE TEST STRIPS) test strip Use to check glucose once daily 100 each 0   insulin degludec (TRESIBA FLEXTOUCH) 200 UNIT/ML FlexTouch Pen Inject 18 Units into the skin daily. 9 mL 3   Insulin Pen Needle (PEN NEEDLES) 32G X 4 MM MISC 1 pen by Does not apply route daily. 100 each 3   Lancets (FREESTYLE) lancets Use to check glucose three times daily 100 each 1   Lidocaine-Adhesive Sheets (LIDOPURE PATCH) 5 % KIT Apply 1 patch topically every 12 (twelve) hours as needed. 60 kit 0   meloxicam (MOBIC) 15 MG tablet Take 1 tablet (15 mg total) by mouth daily. 30 tablet 3   metoprolol succinate (TOPROL-XL) 50 MG 24 hr tablet Take 1 tablet (50 mg total) by mouth at bedtime. Take with or immediately following a meal. 90 tablet 3   nystatin (MYCOSTATIN/NYSTOP) powder Apply topically 4 (four) times daily. (Patient taking differently: Apply 1 application  topically daily as needed (yeast infection).) 15 g 0   olmesartan (BENICAR) 40 MG tablet Take 1 tablet (40 mg total) by mouth daily. 30 tablet 11   rosuvastatin (CRESTOR) 10 MG tablet Take 1 tablet (10 mg total) by mouth daily. 90 tablet 3   Semaglutide, 2 MG/DOSE, (OZEMPIC, 2 MG/DOSE,) 8 MG/3ML SOPN Inject 2 mg into the skin once weekly. 3 mL 11   [DISCONTINUED] metFORMIN (GLUCOPHAGE) 1000 MG tablet Take 1,000 mg by mouth 2 (two) times daily.     No current facility-administered medications on file prior to visit.    ALLERGIES: Allergies  Allergen Reactions   Atorvastatin Calcium Other (See Comments)    Allodynia - pain all over skin   Amlodipine Other (See Comments)    Headaches    FAMILY HISTORY: Family History  Problem Relation Age of Onset   Diabetes Mother    Hypertension Mother    Stroke Mother    Breast cancer Maternal Aunt 69   Colon cancer Neg Hx    Colon polyps  Neg Hx    Esophageal cancer Neg Hx    Rectal cancer Neg Hx    Stomach cancer Neg Hx     Objective:  Blood pressure 135/85, pulse 85, height '5\' 7"'  (1.702 m), weight 219 lb 3.2 oz (99.4 kg), SpO2 98 %. General: No acute distress.  Patient appears well-groomed.   Head:  Normocephalic/atraumatic Eyes:  fundi examined but not visualized Neck: supple, no left sided paraspinal tenderness, full range of motion Heart: regular rate and rhythm Neurological Exam: Mental status: alert and oriented to person, place, and time, speech fluent and not dysarthric, language intact. Cranial nerves: CN I: not tested CN II: pupils equal, round and reactive to light, visual fields intact CN III, IV, VI:  full range of motion, no nystagmus, no ptosis CN V: facial sensation intact. CN VII: upper and lower face symmetric CN VIII: hearing intact CN IX, X: gag intact, uvula midline CN XI: sternocleidomastoid and trapezius muscles intact CN XII: tongue midline Bulk & Tone: normal, no fasciculations. Motor:  muscle strength 5/5 throughout Sensation:  emperature and vibratory sensation intact. Deep Tendon Reflexes:  2+ throughout,  toes downgoing.   Finger to nose testing:  Without dysmetria.   Heel to shin:  Without dysmetria.   Gait:  Normal station and stride.  Romberg negative.    Thank you for allowing me to take part in the care of this patient.  Metta Clines, DO  CC:  Madison Hickman, MD  Holley Bouche, MD

## 2022-04-23 NOTE — Progress Notes (Signed)
S:     Chief Complaint  Patient presents with   Medication Management    Diabetes   Isabel Berger is a 56 y.o. female who presents for diabetes evaluation, education, and management.  PMH is significant for T2DM, HTN, allodynia.  Patient was referred by Dr. Jeani Hawking on 04/02/22. Patient has not met Primary Care Provider, Dr. Marcina Millard yet.  At last visit, patient reported allodynia with initiation of atorvastatin. At most recent endocrinology visit she reported frequent hypoglycemic episodes, tresiba was decreased from to 34 to 24, metformin was discontinued.   Today, patient arrives in good spirits and presents without any assistance. She reports continued hypoglycmeic episodes while shopping in the morning requiring assistance form employees at [employer]. Allodynia symptoms resolved with discontinuation of atorvastatin.   Family/Social History: Insurance coverage change today form [employer] to [place], Lives at home with daughter  Current diabetes medications include: Tresiba 24 units daily, Ozempic (semaglutide) 43m weekly, glipizide 553mBID Current hypertension medications include: Olmesartan 4071maily, metoprolol 71m29mS Current hyperlipidemia medications include: n/a  Patient reports adherence to taking all medications as prescribed.   Do you feel that your medications are working for you? yes Have you been experiencing any side effects to the medications prescribed? Yes, describes feeling full with even moderately sized meals since starting Ozempic. Frequent hypoglycemic episodes.  Do you have any problems obtaining medications due to transportation or finances? no Insurance coverage: [employer]  Patient reports hypoglycemic events 4 out of 7 days of the week. Occurs in the morning usually, typically stops and sits down and has orange juice or glucose tabs.   Patient reported dietary habits: Eats 1 meal/day. Stomach feels very full when she eats a regular meal due to  Ozempic Breakfast: Tries to have orange juice or toast even if she's not hungry Lunch: Skip most of the time Dinner: Not every day, feels sick if she eats a full meal Snacks: No snacks Drinks: Pepsi, water, juice  Patient-reported exercise habits: Walk a lot for work, walks with daughter for her health  O:   Review of Systems  Gastrointestinal:  Negative for abdominal pain.    Physical Exam Constitutional:      Appearance: Normal appearance.  Neurological:     Mental Status: She is alert.  Psychiatric:        Mood and Affect: Mood normal.        Behavior: Behavior normal.    8/31 CGM Download:  % Time CGM is active: 84% Average Glucose: 84 mg/dL Glucose Management Indicator: 5.6%  Glucose Variability: 19.9% (goal <36%) Time in Goal:  - Time in range 70-180: 93%% - Time above range: 0% - Time below range: 7% Observed patterns: Low blood sugars overnight and in the morning. Very stable throughout the day, few notable spikes or dips.   Lab Results  Component Value Date   HGBA1C 6.0 (A) 03/19/2022   Vitals:   04/23/22 1016  BP: (!) 142/76  Pulse: 80  SpO2: 100%    Lipid Panel     Component Value Date/Time   CHOL 171 11/17/2021 1212   CHOL 203 (H) 06/10/2018 1038   TRIG 225.0 (H) 11/17/2021 1212   HDL 35.80 (L) 11/17/2021 1212   HDL 42 06/10/2018 1038   CHOLHDL 5 11/17/2021 1212   VLDL 45.0 (H) 11/17/2021 1212   LDLCALC 138 (H) 06/10/2018 1038   LDLDIRECT 117.0 11/17/2021 1212    Clinical Atherosclerotic Cardiovascular Disease (ASCVD): No  The 10-year  ASCVD risk score (Arnett DK, et al., 2019) is: 20.5%   Values used to calculate the score:     Age: 34 years     Sex: Female     Is Non-Hispanic African American: Yes     Diabetic: Yes     Tobacco smoker: No     Systolic Blood Pressure: 418 mmHg     Is BP treated: Yes     HDL Cholesterol: 35.8 mg/dL     Total Cholesterol: 171 mg/dL   A/P: Diabetes longstanding currently controlled but experiencing  hypoglycemic episodes. Patient is able to verbalize appropriate hypoglycemia management plan. Medication adherence appears good. Control is suboptimal due to hypoglycemia. -Decreased dose of Tresiba (insulin degludec) from 24 to 18 units daily. Patient instructed to reduce by an additional 2-4 units if hypoglycemic episodes continue.  -Continued Ozempic (semaglutide) 109m weekly  -Discontinued glipizide 5152mBID -Extensively discussed pathophysiology of diabetes, recommended lifestyle interventions, dietary effects on blood sugar control.  -Counseled on s/sx of and management of hypoglycemia.  - Patient targeting weight loss goal in the next month of more than 7 pounds.    ASCVD risk - primary prevention in patient with diabetes. ASCVD risk factors include T2DM, HTN and 10-year ASCVD risk score of 23.4%. moderate intensity statin indicated.  -Started Rosuvastatin 10 mg.  - Patient counseled to monitor for allodynia with this medication and discontinue if symptoms recur.   Hypertension longstanding currently uncontrolled. Blood pressure goal of <130/80 mmHg. Medication adherence good. Blood pressure control is suboptimal due to adverse effects wit first-line agents. -Started HCTZ 12.52m81maily -Patient educated on purpose, proper use and potential adverse effects of HCTZ.   Written patient instructions provided. Patient verbalized understanding of treatment plan.  Total time in face to face counseling 38 minutes.    Follow-up:  Pharmacist PRN. PCP clinic visit in 1 month.  Patient seen with EriMartina SinnerharmD Candidate and KenTitus DubinharmD PGY-1 Resident.

## 2022-04-23 NOTE — Assessment & Plan Note (Signed)
Diabetes longstanding currently controlled but experiencing hypoglycemic episodes. Patient is able to verbalize appropriate hypoglycemia management plan. Medication adherence appears good. Control is suboptimal due to hypoglycemia. -Decreased dose of Tresiba (insulin degludec) from 24 to 18 units daily. Patient instructed to reduce by an additional 2-4 units if hypoglycemic episodes continue.  -Continued Ozempic (semaglutide) '2mg'$  weekly  -Discontinued glipizide '5mg'$  BID -Extensively discussed pathophysiology of diabetes, recommended lifestyle interventions, dietary effects on blood sugar control.  -Counseled on s/sx of and management of hypoglycemia.

## 2022-04-23 NOTE — Progress Notes (Signed)
Reviewed: I agree with Dr. Koval's documentation and management. 

## 2022-04-24 ENCOUNTER — Encounter: Payer: Self-pay | Admitting: Neurology

## 2022-04-24 ENCOUNTER — Ambulatory Visit (INDEPENDENT_AMBULATORY_CARE_PROVIDER_SITE_OTHER): Payer: Commercial Managed Care - HMO | Admitting: Neurology

## 2022-04-24 VITALS — BP 135/85 | HR 85 | Ht 67.0 in | Wt 219.2 lb

## 2022-04-24 DIAGNOSIS — G500 Trigeminal neuralgia: Secondary | ICD-10-CM

## 2022-04-24 DIAGNOSIS — R519 Headache, unspecified: Secondary | ICD-10-CM

## 2022-04-24 MED ORDER — DIAZEPAM 5 MG PO TABS: ORAL_TABLET | ORAL | 0 refills | Status: DC

## 2022-04-24 MED ORDER — CARBAMAZEPINE ER 200 MG PO TB12: ORAL_TABLET | ORAL | 0 refills | Status: DC

## 2022-04-24 NOTE — Patient Instructions (Addendum)
May be trigeminal neuralgia vs cluster headache vs migraine  Increase carbamazepine to 1 tablet in morning and 2 tablets at bedtime for one week, then increase to 2 tablets twice daily.  Check BMP in 4 weeks. Check MRI of brain and trigeminal nerves with and without contrast.  Take diazepam as prescribed.  Must have a driver to and from MRI Follow up in 4 months

## 2022-04-27 ENCOUNTER — Encounter: Payer: Self-pay | Admitting: Student

## 2022-04-28 MED ORDER — HYDROCHLOROTHIAZIDE 12.5 MG PO TABS: 12.5000 mg | ORAL_TABLET | Freq: Every day | ORAL | 3 refills | Status: DC

## 2022-04-28 NOTE — Addendum Note (Signed)
Addended by: Leavy Cella on: 04/28/2022 11:30 AM   Modules accepted: Orders

## 2022-05-07 ENCOUNTER — Encounter: Payer: Self-pay | Admitting: Student

## 2022-05-11 ENCOUNTER — Telehealth: Payer: Self-pay

## 2022-05-11 ENCOUNTER — Other Ambulatory Visit (HOSPITAL_COMMUNITY): Payer: Self-pay

## 2022-05-11 ENCOUNTER — Telehealth: Payer: Self-pay | Admitting: Pharmacist

## 2022-05-11 NOTE — Telephone Encounter (Signed)
Patient presents to [facility] asking for [facility] sensor sample.   Appears we are in the process of getting Elenor Legato authorized through insurance.   Patient reports she took sensor off yesterday due to time.   Will forward to [facility] for sample.

## 2022-05-11 NOTE — Telephone Encounter (Signed)
Patient came into the office to receive Freestyle Libre 2 cgm due to having an out of pocket expense of $75. Pharmacist informed the patient that a prior Josem Kaufmann is needed. Are you all able to start the PA for Freestyle [place] 2?  Thank you!

## 2022-05-11 NOTE — Telephone Encounter (Signed)
Noted and agree. 

## 2022-05-11 NOTE — Telephone Encounter (Signed)
Phone call to discuss options for patient NOW with insurance.   Patient reports much improved blood sugars with her weight modestly reduced.   She was informed that even with her insurance it appears that her Mike Craze sensors will be 74.99 per month.   Following some discussion, I agreed to supply her with two sensors to assist with Cash flow.  She was appreciative.  Two sensors labeled and placed at front desk for pick-up later today.   Reviewed hypoglycemia management plan including contacting our office for additional management.

## 2022-05-12 ENCOUNTER — Other Ambulatory Visit (HOSPITAL_COMMUNITY): Payer: Self-pay

## 2022-05-14 ENCOUNTER — Ambulatory Visit (INDEPENDENT_AMBULATORY_CARE_PROVIDER_SITE_OTHER): Payer: Commercial Managed Care - HMO | Admitting: Orthopaedic Surgery

## 2022-05-14 ENCOUNTER — Encounter: Payer: Self-pay | Admitting: Orthopaedic Surgery

## 2022-05-14 DIAGNOSIS — M19022 Primary osteoarthritis, left elbow: Secondary | ICD-10-CM

## 2022-05-14 DIAGNOSIS — Z794 Long term (current) use of insulin: Secondary | ICD-10-CM

## 2022-05-14 DIAGNOSIS — E1165 Type 2 diabetes mellitus with hyperglycemia: Secondary | ICD-10-CM

## 2022-05-14 DIAGNOSIS — G5602 Carpal tunnel syndrome, left upper limb: Secondary | ICD-10-CM

## 2022-05-14 DIAGNOSIS — E1142 Type 2 diabetes mellitus with diabetic polyneuropathy: Secondary | ICD-10-CM | POA: Diagnosis not present

## 2022-05-14 DIAGNOSIS — M7712 Lateral epicondylitis, left elbow: Secondary | ICD-10-CM
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
## 2022-05-15 ENCOUNTER — Other Ambulatory Visit (HOSPITAL_COMMUNITY): Payer: Self-pay

## 2022-05-15 ENCOUNTER — Telehealth: Payer: Self-pay

## 2022-05-15 ENCOUNTER — Encounter: Payer: Self-pay | Admitting: Orthopaedic Surgery

## 2022-05-15 NOTE — Telephone Encounter (Signed)
Submitted a PA via CMM on 05/15/22 for [employer] 2.   Approval is pending.   Key# [id number]

## 2022-05-15 NOTE — Telephone Encounter (Signed)
Will start a PA for the [facility] 2 for the patient

## 2022-05-16 ENCOUNTER — Ambulatory Visit
Admission: RE | Admit: 2022-05-16 | Discharge: 2022-05-16 | Disposition: A | Discharge status: Home / Self Care | Payer: Commercial Managed Care - HMO | Source: Ambulatory Visit | Attending: Neurology | Admitting: Neurology

## 2022-05-16 DIAGNOSIS — R519 Headache, unspecified: Secondary | ICD-10-CM

## 2022-05-16 MED ORDER — GADOBENATE DIMEGLUMINE 529 MG/ML IV SOLN
20.0000 mL | Freq: Once | INTRAVENOUS | Status: AC | PRN
  Administered 2022-05-16: 20 mL via INTRAVENOUS

## 2022-05-19 ENCOUNTER — Telehealth: Payer: Self-pay

## 2022-05-19 ENCOUNTER — Encounter: Payer: Commercial Managed Care - HMO | Admitting: Physical Medicine and Rehabilitation

## 2022-05-19 NOTE — Telephone Encounter (Addendum)
Patient Advocate Encounter  Received notification from [employer] that the request for prior authorization for FreeStyle Libre 2 Sensor has been denied due to criteria necessary not met.     Denial letter is on file

## 2022-05-19 NOTE — Telephone Encounter (Signed)
Patient Advocate Encounter   Received notification from [employer] that the request for prior authorization for FreeStyle Libre 2 Sensor has been denied due to criteria necessary not met.     Denial letter is on file

## 2022-05-19 NOTE — Telephone Encounter (Signed)
PA for [facility] 2 was denied due to criteria not being met. Denial letter is attached to file

## 2022-05-22 ENCOUNTER — Encounter: Payer: Self-pay | Admitting: Student

## 2022-05-22 ENCOUNTER — Ambulatory Visit (INDEPENDENT_AMBULATORY_CARE_PROVIDER_SITE_OTHER): Payer: Commercial Managed Care - HMO | Admitting: Student

## 2022-05-22 VITALS — BP 128/72 | HR 79 | Ht 67.0 in | Wt 213.2 lb

## 2022-05-22 DIAGNOSIS — I10 Essential (primary) hypertension: Secondary | ICD-10-CM | POA: Diagnosis not present

## 2022-05-22 DIAGNOSIS — Z23 Encounter for immunization: Secondary | ICD-10-CM

## 2022-05-22 DIAGNOSIS — Z794 Long term (current) use of insulin: Secondary | ICD-10-CM

## 2022-05-22 DIAGNOSIS — E1165 Type 2 diabetes mellitus with hyperglycemia: Secondary | ICD-10-CM | POA: Diagnosis not present

## 2022-05-22 NOTE — Progress Notes (Signed)
  SUBJECTIVE:   CHIEF COMPLAINT / HPI:   DM Last A1C: 6.0 - 03/19/22 Meds: Glipizide 5 mg BID, Tresiba 18 units daily, Ozempic 2 mg wkly Fasting CBG: Having sugars in the low 50's to 70's CBG Range: CBG range 100-130's Optho Exam Still taking glipizide, encouraged to stop, patient agrees to stop now. Still having some hypoglycemia and taking orange juice for symptoms. Reports dizziness and feeling uneasy when CBG is low.   HTN Meds: Olmesartan 40 mg, HCTZ 12.5, Metop Suc 50 Compliance: Daily BP at goal  HA Has headache today, having them almost every day, taking BC 4-5 powder a day.   Health Mait: [place]: Last was 6/23 Pap: Had a full hysterectomy and ovaries removed. Colon: Next @ 01/30/27  PERTINENT  PMH / PSH: HTN, DM    OBJECTIVE:  BP 128/72   Pulse 79   Ht '5\' 7"'$  (1.702 m)   Wt 213 lb 3.2 oz (96.7 kg)   SpO2 99%   BMI 33.39 kg/m   General: NAD, pleasant, able to participate in exam Cardiac: RRR, no murmurs auscultated. Respiratory: CTAB, normal effort, no wheezes, rales or rhonchi Abdomen: soft, non-tender, non-distended, normoactive bowel sounds Extremities: warm and well perfused, no edema or cyanosis, cap refill < 2 sec Skin: warm and dry, no rashes noted Neuro: alert, no obvious focal deficits, speech normal Psych: Normal affect and mood  ASSESSMENT/PLAN:  Essential hypertension Blood pressure well controlled today.  BP Readings from Last 3 Encounters:  05/22/22 128/72  04/24/22 135/85  04/23/22 (!) 142/76  -Continue olmesartan, HCTZ, Metop Succ  T2DM (type 2 diabetes mellitus) (HCC) CBG range good, however patient suffering from hypoglycemia. Will stop glipizide, as recommended at last DM visit, to see if that helps with hypoglycemia. Patient to message me fasting CBG's in 3 weeks.  -Tyler Aas 18 units daily -Ozempic 2 mg wkly -f/u 3 months   Orders Placed This Encounter  Procedures   Flu Vaccine QUAD 10moIM (Fluarix, Fluzone & Alfiuria Quad PF)    Ambulatory referral to Ophthalmology    Referral Priority:   Routine    Referral Type:   Consultation    Referral Reason:   Specialty Services Required    Requested Specialty:   Ophthalmology    Number of Visits Requested:   1   No orders of the defined types were placed in this encounter.  No follow-ups on file. '@SIGNNOTE'$ @

## 2022-05-22 NOTE — Patient Instructions (Signed)
It was great to see you! Thank you for allowing me to participate in your care!   Our plans for today:  - Stop Glipizide - Message me in 3 weeks your fasting blood sugars, and your low blood sugars (under 100). - We will likely be decreasing your insulin dose - Follow up appointment in 3 months   Take care and seek immediate care sooner if you develop any concerns.   Dr. Holley Bouche, MD [place]

## 2022-05-26 NOTE — Assessment & Plan Note (Addendum)
Blood pressure well controlled today.  BP Readings from Last 3 Encounters:  05/22/22 128/72  04/24/22 135/85  04/23/22 (!) 142/76  -Continue olmesartan, HCTZ, Metop Succ

## 2022-05-26 NOTE — Assessment & Plan Note (Signed)
CBG range good, however patient suffering from hypoglycemia. Will stop glipizide, as recommended at last DM visit, to see if that helps with hypoglycemia. Patient to message me fasting CBG's in 3 weeks.  -Tresiba 18 units daily -Ozempic 2 mg wkly -f/u 3 months

## 2022-05-29 ENCOUNTER — Ambulatory Visit (INDEPENDENT_AMBULATORY_CARE_PROVIDER_SITE_OTHER): Payer: Commercial Managed Care - HMO | Admitting: Physical Medicine and Rehabilitation

## 2022-05-29 ENCOUNTER — Encounter: Payer: Self-pay | Admitting: Physical Medicine and Rehabilitation

## 2022-05-29 ENCOUNTER — Encounter: Payer: Self-pay | Admitting: Neurology

## 2022-05-29 DIAGNOSIS — R202 Paresthesia of skin: Secondary | ICD-10-CM

## 2022-05-29 NOTE — Progress Notes (Signed)
Whole arm goes numb  and has hand weakness at times. Started end of July. Getting worse. Left handed dominant

## 2022-06-01 NOTE — Progress Notes (Signed)
Isabel Berger - 56 y.o. female MRN [id number]  Date of birth: [birth year]  Office Visit Note: Visit Date: 05/29/2022 PCP: Holley Bouche, MD Referred by: Garald Balding, MD  Subjective: Chief Complaint  Patient presents with   Left Hand - Numbness   HPI:  Isabel Berger is a 56 y.o. female who comes in today at the request of Dr. Joni Fears for electrodiagnostic study of the Left upper extremities.  Patient is Left hand dominant.  History of lateral epicondylitis debridement by Dr. Tempie Donning.  Complains of pain numbness and tingling in a nondermatomal fashion in the whole entire arm at times on the left.  No associated neck pain.  Does have a history of diabetic neuropathy.  Does not have upper extremity electrodiagnostic studies.  Symptoms really began to get worse in July.   ROS Otherwise per HPI.  Assessment & Plan: Visit Diagnoses:    ICD-10-CM   1. Paresthesia of skin  R20.2 NCV with EMG (electromyography)      Plan: Impression: The above electrodiagnostic study is ABNORMAL and reveals evidence of a mild right median nerve entrapment at the wrist (carpal tunnel syndrome) affecting sensory components. There is no significant electrodiagnostic evidence of any other focal nerve entrapment, brachial plexopathy or cervical radiculopathy.   Recommendations: 1.  Follow-up with referring physician. 2.  Continue current management of symptoms. 3.  Continue use of resting splint at night-time and as needed during the day.  Meds & Orders: No orders of the defined types were placed in this encounter.   Orders Placed This Encounter  Procedures   NCV with EMG (electromyography)    Follow-up: Return in about 2 weeks (around 06/12/2022) for Joni Fears, MD.   Procedures: No procedures performed  EMG & NCV Findings: Evaluation of the left median (across palm) sensory nerve showed no response (Palm) and prolonged distal peak latency (4.0 ms).  All remaining nerves  (as indicated in the following tables) were within normal limits.    All examined muscles (as indicated in the following table) showed no evidence of electrical instability.    Impression: The above electrodiagnostic study is ABNORMAL and reveals evidence of a mild right median nerve entrapment at the wrist (carpal tunnel syndrome) affecting sensory components. There is no significant electrodiagnostic evidence of any other focal nerve entrapment, brachial plexopathy or cervical radiculopathy.   Recommendations: 1.  Follow-up with referring physician. 2.  Continue current management of symptoms. 3.  Continue use of resting splint at night-time and as needed during the day.  ___________________________ Wonda Olds Board Certified, American Board of Physical Medicine and Rehabilitation    Nerve Conduction Studies Anti Sensory Summary Table   Stim Site NR Peak (ms) Norm Peak (ms) P-T Amp (V) Norm P-T Amp Site1 Site2 Delta-P (ms) Dist (cm) Vel (m/s) Norm Vel (m/s)  Left Median Acr Palm Anti Sensory (2nd Digit)  30.7C  Wrist    *4.0 <3.6 19.5 >10 Wrist Palm  0.0    Palm *NR  <2.0          Left Radial Anti Sensory (Base 1st Digit)  31.3C  Wrist    2.2 <3.1 25.6  Wrist Base 1st Digit 2.2 0.0    Left Ulnar Anti Sensory (5th Digit)  31.4C  Wrist    3.4 <3.7 20.6 >15.0 Wrist 5th Digit 3.4 14.0 41 >38   Motor Summary Table   Stim Site NR Onset (ms) Norm Onset (ms) O-P Amp (mV) Norm O-P Amp Site1  Site2 Delta-0 (ms) Dist (cm) Vel (m/s) Norm Vel (m/s)  Left Median Motor (Abd Poll Brev)  31.4C  Wrist    3.8 <4.2 6.0 >5 Elbow Wrist 4.7 24.0 51 >50  Elbow    8.5  5.2         Left Ulnar Motor (Abd Dig Min)  31.6C  Wrist    3.0 <4.2 3.6 >3 B Elbow Wrist 4.0 24.0 60 >53  B Elbow    7.0  7.9  A Elbow B Elbow 1.7 11.0 65 >53  A Elbow    8.7  7.5          EMG   Side Muscle Nerve Root Ins Act Fibs Psw Amp Dur Poly Recrt Int Fraser Din Comment  Left 1stDorInt Ulnar C8-T1 Nml Nml Nml Nml Nml  0 Nml Nml   Left Abd Poll Brev Median C8-T1 Nml Nml Nml Nml Nml 0 Nml Nml   Left ExtDigCom   Nml Nml Nml Nml Nml 0 Nml Nml   Left Triceps Radial C6-7-8 Nml Nml Nml Nml Nml 0 Nml Nml   Left Deltoid Axillary C5-6 Nml Nml Nml Nml Nml 0 Nml Nml     Nerve Conduction Studies Anti Sensory Left/Right Comparison   Stim Site L Lat (ms) R Lat (ms) L-R Lat (ms) L Amp (V) R Amp (V) L-R Amp (%) Site1 Site2 L Vel (m/s) R Vel (m/s) L-R Vel (m/s)  Median Acr Palm Anti Sensory (2nd Digit)  30.7C  Wrist *4.0   19.5   Wrist Palm     Palm             Radial Anti Sensory (Base 1st Digit)  31.3C  Wrist 2.2   25.6   Wrist Base 1st Digit     Ulnar Anti Sensory (5th Digit)  31.4C  Wrist 3.4   20.6   Wrist 5th Digit 41     Motor Left/Right Comparison   Stim Site L Lat (ms) R Lat (ms) L-R Lat (ms) L Amp (mV) R Amp (mV) L-R Amp (%) Site1 Site2 L Vel (m/s) R Vel (m/s) L-R Vel (m/s)  Median Motor (Abd Poll Brev)  31.4C  Wrist 3.8   6.0   Elbow Wrist 51    Elbow 8.5   5.2         Ulnar Motor (Abd Dig Min)  31.6C  Wrist 3.0   3.6   B Elbow Wrist 60    B Elbow 7.0   7.9   A Elbow B Elbow 65    A Elbow 8.7   7.5            Waveforms:             Clinical History: No specialty comments available.     Objective:  VS:  HT:    WT:   BMI:     BP:   HR: bpm  TEMP: ( )  RESP:  Physical Exam Musculoskeletal:        General: Tenderness present. No swelling or deformity.     Comments: Inspection reveals no atrophy of the bilateral APB or FDI or hand intrinsics. There is no swelling, color changes, allodynia or dystrophic changes. There is 5 out of 5 strength in the bilateral wrist extension, finger abduction and long finger flexion. There is intact sensation to light touch in all dermatomal and peripheral nerve distributions. There is a negative Hoffmann's test bilaterally.  Skin:    General: Skin is warm and dry.  Findings: No erythema or rash.  Neurological:     General: No focal deficit  present.     Mental Status: She is alert and oriented to person, place, and time.     Motor: No weakness or abnormal muscle tone.     Coordination: Coordination normal.  Psychiatric:        Mood and Affect: Mood normal.        Behavior: Behavior normal.      Imaging: No results found.

## 2022-06-01 NOTE — Procedures (Signed)
EMG & NCV Findings: Evaluation of the left median (across palm) sensory nerve showed no response (Palm) and prolonged distal peak latency (4.0 ms).  All remaining nerves (as indicated in the following tables) were within normal limits.    All examined muscles (as indicated in the following table) showed no evidence of electrical instability.    Impression: The above electrodiagnostic study is ABNORMAL and reveals evidence of a mild right median nerve entrapment at the wrist (carpal tunnel syndrome) affecting sensory components. There is no significant electrodiagnostic evidence of any other focal nerve entrapment, brachial plexopathy or cervical radiculopathy.   Recommendations: 1.  Follow-up with referring physician. 2.  Continue current management of symptoms. 3.  Continue use of resting splint at night-time and as needed during the day.  ___________________________ Wonda Olds Board Certified, American Board of Physical Medicine and Rehabilitation    Nerve Conduction Studies Anti Sensory Summary Table   Stim Site NR Peak (ms) Norm Peak (ms) P-T Amp (V) Norm P-T Amp Site1 Site2 Delta-P (ms) Dist (cm) Vel (m/s) Norm Vel (m/s)  Left Median Acr Palm Anti Sensory (2nd Digit)  30.7C  Wrist    *4.0 <3.6 19.5 >10 Wrist Palm  0.0    Palm *NR  <2.0          Left Radial Anti Sensory (Base 1st Digit)  31.3C  Wrist    2.2 <3.1 25.6  Wrist Base 1st Digit 2.2 0.0    Left Ulnar Anti Sensory (5th Digit)  31.4C  Wrist    3.4 <3.7 20.6 >15.0 Wrist 5th Digit 3.4 14.0 41 >38   Motor Summary Table   Stim Site NR Onset (ms) Norm Onset (ms) O-P Amp (mV) Norm O-P Amp Site1 Site2 Delta-0 (ms) Dist (cm) Vel (m/s) Norm Vel (m/s)  Left Median Motor (Abd Poll Brev)  31.4C  Wrist    3.8 <4.2 6.0 >5 Elbow Wrist 4.7 24.0 51 >50  Elbow    8.5  5.2         Left Ulnar Motor (Abd Dig Min)  31.6C  Wrist    3.0 <4.2 3.6 >3 B Elbow Wrist 4.0 24.0 60 >53  B Elbow    7.0  7.9  A Elbow B Elbow 1.7 11.0  65 >53  A Elbow    8.7  7.5          EMG   Side Muscle Nerve Root Ins Act Fibs Psw Amp Dur Poly Recrt Int Fraser Din Comment  Left 1stDorInt Ulnar C8-T1 Nml Nml Nml Nml Nml 0 Nml Nml   Left Abd Poll Brev Median C8-T1 Nml Nml Nml Nml Nml 0 Nml Nml   Left ExtDigCom   Nml Nml Nml Nml Nml 0 Nml Nml   Left Triceps Radial C6-7-8 Nml Nml Nml Nml Nml 0 Nml Nml   Left Deltoid Axillary C5-6 Nml Nml Nml Nml Nml 0 Nml Nml     Nerve Conduction Studies Anti Sensory Left/Right Comparison   Stim Site L Lat (ms) R Lat (ms) L-R Lat (ms) L Amp (V) R Amp (V) L-R Amp (%) Site1 Site2 L Vel (m/s) R Vel (m/s) L-R Vel (m/s)  Median Acr Palm Anti Sensory (2nd Digit)  30.7C  Wrist *4.0   19.5   Wrist Palm     Palm             Radial Anti Sensory (Base 1st Digit)  31.3C  Wrist 2.2   25.6   Wrist Base 1st Digit  Ulnar Anti Sensory (5th Digit)  31.4C  Wrist 3.4   20.6   Wrist 5th Digit 41     Motor Left/Right Comparison   Stim Site L Lat (ms) R Lat (ms) L-R Lat (ms) L Amp (mV) R Amp (mV) L-R Amp (%) Site1 Site2 L Vel (m/s) R Vel (m/s) L-R Vel (m/s)  Median Motor (Abd Poll Brev)  31.4C  Wrist 3.8   6.0   Elbow Wrist 51    Elbow 8.5   5.2         Ulnar Motor (Abd Dig Min)  31.6C  Wrist 3.0   3.6   B Elbow Wrist 60    B Elbow 7.0   7.9   A Elbow B Elbow 65    A Elbow 8.7   7.5            Waveforms:

## 2022-06-10 ENCOUNTER — Ambulatory Visit: Payer: Commercial Managed Care - HMO | Admitting: Orthopaedic Surgery

## 2022-06-16 ENCOUNTER — Ambulatory Visit: Payer: 59 | Admitting: Neurology

## 2022-07-03 ENCOUNTER — Ambulatory Visit (INDEPENDENT_AMBULATORY_CARE_PROVIDER_SITE_OTHER): Payer: Commercial Managed Care - HMO | Admitting: Family Medicine

## 2022-07-03 ENCOUNTER — Encounter: Payer: Self-pay | Admitting: Family Medicine

## 2022-07-03 VITALS — BP 144/87 | HR 87 | Wt 213.0 lb

## 2022-07-03 DIAGNOSIS — B372 Candidiasis of skin and nail: Secondary | ICD-10-CM

## 2022-07-03 MED ORDER — KETOCONAZOLE 2 % EX CREA: 1.0000 | TOPICAL_CREAM | Freq: Every day | CUTANEOUS | 2 refills | Status: DC

## 2022-07-03 NOTE — Patient Instructions (Signed)
It was great seeing you today!  Today we discussed your rash, it seems that this is due to a fungal infection. Please apply this cream at least twice daily. Keep the area dry and clean, avoid moisture as this can cause worsening. It is important to make sure your diabetes is under control so that this does not worsen.   Please follow up with Dr. Marcina Millard for an A1c check.   Please follow up at your next scheduled appointment, if anything arises between now and then, please don't hesitate to contact our office.   Thank you for allowing [place] to be a part of your medical care!  Thank you, Dr. Larae Grooms  Also a reminder of our clinic's no-show policy. Please make sure to arrive at least 15 minutes prior to your scheduled appointment time. Please try to cancel before 24 hours if you are not able to make it. If you no-show for 2 appointments then you will be receiving a warning letter. If you no-show after 3 visits, then you may be at risk of being dismissed from our clinic. This is to ensure that everyone is able to be seen in a timely manner. Thank you, we appreciate your assistance with this!

## 2022-07-03 NOTE — Assessment & Plan Note (Signed)
-  rash seems most consistent with fungal etiology, likely candida intertrigo given location. No indication for antibiotics given fungal etiology -prescribed ketoconazole cream -instructed to keep area clean and dry, avoid irritation such as bra or other tight clothing -discussed importance of maintaining appropriate glucose level to limit further fungal infections as this can cause them, patient voiced understanding -instructed to follow up in 1-2 months for A1c recheck or sooner for follow up if symptoms worsen

## 2022-07-03 NOTE — Progress Notes (Signed)
    SUBJECTIVE:   CHIEF COMPLAINT / HPI:   Patient presents with rash under her breasts, reports constant itching. First appears in the middle of her chest between breasts and then spread to under her breasts. She is not able to wear a bra as this causes irritation. Says that it feels inflammed and is very irritated. Also with history of DM, glucose levels typically range between 80-170s with highest 178. Typically on the lower end of that range. Compliant on DM regimen. Last A1c was 6 at the end of July. Denies any other symptoms including fever or any exposures.   OBJECTIVE:   BP (!) 144/87   Pulse 87   Wt 213 lb (96.6 kg)   SpO2 100%   BMI 33.36 kg/m   General: Patient well-appearing, in no acute distress. CV: RRR, no murmurs or gallops auscultated Resp: CTAB, no wheezing, rales or rhonchi  Derm: hyperpigmented patches noted between the breasts and more prominently under both breasts in a linear fashion without papules, drainage or bleeding noted.  Psych: mood appropriate, very pleasant and smiling   Skin exam of the breast area performed in the presence of chaperone, Salvatore Marvel, CMA.     ASSESSMENT/PLAN:   Candidal intertrigo -rash seems most consistent with fungal etiology, likely candida intertrigo given location. No indication for antibiotics given fungal etiology -prescribed ketoconazole cream -instructed to keep area clean and dry, avoid irritation such as bra or other tight clothing -discussed importance of maintaining appropriate glucose level to limit further fungal infections as this can cause them, patient voiced understanding -instructed to follow up in 1-2 months for A1c recheck or sooner for follow up if symptoms worsen     Jamila Slatten Larae Grooms, [place]

## 2022-08-07 ENCOUNTER — Encounter: Payer: Self-pay | Admitting: Internal Medicine

## 2022-08-07 ENCOUNTER — Ambulatory Visit (INDEPENDENT_AMBULATORY_CARE_PROVIDER_SITE_OTHER): Payer: Commercial Managed Care - HMO | Admitting: Internal Medicine

## 2022-08-07 VITALS — Ht 67.0 in | Wt 211.0 lb

## 2022-08-07 DIAGNOSIS — E119 Type 2 diabetes mellitus without complications: Secondary | ICD-10-CM | POA: Diagnosis not present

## 2022-08-07 DIAGNOSIS — E785 Hyperlipidemia, unspecified: Secondary | ICD-10-CM | POA: Diagnosis not present

## 2022-08-07 LAB — POCT GLUCOSE (DEVICE FOR HOME USE): Glucose Fasting, POC: 104 mg/dL — AB (ref 70–99)

## 2022-08-07 LAB — POCT GLYCOSYLATED HEMOGLOBIN (HGB A1C): Hemoglobin A1C: 5.6 % (ref 4.0–5.6)

## 2022-08-07 MED ORDER — TRESIBA FLEXTOUCH 200 UNIT/ML ~~LOC~~ SOPN: 14.0000 [IU] | PEN_INJECTOR | Freq: Every day | SUBCUTANEOUS | 3 refills | Status: DC

## 2022-08-07 NOTE — Patient Instructions (Addendum)
-   Continue Ozempic 2 mg weekly  - Decrease Tresiba 14 units daily     HOW TO TREAT LOW BLOOD SUGARS (Blood sugar LESS THAN 70 MG/DL) Please follow the RULE OF 15 for the treatment of hypoglycemia treatment (when your (blood sugars are less than 70 mg/dL)   STEP 1: Take 15 grams of carbohydrates when your blood sugar is low, which includes:  3-4 GLUCOSE TABS  OR 3-4 OZ OF JUICE OR REGULAR SODA OR ONE TUBE OF GLUCOSE GEL    STEP 2: RECHECK blood sugar in 15 MINUTES STEP 3: If your blood sugar is still low at the 15 minute recheck --> then, go back to STEP 1 and treat AGAIN with another 15 grams of carbohydrates.

## 2022-08-07 NOTE — Progress Notes (Signed)
Name: [facility]  MRN/ DOB: [id number], [birth year]   Age/ Sex: 56 y.o., female    PCP: Holley Bouche, MD   Reason for Endocrinology Evaluation: Type 2 Diabetes Mellitus     Date of Initial Endocrinology Visit: 11/17/2021    PATIENT IDENTIFIER: [name] is a 56 y.o. female with a past medical history of T2DM, dyslipidemia, trigeminal neuralgia. The patient presented for initial endocrinology clinic visit on 11/17/2021 for consultative assistance with her diabetes management.    HPI: Ms. Halberg was    Diagnosed with DM in 2015 Prior Medications tried/Intolerance: Metformin-GI side effects            Hemoglobin A1c has ranged from 5.4% in 2022, peaking at > 15.0% in 2019.   On her initial visit to our clinic her A1c was 8.4%, she was on [place] which we increased   She was started on glipizide in March 2023 due to persistent hyperglycemia  Glipizide was discontinued by PCP 05/2022   SUBJECTIVE:   During the last visit (03/19/2022): A1c 6.0 %      Today (08/07/22): [name] is here for follow-up on diabetes management.  She has not been using freestyle libre  due to cost. And has not been checking glucose at home .   She continues to follow-up with Dr. Tomi Likens for trigeminal neuralgia  Denies nausea, vomiting or diarrhea  She continues to receive insulin and Ozempic through the health department  HOME DIABETES REGIMEN: Ozempic 2 mg weekly Tresiba 18 units daily Atorvastatin 10 mg daily    Statin:yes ACE-I/ARB: Yes Prior Diabetic Education: Yes    CONTINUOUS GLUCOSE MONITORING RECORD INTERPRETATION : not using       DIABETIC COMPLICATIONS: Microvascular complications:   Denies: CKD, neuropathy, retinopathy Last eye exam: Completed 11/2021  Macrovascular complications:   Denies: CAD, PVD, CVA   PAST HISTORY: Past Medical History:  Past Medical History:  Diagnosis Date   Allergy    pollen   Asthma    as a child    Constipation    Depression    Diabetes mellitus    Family history of adverse reaction to anesthesia    Mother- N/V   Headache    Hypertension    Mini stroke    10 years ago   Past Surgical History:  Past Surgical History:  Procedure Laterality Date   ABDOMINAL HYSTERECTOMY     FOOT SURGERY Bilateral 2008 x 3   Dr Meridee Score    TENNIS ELBOW RELEASE/NIRSCHEL PROCEDURE Left 11/18/2021   Procedure: LEFT LATERAL EPICONDYLE DEBRIDEMENT, COMMON Pettit;  Surgeon: Sherilyn Cooter, MD;  Location: [place];  Service: Orthopedics;  Laterality: Left;   TOTAL ABDOMINAL HYSTERECTOMY W/ BILATERAL SALPINGOOPHORECTOMY  2004   fibroids    Social History:  reports that she has never smoked. She has never used smokeless tobacco. She reports that she does not currently use alcohol. She reports that she does not use drugs. Family History:  Family History  Problem Relation Age of Onset   Seizures Mother    Diabetes Mother    Hypertension Mother    Stroke Mother    Migraines Sister    Breast cancer Maternal Aunt 69   Colon cancer Neg Hx    Colon polyps Neg Hx    Esophageal cancer Neg Hx    Rectal cancer Neg Hx    Stomach cancer Neg Hx      HOME MEDICATIONS: Allergies as of 08/07/2022  Name: [facility]  MRN/ DOB: [id number], [birth year]   Age/ Sex: 56 y.o., female    PCP: Holley Bouche, MD   Reason for Endocrinology Evaluation: Type 2 Diabetes Mellitus     Date of Initial Endocrinology Visit: 11/17/2021    PATIENT IDENTIFIER: [name] is a 56 y.o. female with a past medical history of T2DM, dyslipidemia, trigeminal neuralgia. The patient presented for initial endocrinology clinic visit on 11/17/2021 for consultative assistance with her diabetes management.    HPI: Ms. Halberg was    Diagnosed with DM in 2015 Prior Medications tried/Intolerance: Metformin-GI side effects            Hemoglobin A1c has ranged from 5.4% in 2022, peaking at > 15.0% in 2019.   On her initial visit to our clinic her A1c was 8.4%, she was on [place] which we increased   She was started on glipizide in March 2023 due to persistent hyperglycemia  Glipizide was discontinued by PCP 05/2022   SUBJECTIVE:   During the last visit (03/19/2022): A1c 6.0 %      Today (08/07/22): [name] is here for follow-up on diabetes management.  She has not been using freestyle libre  due to cost. And has not been checking glucose at home .   She continues to follow-up with Dr. Tomi Likens for trigeminal neuralgia  Denies nausea, vomiting or diarrhea  She continues to receive insulin and Ozempic through the health department  HOME DIABETES REGIMEN: Ozempic 2 mg weekly Tresiba 18 units daily Atorvastatin 10 mg daily    Statin:yes ACE-I/ARB: Yes Prior Diabetic Education: Yes    CONTINUOUS GLUCOSE MONITORING RECORD INTERPRETATION : not using       DIABETIC COMPLICATIONS: Microvascular complications:   Denies: CKD, neuropathy, retinopathy Last eye exam: Completed 11/2021  Macrovascular complications:   Denies: CAD, PVD, CVA   PAST HISTORY: Past Medical History:  Past Medical History:  Diagnosis Date   Allergy    pollen   Asthma    as a child    Constipation    Depression    Diabetes mellitus    Family history of adverse reaction to anesthesia    Mother- N/V   Headache    Hypertension    Mini stroke    10 years ago   Past Surgical History:  Past Surgical History:  Procedure Laterality Date   ABDOMINAL HYSTERECTOMY     FOOT SURGERY Bilateral 2008 x 3   Dr Meridee Score    TENNIS ELBOW RELEASE/NIRSCHEL PROCEDURE Left 11/18/2021   Procedure: LEFT LATERAL EPICONDYLE DEBRIDEMENT, COMMON Pettit;  Surgeon: Sherilyn Cooter, MD;  Location: [place];  Service: Orthopedics;  Laterality: Left;   TOTAL ABDOMINAL HYSTERECTOMY W/ BILATERAL SALPINGOOPHORECTOMY  2004   fibroids    Social History:  reports that she has never smoked. She has never used smokeless tobacco. She reports that she does not currently use alcohol. She reports that she does not use drugs. Family History:  Family History  Problem Relation Age of Onset   Seizures Mother    Diabetes Mother    Hypertension Mother    Stroke Mother    Migraines Sister    Breast cancer Maternal Aunt 69   Colon cancer Neg Hx    Colon polyps Neg Hx    Esophageal cancer Neg Hx    Rectal cancer Neg Hx    Stomach cancer Neg Hx      HOME MEDICATIONS: Allergies as of 08/07/2022  Name: [facility]  MRN/ DOB: [id number], [birth year]   Age/ Sex: 56 y.o., female    PCP: Holley Bouche, MD   Reason for Endocrinology Evaluation: Type 2 Diabetes Mellitus     Date of Initial Endocrinology Visit: 11/17/2021    PATIENT IDENTIFIER: [name] is a 56 y.o. female with a past medical history of T2DM, dyslipidemia, trigeminal neuralgia. The patient presented for initial endocrinology clinic visit on 11/17/2021 for consultative assistance with her diabetes management.    HPI: Ms. Halberg was    Diagnosed with DM in 2015 Prior Medications tried/Intolerance: Metformin-GI side effects            Hemoglobin A1c has ranged from 5.4% in 2022, peaking at > 15.0% in 2019.   On her initial visit to our clinic her A1c was 8.4%, she was on [place] which we increased   She was started on glipizide in March 2023 due to persistent hyperglycemia  Glipizide was discontinued by PCP 05/2022   SUBJECTIVE:   During the last visit (03/19/2022): A1c 6.0 %      Today (08/07/22): [name] is here for follow-up on diabetes management.  She has not been using freestyle libre  due to cost. And has not been checking glucose at home .   She continues to follow-up with Dr. Tomi Likens for trigeminal neuralgia  Denies nausea, vomiting or diarrhea  She continues to receive insulin and Ozempic through the health department  HOME DIABETES REGIMEN: Ozempic 2 mg weekly Tresiba 18 units daily Atorvastatin 10 mg daily    Statin:yes ACE-I/ARB: Yes Prior Diabetic Education: Yes    CONTINUOUS GLUCOSE MONITORING RECORD INTERPRETATION : not using       DIABETIC COMPLICATIONS: Microvascular complications:   Denies: CKD, neuropathy, retinopathy Last eye exam: Completed 11/2021  Macrovascular complications:   Denies: CAD, PVD, CVA   PAST HISTORY: Past Medical History:  Past Medical History:  Diagnosis Date   Allergy    pollen   Asthma    as a child    Constipation    Depression    Diabetes mellitus    Family history of adverse reaction to anesthesia    Mother- N/V   Headache    Hypertension    Mini stroke    10 years ago   Past Surgical History:  Past Surgical History:  Procedure Laterality Date   ABDOMINAL HYSTERECTOMY     FOOT SURGERY Bilateral 2008 x 3   Dr Meridee Score    TENNIS ELBOW RELEASE/NIRSCHEL PROCEDURE Left 11/18/2021   Procedure: LEFT LATERAL EPICONDYLE DEBRIDEMENT, COMMON Pettit;  Surgeon: Sherilyn Cooter, MD;  Location: [place];  Service: Orthopedics;  Laterality: Left;   TOTAL ABDOMINAL HYSTERECTOMY W/ BILATERAL SALPINGOOPHORECTOMY  2004   fibroids    Social History:  reports that she has never smoked. She has never used smokeless tobacco. She reports that she does not currently use alcohol. She reports that she does not use drugs. Family History:  Family History  Problem Relation Age of Onset   Seizures Mother    Diabetes Mother    Hypertension Mother    Stroke Mother    Migraines Sister    Breast cancer Maternal Aunt 69   Colon cancer Neg Hx    Colon polyps Neg Hx    Esophageal cancer Neg Hx    Rectal cancer Neg Hx    Stomach cancer Neg Hx      HOME MEDICATIONS: Allergies as of 08/07/2022  Name: [facility]  MRN/ DOB: [id number], [birth year]   Age/ Sex: 56 y.o., female    PCP: Holley Bouche, MD   Reason for Endocrinology Evaluation: Type 2 Diabetes Mellitus     Date of Initial Endocrinology Visit: 11/17/2021    PATIENT IDENTIFIER: [name] is a 56 y.o. female with a past medical history of T2DM, dyslipidemia, trigeminal neuralgia. The patient presented for initial endocrinology clinic visit on 11/17/2021 for consultative assistance with her diabetes management.    HPI: Ms. Halberg was    Diagnosed with DM in 2015 Prior Medications tried/Intolerance: Metformin-GI side effects            Hemoglobin A1c has ranged from 5.4% in 2022, peaking at > 15.0% in 2019.   On her initial visit to our clinic her A1c was 8.4%, she was on [place] which we increased   She was started on glipizide in March 2023 due to persistent hyperglycemia  Glipizide was discontinued by PCP 05/2022   SUBJECTIVE:   During the last visit (03/19/2022): A1c 6.0 %      Today (08/07/22): [name] is here for follow-up on diabetes management.  She has not been using freestyle libre  due to cost. And has not been checking glucose at home .   She continues to follow-up with Dr. Tomi Likens for trigeminal neuralgia  Denies nausea, vomiting or diarrhea  She continues to receive insulin and Ozempic through the health department  HOME DIABETES REGIMEN: Ozempic 2 mg weekly Tresiba 18 units daily Atorvastatin 10 mg daily    Statin:yes ACE-I/ARB: Yes Prior Diabetic Education: Yes    CONTINUOUS GLUCOSE MONITORING RECORD INTERPRETATION : not using       DIABETIC COMPLICATIONS: Microvascular complications:   Denies: CKD, neuropathy, retinopathy Last eye exam: Completed 11/2021  Macrovascular complications:   Denies: CAD, PVD, CVA   PAST HISTORY: Past Medical History:  Past Medical History:  Diagnosis Date   Allergy    pollen   Asthma    as a child    Constipation    Depression    Diabetes mellitus    Family history of adverse reaction to anesthesia    Mother- N/V   Headache    Hypertension    Mini stroke    10 years ago   Past Surgical History:  Past Surgical History:  Procedure Laterality Date   ABDOMINAL HYSTERECTOMY     FOOT SURGERY Bilateral 2008 x 3   Dr Meridee Score    TENNIS ELBOW RELEASE/NIRSCHEL PROCEDURE Left 11/18/2021   Procedure: LEFT LATERAL EPICONDYLE DEBRIDEMENT, COMMON Pettit;  Surgeon: Sherilyn Cooter, MD;  Location: [place];  Service: Orthopedics;  Laterality: Left;   TOTAL ABDOMINAL HYSTERECTOMY W/ BILATERAL SALPINGOOPHORECTOMY  2004   fibroids    Social History:  reports that she has never smoked. She has never used smokeless tobacco. She reports that she does not currently use alcohol. She reports that she does not use drugs. Family History:  Family History  Problem Relation Age of Onset   Seizures Mother    Diabetes Mother    Hypertension Mother    Stroke Mother    Migraines Sister    Breast cancer Maternal Aunt 69   Colon cancer Neg Hx    Colon polyps Neg Hx    Esophageal cancer Neg Hx    Rectal cancer Neg Hx    Stomach cancer Neg Hx      HOME MEDICATIONS: Allergies as of 08/07/2022

## 2022-09-01 NOTE — Progress Notes (Deleted)
  SUBJECTIVE:   CHIEF COMPLAINT / HPI:   F/u DM  DM last saw endo 08/07/22 Lab Results  Component Value Date   HGBA1C 5.6 08/07/2022  Meds: Ozempic 2 mg q wk, tresiba 14 units Foot: Urine:   PERTINENT  PMH / PSH: ***  Past Medical History:  Diagnosis Date   Allergy    pollen   Asthma    as a child   Constipation    Depression    Diabetes mellitus    Family history of adverse reaction to anesthesia    Mother- N/V   Headache    Hypertension    Mini stroke    10 years ago    OBJECTIVE:  There were no vitals taken for this visit. Physical Exam   ASSESSMENT/PLAN:  There are no diagnoses linked to this encounter. No follow-ups on file. Holley Bouche, MD 09/01/2022, 4:18 PM PGY-***, [facility] {    This will disappear when note is signed, click to select method of visit    :1}

## 2022-09-01 NOTE — Patient Instructions (Incomplete)
It was great to see you! Thank you for allowing me to participate in your care!  I recommend that you always bring your medications to each appointment as this makes it easy to ensure we are on the correct medications and helps us not miss when refills are needed.  Our plans for today:  - *** -   We are checking some labs today, I will call you if they are abnormal will send you a MyChart message or a letter if they are normal.  If you do not hear about your labs in the next 2 weeks please let us know.***  Take care and seek immediate care sooner if you develop any concerns.   Dr. Sherrol Vicars, MD [facility]  

## 2022-09-08 ENCOUNTER — Ambulatory Visit: Payer: Commercial Managed Care - HMO | Admitting: Student

## 2022-09-15 ENCOUNTER — Ambulatory Visit: Payer: Commercial Managed Care - HMO | Admitting: Student

## 2022-09-21 NOTE — Progress Notes (Deleted)
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  Repeat BMP in one month Due to peculiar bilateral symptoms (right sided last year), will check MRI of brain and trigeminal nerves with and without contrast to evaluate for any visual cause of symptoms Follow up 4 months.     Subjective:  Isabel Berger is a 57 year old female with DM II and HTN who follows up for left sided facial pain.  UPDATE: MRI of brain and bilateral trigeminal nerve with and without contrast on 05/16/2022 personally reviewed showed mild chronic small vessel ischemic changes but otherwise unremarkable.  Carbamazepine was titrated to '400mg'$  twice daily.  Gabapentin was increased to '300mg'$  TID. ***  Current NSAIDs/analgesics:  Tylenol, diclofenac '75mg'$ ,meloxicam, Lidocaine patch Current antiepileptic:  carbamazepine XR '400mg'$  twice daily, gabapentin '300mg'$  TID Current antihypertensive:  metoprolol succinate, olmesartan    HISTORY: In 2022, she developed right sided facial pain She saw a dentist who diagnosed trigeminal neuralgia and was started on carbamazepine.  She was convinced that the pain was from her teeth, so she had some teeth removed and pain resolved.  In June of this year, she was cutting a tree down when a limb fell and hit her on the left side of her face.  She developed the same kind of pain as the previous year but now involving the left side.  She first notes left perioral numbness and tingling that spreads up the left side of her face followed by stabbing pain radiating up in the same distribution from the left side of her mouth up to behind and around the eye.  She denies involvement of the cranium.  There is associated unilateral blurred vision, eye lacrimation, conjunctival injection and rhinorrhea.  She  is not sure about ptosis but reports that she was told she looked like she was having a stroke while she had one of these attacks.  They are spontaneous and not triggered by anything such as touch, eating, brushing teeth or wind.  She was restarted on carbamazepine which helped but then the pain steadily started to return.  It lasts about 2-3 days and occurs about every 2 weeks.  CT head personally reviewed was unremarkable.     She reports remote history of migraines.   PAST MEDICAL HISTORY: Past Medical History:  Diagnosis Date   Allergy    pollen   Asthma    as a child   Constipation    Depression    Diabetes mellitus    Family history of adverse reaction to anesthesia    Mother- N/V   Headache    Hypertension    Mini stroke    10 years ago    MEDICATIONS: Current Outpatient Medications on File Prior to Visit  Medication Sig Dispense Refill   acetaminophen (TYLENOL) 500 MG tablet Take 1,000 mg by mouth every 6 (six) hours as needed for moderate pain.     carbamazepine (TEGRETOL XR) 200 MG 12 hr tablet Take 1 tablet in morning and 2 tablets at bedtime for one week, then increase to 2 tablets twice daily. 120 tablet 0   Continuous Blood Gluc Sensor (FREESTYLE LIBRE 2 SENSOR) MISC 1 Device by Does not apply route every 14 (fourteen) days. (Patient not taking: Reported on 08/07/2022) 6 each 3   diazepam (VALIUM) 5 MG  tablet Take 1 tablet 30-40 minutes prior to MRI.  May repeat dose at MRI facility if needed. 2 tablet 0   diclofenac (VOLTAREN) 75 MG EC tablet Take 1 tablet (75 mg total) by mouth 2 (two) times daily. 60 tablet 1   diclofenac Sodium (VOLTAREN) 1 % GEL Apply 4 g topically 4 (four) times daily. 350 g 1   famotidine (PEPCID) 20 MG tablet Take 1 tablet (20 mg total) by mouth 2 (two) times daily. 90 tablet 2   gabapentin (NEURONTIN) 100 MG capsule Take 1 capsule (100 mg total) by mouth 3 (three) times daily. Take '100mg'$  twice a day and 200-'300mg'$  at bedtime 120 capsule 11    glucose blood (FREESTYLE TEST STRIPS) test strip Use to check glucose once daily 100 each 0   hydrochlorothiazide (HYDRODIURIL) 12.5 MG tablet Take 1 tablet (12.5 mg total) by mouth daily. 90 tablet 3   insulin degludec (TRESIBA FLEXTOUCH) 200 UNIT/ML FlexTouch Pen Inject 14 Units into the skin daily. 15 mL 3   Insulin Pen Needle (PEN NEEDLES) 32G X 4 MM MISC 1 pen by Does not apply route daily. 100 each 3   ketoconazole (NIZORAL) 2 % cream Apply 1 Application topically daily. 15 g 2   Lancets (FREESTYLE) lancets Use to check glucose three times daily 100 each 1   Lidocaine-Adhesive Sheets (LIDOPURE PATCH) 5 % KIT Apply 1 patch topically every 12 (twelve) hours as needed. 60 kit 0   meloxicam (MOBIC) 15 MG tablet Take 1 tablet (15 mg total) by mouth daily. 30 tablet 3   metoprolol succinate (TOPROL-XL) 50 MG 24 hr tablet Take 1 tablet (50 mg total) by mouth at bedtime. Take with or immediately following a meal. 90 tablet 3   nystatin (MYCOSTATIN/NYSTOP) powder Apply topically 4 (four) times daily. (Patient taking differently: Apply 1 application  topically daily as needed (yeast infection).) 15 g 0   olmesartan (BENICAR) 40 MG tablet Take 1 tablet (40 mg total) by mouth daily. 30 tablet 11   rosuvastatin (CRESTOR) 10 MG tablet Take 1 tablet (10 mg total) by mouth daily. 90 tablet 3   Semaglutide, 2 MG/DOSE, (OZEMPIC, 2 MG/DOSE,) 8 MG/3ML SOPN Inject 2 mg into the skin once weekly. 3 mL 11   [DISCONTINUED] metFORMIN (GLUCOPHAGE) 1000 MG tablet Take 1,000 mg by mouth 2 (two) times daily.     No current facility-administered medications on file prior to visit.    ALLERGIES: Allergies  Allergen Reactions   Atorvastatin Calcium Other (See Comments)    Allodynia - pain all over skin   Amlodipine Other (See Comments)    Headaches    FAMILY HISTORY: Family History  Problem Relation Age of Onset   Seizures Mother    Diabetes Mother    Hypertension Mother    Stroke Mother    Migraines Sister     Breast cancer Maternal Aunt 69   Colon cancer Neg Hx    Colon polyps Neg Hx    Esophageal cancer Neg Hx    Rectal cancer Neg Hx    Stomach cancer Neg Hx       Objective:  *** General: No acute distress.  Patient appears ***-groomed.   Head:  Normocephalic/atraumatic Eyes:  Fundi examined but not visualized Neck: supple, no paraspinal tenderness, full range of motion Heart:  Regular rate and rhythm Lungs:  Clear to auscultation bilaterally Back: No paraspinal tenderness Neurological Exam: alert and oriented to person, place, and time.  Speech fluent and not dysarthric, language  intact.  CN II-XII intact. Bulk and tone normal, muscle strength 5/5 throughout.  Sensation to light touch intact.  Deep tendon reflexes 2+ throughout, toes downgoing.  Finger to nose testing intact.  Gait normal, Romberg negative.   Metta Clines, DO  CC: ***

## 2022-09-22 ENCOUNTER — Ambulatory Visit: Payer: Commercial Managed Care - HMO | Admitting: Neurology

## 2022-10-06 ENCOUNTER — Ambulatory Visit: Payer: Commercial Managed Care - HMO | Admitting: Student

## 2022-10-23 ENCOUNTER — Ambulatory Visit (INDEPENDENT_AMBULATORY_CARE_PROVIDER_SITE_OTHER): Payer: Commercial Managed Care - HMO | Admitting: Student

## 2022-10-23 ENCOUNTER — Encounter: Payer: Self-pay | Admitting: Student

## 2022-10-23 VITALS — BP 147/90 | HR 84 | Ht 67.0 in | Wt 210.8 lb

## 2022-10-23 DIAGNOSIS — E785 Hyperlipidemia, unspecified: Secondary | ICD-10-CM | POA: Diagnosis not present

## 2022-10-23 DIAGNOSIS — I10 Essential (primary) hypertension: Secondary | ICD-10-CM | POA: Diagnosis not present

## 2022-10-23 DIAGNOSIS — E119 Type 2 diabetes mellitus without complications: Secondary | ICD-10-CM | POA: Diagnosis not present

## 2022-10-23 DIAGNOSIS — G44219 Episodic tension-type headache, not intractable: Secondary | ICD-10-CM | POA: Diagnosis not present

## 2022-10-23 DIAGNOSIS — Z794 Long term (current) use of insulin: Secondary | ICD-10-CM

## 2022-10-23 NOTE — Progress Notes (Signed)
SUBJECTIVE:   CHIEF COMPLAINT / HPI:   DM Seen Endo 08/08/23 - A1c 5.4% Meds: Ozempic 2 mg weekly, Tresiba 14 units daily. Report's large amount of weight loss, but has excessive skin now and is wanting them removed. Has not been checking sugars at home, and her Elenor Legato was too expensive. She is willing to/wanting to restart her Elenor Legato, because she has money coming in from school. Report's having fullness and eat's very little. Is not getting any full meals most days. Also wanting to see RD.   Headache: Having headache every day. Note's significant social stressors with daughter needing breast reduction and mother having mild new onset dementia. Headache located across forehead in bandlike distribution. Pain last 2-3 hours, usually happens when she wakes in the mornings. Has a hx of migraines but knows the difference and these are headaches.    HLD: Lab Results  Component Value Date   CHOL 171 11/17/2021   HDL 35.80 (L) 11/17/2021   LDLCALC 138 (H) 06/10/2018   LDLDIRECT 117.0 11/17/2021   TRIG 225.0 (H) 11/17/2021   CHOLHDL 5 11/17/2021  Crestor 10 mg daily  HTN BP Readings from Last 3 Encounters:  10/23/22 (!) 147/90  07/03/22 (!) 144/87  05/22/22 128/72  BP today 145/80 and 147/90 on recheck Meds: Olmesartan 40 mg, HCTZ 12.5, Metop Suc 73    PERTINENT  PMH / PSH: DM, obesity, HLD, HTN  Past Medical History:  Diagnosis Date   Allergy    pollen   Asthma    as a child   Constipation    Depression    Diabetes mellitus    Family history of adverse reaction to anesthesia    Mother- N/V   Headache    Hypertension    Mini stroke    10 years ago    OBJECTIVE:  BP (!) 147/90   Pulse 84   Ht '5\' 7"'$  (1.702 m)   Wt 210 lb 12.8 oz (95.6 kg)   SpO2 100%   BMI 33.02 kg/m  Physical Exam Constitutional:      Appearance: Normal appearance.  Cardiovascular:     Rate and Rhythm: Normal rate and regular rhythm.     Pulses: Normal pulses.     Heart sounds: No murmur  heard.    No friction rub. No gallop.  Pulmonary:     Effort: Pulmonary effort is normal. No respiratory distress.     Breath sounds: Normal breath sounds. No stridor. No wheezing, rhonchi or rales.  Abdominal:     General: Bowel sounds are normal. There is no distension.     Palpations: Abdomen is soft. There is no mass.     Tenderness: There is no abdominal tenderness.     Hernia: No hernia is present.  Neurological:     Mental Status: She is alert.      ASSESSMENT/PLAN:  Type 2 diabetes mellitus without complication, with long-term current use of insulin ([place]) Assessment & Plan: Patient report's she is not checking her sugars at home, 2/2 to cost of libre CGM, but now that she is in school, she believes she can afford it. Patient here for f/u on diabetes, but cannot check A1c until after the 15th. Will continue home meds and current regimen. Patient report's she's doing well, but also eating less/infrequently 2/2 ozempic. Patient may be hypoglycemic in the mornings as she reports daily headaches that resolve w/ time. Patient also notes that since starting ozempic she is eating less frequently and partial meals. Informed  patient of risk for hypoglycemia and encouraged 2 minimal balanced meals a day. Patient wanting to see RD. -Check A1c after 11/06/22 -Consider adjusting meds accordingly -Restart CGM -Ozempic 2 mg q wk -Tresiba 14 units daily - Contact information for Dr. Jenne Campus RD provided - Minimal 2 balanced meals a day (protein/carb/vegetable)  Orders: -     POCT glycosylated hemoglobin (Hb A1C); Future  Essential hypertension Assessment & Plan: BP elevated today to 145/80, patient report's compliance w/ BP meds. Will have her return for nurse visit to recheck BP, and lab visit to check BMP. -Continue Olmesartan 40 mg, HCTZ 12.5, Metop Suc 50 -BMP in future lab visit -BP check in future RN visit   Orders: -     Basic metabolic panel; Future  Hyperlipidemia, unspecified  hyperlipidemia type -     Lipid panel; Future  Episodic tension-type headache, not intractable Assessment & Plan: Patient complaining of daily headaches that last 2-3 hours before resolving, pain located in front of head, band like distrubution. She usually doesn't take medication for it, and feels that they are distinct from her migraines. Patient notes significant social stressors that she also thinks are playing a role (mother w/ dementia, in school full time, daughter needing breast reduction). She denies any photo/auditory sensitivity. She also reports that she is not eating much 2/2 to feeling full all the time while on ozempic. Patient may also have some component of hypoglycemia playing a role.  -OTC pain meds for relief prn -Eat regularly, 2 balanced meals daily     No follow-ups on file. Holley Bouche, MD 10/23/2022, 3:06 PM PGY-2, [place]

## 2022-10-23 NOTE — Assessment & Plan Note (Signed)
Patient complaining of daily headaches that last 2-3 hours before resolving, pain located in front of head, band like distrubution. She usually doesn't take medication for it, and feels that they are distinct from her migraines. Patient notes significant social stressors that she also thinks are playing a role (mother w/ dementia, in school full time, daughter needing breast reduction). She denies any photo/auditory sensitivity. She also reports that she is not eating much 2/2 to feeling full all the time while on ozempic. Patient may also have some component of hypoglycemia playing a role.  -OTC pain meds for relief prn -Eat regularly, 2 balanced meals daily

## 2022-10-23 NOTE — Patient Instructions (Addendum)
It was great to see you! Thank you for allowing me to participate in your care!  Our plans for today:   - Excess Skin  Call your insurance and see if they will cover Skin removal surgery. If so, they should recommend surgeons for you.  - Ear Wax  Use Debrox solution, you can find in pharmacies or online amazon  - Dietician  Eat at least 2 balanced meals a day, can be smaller proportions. I don't want you to develop nutritional deficiencies.   Balanced means (Protein, Vegetable, Carbohydrate) Dr. Boyce Medici is our clinic dietician. She is great! Just call and schedule an appointment.  Phone [phone]  - Diabetes  Continue Tresiba 14 units and ozempic 2 mg weekly.  Return for blood draw after 11/06/22 to check A1c.  -Cholesterol and Blood pressure  Checking kidney function and cholesterol level  Your blood pressure is slightly elevated today. We will recheck at next visit and consider adjusting BP meds.  Return to clinic after the 15th for blood draw (lab visit- you can just come in)  Take care and seek immediate care sooner if you develop any concerns.   Dr. Holley Bouche, MD [place]

## 2022-10-23 NOTE — Assessment & Plan Note (Addendum)
Patient report's she is not checking her sugars at home, 2/2 to cost of libre CGM, but now that she is in school, she believes she can afford it. Patient here for f/u on diabetes, but cannot check A1c until after the 15th. Will continue home meds and current regimen. Patient report's she's doing well, but also eating less/infrequently 2/2 ozempic. Patient may be hypoglycemic in the mornings as she reports daily headaches that resolve w/ time. Patient also notes that since starting ozempic she is eating less frequently and partial meals. Informed patient of risk for hypoglycemia and encouraged 2 minimal balanced meals a day. Patient wanting to see RD. -Check A1c after 11/06/22 -Consider adjusting meds accordingly -Restart CGM -Ozempic 2 mg q wk -Tresiba 14 units daily - Contact information for Dr. Jenne Campus RD provided - Minimal 2 balanced meals a day (protein/carb/vegetable)

## 2022-10-23 NOTE — Assessment & Plan Note (Signed)
BP elevated today to 145/80, patient report's compliance w/ BP meds. Will have her return for nurse visit to recheck BP, and lab visit to check BMP. -Continue Olmesartan 40 mg, HCTZ 12.5, Metop Suc 50 -BMP in future lab visit -BP check in future RN visit

## 2022-11-09 ENCOUNTER — Other Ambulatory Visit: Payer: Commercial Managed Care - HMO

## 2022-11-09 ENCOUNTER — Ambulatory Visit: Payer: Commercial Managed Care - HMO

## 2023-01-11 NOTE — Progress Notes (Unsigned)
  SUBJECTIVE:   CHIEF COMPLAINT / HPI:   ***  PERTINENT  PMH / PSH: HTN, T2DM w/ diabetic polyneuropathy  Patient Care Team: Bess Kinds, MD as PCP - General (Family Medicine) OBJECTIVE:  There were no vitals taken for this visit. Physical Exam   ASSESSMENT/PLAN:  There are no diagnoses linked to this encounter. No follow-ups on file. Shelby Mattocks, DO 01/11/2023, 9:48 PM PGY-***, [facility] {    This will disappear when note is signed, click to select method of visit    :1}

## 2023-01-12 ENCOUNTER — Ambulatory Visit (INDEPENDENT_AMBULATORY_CARE_PROVIDER_SITE_OTHER): Payer: Commercial Managed Care - HMO | Admitting: Student

## 2023-01-12 VITALS — BP 120/87 | HR 79 | Ht 67.0 in | Wt 210.6 lb

## 2023-01-12 DIAGNOSIS — S76811A Strain of other specified muscles, fascia and tendons at thigh level, right thigh, initial encounter: Secondary | ICD-10-CM

## 2023-01-12 DIAGNOSIS — L853 Xerosis cutis: Secondary | ICD-10-CM

## 2023-01-12 DIAGNOSIS — G5711 Meralgia paresthetica, right lower limb: Secondary | ICD-10-CM | POA: Insufficient documentation

## 2023-01-12 DIAGNOSIS — E11620 Type 2 diabetes mellitus with diabetic dermatitis: Secondary | ICD-10-CM

## 2023-01-12 DIAGNOSIS — Z794 Long term (current) use of insulin: Secondary | ICD-10-CM

## 2023-01-12 DIAGNOSIS — E1142 Type 2 diabetes mellitus with diabetic polyneuropathy: Secondary | ICD-10-CM

## 2023-01-12 NOTE — Progress Notes (Signed)
Date of Visit: 01/12/2023   SUBJECTIVE:   HPI:  Isabel Berger is a 57 year old female with a past medical history of T2DM w/ peripheral neuropathy, HLD, HTN, sciatica, and R carpal tunnel syndrome.  Abnormal sensations of right thigh: Isabel Berger presents today for right thigh increased burning and sensitivity of skin over the thigh for a few months that has become mostly constant over the past two weeks. The patient describes a sensitivity to her right lateral thigh that is mainly anterolateral, slightly rotating to the anteromedial portion of the thigh in a spiral fashion. Her sensation is somewhat worsened after lots of walking or laying on the right thigh. 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 This pain does not go down to the level of the knee and she denies changes in her chronic sciatica/back pain, fever, night sweats, weakness to the legs, pain with walking, saddle anesthesia, or incontinence. She has tried Tylenol, applied an aleve cream which slightly helped for about an hour. The patient has a history of sciatica and says that this feels different.  Out of Ozempic: She notes that over the past few months she has intentionally lost about 10 lbs on ozempic, but hasn't had it for about 2 weeks as the Health department says that they are out of stock. She inquires if we can help her obtain Ozempic through other routes of assistance.  OBJECTIVE:  BP 120/87   Pulse 79   Ht 5\' 7"  (1.702 m)   Wt 210 lb 9.6 oz (95.5 kg)   SpO2 99%   BMI 32.98 kg/m  Gen: Well appearing, normal build Heart: Normal rate and rhythm Neuro/Extremities: Right SI joint tenderness, no midline vertebral tenderness. Mildly tender to right lateral hip palpation. Sensation intact distally as compared to prior examinations, unchanged sensation overlaying the S1-S3 distribution where her subjective burning is. Upon lifting for straight leg raise (both sides) she does not  feel pain, but does note pain when laying the leg down. Strength is 5/5 to flexion/extension of hip and knees bilaterally, but elicits some pain. Figure 4 testing is painful on the right leg. Negative logroll rest.  ASSESSMENT/PLAN:  Meralgia paresthetica of right side Assessment & Plan: Right thigh burning sensation- Ddx: Meralgia paresthetica, acute on chronic sciatica, DDD, disc herniation, iliopsoas tightness. Meralgia paresthetica is the most likely given the distribution and timing of symptoms in the context of obesity and diabetes, however patient likely also has sciatica and iliopsoas tightness underlying her symptoms given SI tenderness and + figure 4 testing on the right. Do not suspect trochanteric bursitis or intra-articular hip pathology at this time. -Refer to anesthesia for meralgia paresthetica, possible nerve block -Refer to PT for assistance with sciatica, iliopsoas tightness.  Orders: -     Ambulatory referral to Physical Therapy -     Ambulatory referral to Anesthesiology  Strain of right iliopsoas muscle, initial encounter -     Ambulatory referral to Physical Therapy  Type 2 diabetes mellitus with diabetic dermatitis, with long-term current use of insulin (HCC) Assessment & Plan: The health department is out of stock of Ozempic. Provided samples today. She will follow up with health department and recheck here with Dr. Barbaraann Faster in a few weeks. Medication Samples have been provided to the patient.  Drug name: Ozempic       strength: 2 mg        qty: 8 dosages  LOT: [id number]  exp.Date:  01/21/2025  The patient has been instructed regarding the correct time, dose, and frequency of taking this medication, including desired effects and most common side effects.    Diabetic polyneuropathy associated with type 2 diabetes mellitus (HCC) -     Ambulatory referral to Podiatry  Dry skin Assessment & Plan: Ddx: Dry skin, ichthyosis vulgaris. Chronic, not seasonal in  timing, and no one in her family has this, making ichthyosis vulgaris less likely, however they will be treated the same. -Baths (lukewarm), moisturize (directly after bathing), keratinolytics (OTC salicylic acid) as directed on bottle.   FOLLOW UP: Follow up in a few weeks for checkup with Dr. Lavinia Sharps, medical student [facility]  I was personally present and performed or re-performed the history, physical exam and medical decision making activities of this service and have verified that the service and findings are accurately documented in the student's note.  Shelby Mattocks, DO                  01/12/2023, 1:53 PM

## 2023-01-12 NOTE — Assessment & Plan Note (Addendum)
Ddx: Dry skin, ichthyosis vulgaris. Chronic, not seasonal in timing, and no one in her family has this, making ichthyosis vulgaris less likely, however they will be treated the same. -Baths (lukewarm), moisturize (directly after bathing), keratinolytics (OTC salicylic acid) as directed on bottle.

## 2023-01-12 NOTE — Assessment & Plan Note (Addendum)
Right thigh burning sensation- Ddx: Meralgia paresthetica, acute on chronic sciatica, DDD, disc herniation, iliopsoas tightness. Meralgia paresthetica is the most likely given the distribution and timing of symptoms in the context of obesity and diabetes, however patient likely also has sciatica and iliopsoas tightness underlying her symptoms given SI tenderness and + figure 4 testing on the right. Do not suspect trochanteric bursitis or intra-articular hip pathology at this time. -Refer to anesthesia for meralgia paresthetica, possible nerve block -Refer to PT for assistance with sciatica, iliopsoas tightness.

## 2023-01-12 NOTE — Patient Instructions (Addendum)
It was great to see you today! Thank you for choosing [facility] for your primary care. Isabel Berger was seen for Right thigh burning, skin flaking, and Ozempic assistance.  Today we addressed:  Right thigh burning- Likely "meralgia paresthetica" or nerve-related pain. We can refer you to an anesthesia/pain medicine doctor for pain management. We can also refer you to physical therapy, but this is mainly for your chronic sciatica and iliopsoas tightness found on exam today. Skin flaking- Whether from dry skin or "ichthyosis vulgaris", we recommend baths with scrubbing, moisturizing, and purchasing salicylic acid which you can apply to the skin for a few minutes before bathing to help break up the skin. Follow the directions on any bottle you may buy at the store Ozempic- We are giving you samples of Ozempic that should last you a couple of months. Follow your prescribed dosage.  If you haven't already, sign up for My Chart to have easy access to your labs results, and communication with your primary care physician.  Call the clinic at [phone] if your symptoms worsen or you have any concerns.  You should return to our clinic in a few weeks with Dr. Barbaraann Faster for a checkup.  Please arrive 15 minutes before your appointment to ensure smooth check in process.  We appreciate your efforts in making this happen.  Thank you for allowing me to participate in your care, [name], Medical Student Shelby Mattocks, DO 01/12/2023, 9:50 AM PGY-2, [facility]

## 2023-01-12 NOTE — Assessment & Plan Note (Addendum)
The health department is out of stock of Ozempic. Provided samples today. She will follow up with health department and recheck here with Dr. Barbaraann Faster in a few weeks. Medication Samples have been provided to the patient.  Drug name: Ozempic       strength: 2 mg        qty: 8 dosages  LOT: [id number]  exp.Date: 01/21/2025  The patient has been instructed regarding the correct time, dose, and frequency of taking this medication, including desired effects and most common side effects.

## 2023-01-13 ENCOUNTER — Other Ambulatory Visit: Payer: Self-pay | Admitting: Student

## 2023-01-13 ENCOUNTER — Ambulatory Visit (INDEPENDENT_AMBULATORY_CARE_PROVIDER_SITE_OTHER): Payer: Self-pay | Admitting: Student

## 2023-01-13 DIAGNOSIS — G5711 Meralgia paresthetica, right lower limb: Secondary | ICD-10-CM

## 2023-01-13 DIAGNOSIS — S6992XA Unspecified injury of left wrist, hand and finger(s), initial encounter: Secondary | ICD-10-CM

## 2023-01-13 NOTE — Patient Instructions (Signed)
Finger Sprain, Adult A finger sprain is a tear or stretch in a ligament in a finger. Ligaments are tissues that connect bones to each other. What are the causes? Finger sprains happen when something makes the bones in the hand move in an abnormal way. They are often caused by a fall or an accident. What increases the risk? This condition is more likely to develop in people who: Participate in sports in which it is easy to fall, such as skiing. Play sports that involve catching an object, such as basketball. Have poor strength and flexibility. What are the signs or symptoms? Symptoms of this condition include: Pain or tenderness in the finger. Swelling in the finger. A bluish appearance to the finger. Bruising. Difficulty bending and flexing the finger. How is this diagnosed? This condition is diagnosed with an exam of your finger. Your health care provider may take an X-ray to see if any bones are broken or dislocated. How is this treated? Treatment for this condition depends on how severe the sprain is. It may involve: Preventing the finger from moving for a period of time. Your finger may be wrapped in a bandage (dressing) or splint, or your finger may be taped to the fingers beside it (buddy taping). Medicines for pain. Exercises to strengthen the finger. These may be recommended when the finger has healed. Surgery to reconnect the ligament to a bone. This may be done if the ligament was completely torn. Follow these instructions at home: If you have a removable splint: Wear the splint as told by your health care provider. Remove it only as told by your health care provider. Check the skin around the splint every day. Tell your health care provider about any concerns. Loosen the splint if your fingers tingle, become numb, or turn cold and blue. Keep the splint clean. If the splint is not waterproof: Do not let it get wet. Cover it with a watertight covering when you take a bath or  shower. Managing pain, stiffness, and swelling If directed, put ice on the injured area. To do this: If you have a removable splint, remove it as told by your health care provider. Put ice in a plastic bag. Place a towel between your skin and the bag. Leave the ice on for 20 minutes, 2-3 times a day. Remove the ice if your skin turns bright red. This is very important. If you cannot feel pain, heat, or cold, you have a greater risk of damage to the area. Move your fingers often to reduce stiffness and swelling. Raise (elevate) the injured area above the level of your heart while you are sitting or lying down. Medicines Take over-the-counter and prescription medicines only as told by your health care provider. Ask your health care provider if the medicine prescribed to you requires you to avoid driving or using machinery. General instructions Keep any dressings dry until your health care provider says they can be removed. If your fingers are buddy taped, replace your buddy taping as told by your health care provider. Do exercises as told by your health care provider or physical therapist. Do not wear rings on your injured finger. Keep all follow-up visits. This is important. Contact a health care provider if: Your pain is not controlled with medicine. Your bruising or swelling gets worse. Your splint is damaged. You develop a fever. Get help right away if: Your finger is numb or blue. Your finger feels colder to the touch than normal. Summary A finger sprain   is a tear or stretch in a ligament in a finger. Ligaments are tissues that connect bones to each other. Finger sprains happen when something makes the bones in the hand move in an abnormal way. They are often caused by a fall or accident. This condition is diagnosed with an exam of your finger. Your health care provider may do an X-ray to see if any bones are broken or dislocated. Treatment for this condition depends on how severe  the sprain is. Treatment may involve buddy taping or wearing a splint. Surgery to reconnect the ligament to a bone may be needed if the ligament was torn all the way. This information is not intended to replace advice given to you by your health care provider. Make sure you discuss any questions you have with your health care provider. Document Revised: 07/03/2020 Document Reviewed: 07/03/2020 Elsevier Patient Education  2023 [employer]  

## 2023-01-13 NOTE — Progress Notes (Signed)
    SUBJECTIVE:   CHIEF COMPLAINT / HPI:   57 year old left hand dominant female presenting with left middle finger injury.  Patient reports 3 to 4 days ago while walking her [place] attempted to run out after a kid and in the process the leash pulled on her middle finger.  She had some swelling and significant pain after the injury.  She has used Tylenol which have provided some pain relief and since then swelling has improved.  Does not feel like her fingers are getting stuck and she is still able to extend her finger and flex but unable to completely grip with the hand.  PERTINENT  PMH / PSH: Reviewed  OBJECTIVE:   Vitals not recorded  Physical Exam General: Alert, well appearing, NAD, Oriented x4 Cardiovascular: RRR, No Murmurs, Normal S2/S2 Respiratory: CTAB, No wheezing or Rales Left middle finger: moderate tenderness over the left PIP joint of the middle finger with mild edema.  Full extension, flexion to 45 degree but unable to completely grip with the left hand.  ASSESSMENT/PLAN:   Left Middle finger injury Patient presenting with left middle finger traction injury is suspicious of possible spraining of th left middle finger PIP joint.  Exam has moderate tenderness over the left PIP joint of the middle finger with mild edema.  Has good extension and DIP flexion of the PIP joint. -Recommend splinting of the middle finger -Encourage application of ice 20 minutes 2-3 times daily -Advise use of Voltaren gel over the affected area -Ibuprofen or Tylenol for pain control.  Jerre Simon, MD [facility]

## 2023-04-02 ENCOUNTER — Other Ambulatory Visit: Payer: Self-pay

## 2023-04-02 ENCOUNTER — Ambulatory Visit (INDEPENDENT_AMBULATORY_CARE_PROVIDER_SITE_OTHER): Payer: Self-pay | Admitting: Student

## 2023-04-02 ENCOUNTER — Ambulatory Visit
Admission: RE | Admit: 2023-04-02 | Discharge: 2023-04-02 | Disposition: A | Discharge status: Home / Self Care | Payer: Self-pay | Source: Ambulatory Visit | Attending: Family Medicine | Admitting: Family Medicine

## 2023-04-02 VITALS — BP 153/95 | HR 97 | Ht 67.0 in | Wt 217.6 lb

## 2023-04-02 DIAGNOSIS — R1032 Left lower quadrant pain: Secondary | ICD-10-CM

## 2023-04-02 DIAGNOSIS — G5711 Meralgia paresthetica, right lower limb: Secondary | ICD-10-CM

## 2023-04-02 MED ORDER — DICYCLOMINE HCL 10 MG PO CAPS
10.0000 mg | ORAL_CAPSULE | Freq: Three times a day (TID) | ORAL | 3 refills | Status: DC | PRN
Start: 2023-04-02 — End: 2023-04-13

## 2023-04-02 NOTE — Assessment & Plan Note (Signed)
Agree with Dr. Delaney Meigs diagnosis here. Really would benefit from PM&R for possible nerve block vs PT for rehab exercises. She is vocally opposed to physical therapy at this time due to fear of pain.  - Gave her the number to PM&R to call and schedule appt. Confirmed active referral.  - Will continue to engage her and encourage PT, especially since this is contributing to falls

## 2023-04-02 NOTE — Assessment & Plan Note (Signed)
Colonoscopy in 2021 was benign aside from diverticulosis. Symptoms not suggestive of diverticulitis at present. ??constipation with encopresis which could explain the distribution of her symptoms and the watery stool.  - KUB to look for heavy stool burden  - Bentyl for symptom control - Return to care if not improved in the next 1-2 weeks.

## 2023-04-02 NOTE — Progress Notes (Signed)
    SUBJECTIVE:   CHIEF COMPLAINT / HPI:   Meralgia Parasthetica of the Right Side  Seen by Dr. Royal Piedra for the same some months ago. Was referred to PM&R for a possible nerve block but never heard back from them. The pain is quite bothersome to her and she has actually had a few falls at home related to the pain where it becomes so bad that her "leg gives out." Denies wearing any tight pants/belts.   Abdominal Pain  Present x2 months, persistent, daily. Accompanied by frequent watery bowel movements. Never any blood in BM. The pain is crampy, similar to menstrual cramps, but she is s/p hysterectomy. She does tell me she has a history of diverticulosos without diverticulitis. It is occasionally accompanied by nausea. She has tried Maalox without success. No fevers.    OBJECTIVE:   BP (!) 153/95   Pulse 97   Ht 5\' 7"  (1.702 m)   Wt 217 lb 9.6 oz (98.7 kg)   SpO2 100%   BMI 34.08 kg/m   Physical Exam Vitals reviewed.  Constitutional:      General: She is not in acute distress. Cardiovascular:     Rate and Rhythm: Normal rate and regular rhythm.  Pulmonary:     Effort: Pulmonary effort is normal. No respiratory distress.  Abdominal:     Comments: Abdomen is non-tender and non-distended. She is tender in the LLQ, LUQ, and epigastric areas. There is no rebound or guarding.   Musculoskeletal:     Comments: She localizes her pain over the area of lateral femoral cutaneous nerve       ASSESSMENT/PLAN:   Meralgia paresthetica of right side Agree with Dr. Delaney Meigs diagnosis here. Really would benefit from PM&R for possible nerve block vs PT for rehab exercises. She is vocally opposed to physical therapy at this time due to fear of pain.  - Gave her the number to PM&R to call and schedule appt. Confirmed active referral.  - Will continue to engage her and encourage PT, especially since this is contributing to falls   Left lower quadrant abdominal pain Colonoscopy in 2021 was  benign aside from diverticulosis. Symptoms not suggestive of diverticulitis at present. ??constipation with encopresis which could explain the distribution of her symptoms and the watery stool.  - KUB to look for heavy stool burden  - Bentyl for symptom control - Return to care if not improved in the next 1-2 weeks.     Eliezer Mccoy, MD [facility]

## 2023-04-02 NOTE — Patient Instructions (Addendum)
I recommend that you call the physical medicine and rehab docs (the ones who would do your injection/block) at [phone] and ask for an appointment. You have an active referral that was placed 01/14/23 by Dr. Royal Piedra.    Please go to [facility] at [employer] to get your X-Ray done. They are open 7:30a-5p Monday-Friday. You do not need an appointment to get this done.    I will call you with the XR results if we need to make changes, MyChart if normal. If no better in 1-2 weeks, come back to see Dr. Barbaraann Faster.  Eliezer Mccoy, MD

## 2023-04-05 ENCOUNTER — Encounter: Payer: Self-pay | Admitting: Student

## 2023-04-06 ENCOUNTER — Other Ambulatory Visit: Payer: Self-pay

## 2023-04-06 DIAGNOSIS — E1165 Type 2 diabetes mellitus with hyperglycemia: Secondary | ICD-10-CM

## 2023-04-06 MED ORDER — OZEMPIC (2 MG/DOSE) 8 MG/3ML ~~LOC~~ SOPN: PEN_INJECTOR | SUBCUTANEOUS | 2 refills | Status: DC

## 2023-04-06 NOTE — Telephone Encounter (Signed)
Spoke with patient.   She picked up prescription. She reports she did not realize the generic was the same as Bentyl.

## 2023-04-09 ENCOUNTER — Telehealth: Payer: Self-pay

## 2023-04-09 ENCOUNTER — Other Ambulatory Visit: Payer: Self-pay | Admitting: Student

## 2023-04-09 DIAGNOSIS — R1032 Left lower quadrant pain: Secondary | ICD-10-CM

## 2023-04-09 NOTE — Telephone Encounter (Signed)
Pharmacy Patient Advocate Encounter   Received notification from CoverMyMeds that prior authorization for [facility] is required/requested.   Insurance verification completed.   The patient is insured through [facility] .   Per test claim: PA required; PA submitted to [employer] via CoverMyMeds Key/confirmation #/EOC [id number]. Status is pending

## 2023-04-12 ENCOUNTER — Ambulatory Visit: Payer: Self-pay | Admitting: Student

## 2023-04-13 ENCOUNTER — Ambulatory Visit (INDEPENDENT_AMBULATORY_CARE_PROVIDER_SITE_OTHER): Payer: BLUE CROSS/BLUE SHIELD | Admitting: Student

## 2023-04-13 VITALS — BP 148/82 | HR 98 | Ht 67.0 in | Wt 216.0 lb

## 2023-04-13 DIAGNOSIS — R1032 Left lower quadrant pain: Secondary | ICD-10-CM | POA: Diagnosis not present

## 2023-04-13 DIAGNOSIS — E11620 Type 2 diabetes mellitus with diabetic dermatitis: Secondary | ICD-10-CM

## 2023-04-13 DIAGNOSIS — Z794 Long term (current) use of insulin: Secondary | ICD-10-CM | POA: Diagnosis not present

## 2023-04-13 MED ORDER — DICYCLOMINE HCL 10 MG PO CAPS
20.0000 mg | ORAL_CAPSULE | Freq: Three times a day (TID) | ORAL | 3 refills | Status: DC | PRN
Start: 2023-04-13 — End: 2023-04-20

## 2023-04-13 MED ORDER — MELOXICAM 15 MG PO TABS
15.0000 mg | ORAL_TABLET | Freq: Every day | ORAL | 3 refills | Status: DC
Start: 2023-04-13 — End: 2023-12-14

## 2023-04-13 MED ORDER — GABAPENTIN 100 MG PO CAPS
200.0000 mg | ORAL_CAPSULE | Freq: Three times a day (TID) | ORAL | 11 refills | Status: DC
Start: 2023-04-13 — End: 2023-11-30

## 2023-04-13 MED ORDER — LIDOCAINE 5 % EX PTCH: 1.0000 | MEDICATED_PATCH | CUTANEOUS | 0 refills | Status: DC

## 2023-04-13 NOTE — Assessment & Plan Note (Signed)
Instructed patient to place glucose monitoring sensor.  Will check CMP and A1c today.

## 2023-04-13 NOTE — Progress Notes (Signed)
    SUBJECTIVE:   CHIEF COMPLAINT / HPI:   Isabel Berger is a 57 y.o. female  presenting for continued abdominal pain in her left upper and lower quadrant.  She was recently seen on 8/9 for similar presentation and obtained KUB which was negative for obstruction and severe constipation.  She reports her pain has not improved on her left side.  She describes it as constant with intermittent throbbing sharp pain.  It is not worse with eating and is not relieved by bowel movements.  She denies black or bloody stool.  She has a known history of diverticulosis as seen on colonoscopy and prior CT abdomen in 2021.  She denies fever, chills, nausea, vomiting.  She was prescribed Bentyl at the last visit 10 mg 3 times daily she has been using this and denies significant benefit.  T2DM: She has been out of Ozempic for several months and reports she has been having increased urinary frequency.  She is only taking 14 units long-acting insulin daily.  She does not check her sugars.  PERTINENT  PMH / PSH: Reviewed and updated   OBJECTIVE:   BP (!) 148/82   Pulse 98   Ht 5\' 7"  (1.702 m)   Wt 216 lb (98 kg)   BMI 33.83 kg/m   Uncomfortable-appearing, no acute distress Cardio: Regular rate, regular rhythm, no murmurs on exam. Pulm: Clear, no wheezing, no crackles. No increased work of breathing Abdominal: bowel sounds present, soft,  non-distended, point tenderness on left upper quadrant and left lower quadrant without radiation Extremities: no peripheral edema  Neuro: alert and oriented x3, speech normal in content, no facial asymmetry, strength intact and equal bilaterally in UE and LE, pupils equal and reactive to light.  Psych:  Cognition and judgment appear intact. Alert, communicative  and cooperative with normal attention span and concentration. No apparent delusions, illusions, hallucinations      04/13/2023    9:25 AM 04/02/2023    3:24 PM 01/13/2023   12:22 PM  PHQ9 SCORE ONLY  PHQ-9  Total Score 7 5 3       ASSESSMENT/PLAN:   Left lower quadrant abdominal pain Exam concerning with prior history of diverticulosis.  Reassuringly she has been afebrile without signs of infection.  However will obtain a CT abdomen with contrast to rule out acute inflammation.  Will also increase her Bentyl to 20 mg 3 times daily, gabapentin to 200 mg 3 times daily, provided lidocaine patch and Voltaren gel for symptomatic relief.  Will obtain CMP, CBC, lipid, lipase, A1c labs today  T2DM (type 2 diabetes mellitus) (HCC) Instructed patient to place glucose monitoring sensor.  Will check CMP and A1c today.     Glendale Chard, DO [facility]

## 2023-04-13 NOTE — Assessment & Plan Note (Addendum)
Exam concerning with prior history of diverticulosis.  Reassuringly she has been afebrile without signs of infection.  However will obtain a CT abdomen with contrast to rule out acute inflammation.  Will also increase her Bentyl to 20 mg 3 times daily, gabapentin to 200 mg 3 times daily, provided lidocaine patch and Voltaren gel for symptomatic relief.  Will obtain CMP, CBC, lipid, lipase, A1c labs today

## 2023-04-13 NOTE — Patient Instructions (Signed)
It was great to see you today!   I am ordering a CT scan of your abdomen to look for complications like a diverticulitis flare.   I am also getting labs today to check your electrolytes, kidney function and diabetes.  I encourage you to apply your glucose sensor to track your sugars daily.   For pain:  - Increase gabapentin to 00-300 mg three times daily  - increase bentyl to 20 mg three time daily  - continue meloxicam PRN, watch for epigastric pain, black or bloody stools. If you see this stop this medication  - try lidocaine patch  - try voltaren gel   No future appointments.  Please arrive 15 minutes before your appointment to ensure smooth check in process.    Please call the clinic at [phone] if your symptoms worsen or you have any concerns.  Thank you for allowing me to participate in your care, Dr. Glendale Chard [facility]

## 2023-04-14 LAB — COMPREHENSIVE METABOLIC PANEL
ALT: 24 IU/L (ref 0–32)
AST: 17 IU/L (ref 0–40)
Albumin: 4.3 g/dL (ref 3.8–4.9)
Alkaline Phosphatase: 138 IU/L — ABNORMAL HIGH (ref 44–121)
BUN/Creatinine Ratio: 13 (ref 9–23)
BUN: 10 mg/dL (ref 6–24)
Bilirubin Total: 0.3 mg/dL (ref 0.0–1.2)
CO2: 22 mmol/L (ref 20–29)
Calcium: 9.8 mg/dL (ref 8.7–10.2)
Chloride: 102 mmol/L (ref 96–106)
Creatinine, Ser: 0.8 mg/dL (ref 0.57–1.00)
Globulin, Total: 2.7 g/dL (ref 1.5–4.5)
Glucose: 287 mg/dL — ABNORMAL HIGH (ref 70–99)
Potassium: 4.2 mmol/L (ref 3.5–5.2)
Sodium: 137 mmol/L (ref 134–144)
Total Protein: 7 g/dL (ref 6.0–8.5)
eGFR: 86 mL/min/{1.73_m2} (ref >59)

## 2023-04-14 LAB — CBC
Hematocrit: 37.8 % (ref 34.0–46.6)
Hemoglobin: 12.4 g/dL (ref 11.1–15.9)
MCH: 29.8 pg (ref 26.6–33.0)
MCHC: 32.8 g/dL (ref 31.5–35.7)
MCV: 91 fL (ref 79–97)
Platelets: 392 10*3/uL (ref 150–450)
RBC: 4.16 x10E6/uL (ref 3.77–5.28)
RDW: 12.5 % (ref 11.7–15.4)
WBC: 7.4 10*3/uL (ref 3.4–10.8)

## 2023-04-14 LAB — HEMOGLOBIN A1C
Est. average glucose Bld gHb Est-mCnc: 160 mg/dL
Hgb A1c MFr Bld: 7.2 % — ABNORMAL HIGH (ref 4.8–5.6)

## 2023-04-14 LAB — LIPID PANEL
Chol/HDL Ratio: 4.2 ratio (ref 0.0–4.4)
Cholesterol, Total: 169 mg/dL (ref 100–199)
HDL: 40 mg/dL (ref >39)
LDL Chol Calc (NIH): 104 mg/dL — ABNORMAL HIGH (ref 0–99)
Triglycerides: 140 mg/dL (ref 0–149)
VLDL Cholesterol Cal: 25 mg/dL (ref 5–40)

## 2023-04-14 NOTE — Telephone Encounter (Signed)
Pharmacy Patient Advocate Encounter  Received notification from [facility] that Prior Authorization for [facility] has been APPROVED from 04/09/23 to 04/08/24

## 2023-04-15 ENCOUNTER — Ambulatory Visit (HOSPITAL_COMMUNITY): Payer: BLUE CROSS/BLUE SHIELD

## 2023-04-15 LAB — SPECIMEN STATUS REPORT

## 2023-04-15 LAB — LIPASE: Lipase: 100 U/L — ABNORMAL HIGH (ref 14–72)

## 2023-04-16 ENCOUNTER — Telehealth: Payer: Self-pay | Admitting: Student

## 2023-04-16 ENCOUNTER — Telehealth: Payer: Self-pay

## 2023-04-16 NOTE — Telephone Encounter (Signed)
Called patient to discuss lab results. Overall nothing revealing for subacute abdominal pain. Lipase is mildly elevated to 100 but still does not point to a clear picture of pancreatitis. Her glucose was elevated and A1c 7.2 up from 5.8 8 months ago, fitting with the clinical picture of her being out of her medication.   Discussed continuing with the CT abdomen to further investigate her pain. Patient is scheduled on Monday with [facility] due to insurance coverage.   Glendale Chard, DO [facility], PGY-2 04/16/23 9:07 AM

## 2023-04-16 NOTE — Telephone Encounter (Signed)
Pharmacy Patient Advocate Encounter   Received notification from CoverMyMeds that prior authorization for LIDOCAINE 5% PATCHES is required/requested.   Insurance verification completed.   The patient is insured through [facility] .   Per test claim: PA required; PA submitted to [employer] via CoverMyMeds Key/confirmation #/EOC [id number]. Status is pending

## 2023-04-19 NOTE — Telephone Encounter (Signed)
Pharmacy Patient Advocate Encounter  Received notification from [facility] that Prior Authorization for LIDOCAINE 5% PATCHES has been DENIED. Please advise how you'd like to proceed. Full denial letter will be uploaded to the media tab. See denial reason below.    LIDOCAINE 4% PATCHES AVAILABLE OTC  PA #/Case ID/Reference #: [id number]

## 2023-04-20 ENCOUNTER — Other Ambulatory Visit: Payer: Self-pay | Admitting: Student

## 2023-04-20 ENCOUNTER — Ambulatory Visit: Payer: BLUE CROSS/BLUE SHIELD | Admitting: Pharmacist

## 2023-04-20 DIAGNOSIS — R1032 Left lower quadrant pain: Secondary | ICD-10-CM

## 2023-04-22 ENCOUNTER — Telehealth: Payer: Self-pay

## 2023-04-22 ENCOUNTER — Ambulatory Visit (INDEPENDENT_AMBULATORY_CARE_PROVIDER_SITE_OTHER): Payer: BLUE CROSS/BLUE SHIELD | Admitting: Student

## 2023-04-22 ENCOUNTER — Encounter: Payer: Self-pay | Admitting: Student

## 2023-04-22 ENCOUNTER — Ambulatory Visit: Payer: BLUE CROSS/BLUE SHIELD | Admitting: Pharmacist

## 2023-04-22 VITALS — BP 134/76 | HR 85 | Ht 67.0 in | Wt 213.6 lb

## 2023-04-22 DIAGNOSIS — R1032 Left lower quadrant pain: Secondary | ICD-10-CM | POA: Diagnosis not present

## 2023-04-22 NOTE — Patient Instructions (Signed)
It was great to see you! Thank you for allowing me to participate in your care!  I recommend that you always bring your medications to each appointment as this makes it easy to ensure we are on the correct medications and helps [place] not miss when refills are needed.  Our plans for today:  - Abdominal pain I'm not sure what is causing this abdominal pain, so we are going to send you to the GI doctors. Someone from the clinic will reach out to you, when the appointment has been scheduled.   *IF you've not heard from our clinic in 2 weeks, call the clinic and ask about the GI doctor referral  Take care and seek immediate care sooner if you develop any concerns.   Dr. Bess Kinds, MD [facility]

## 2023-04-22 NOTE — Telephone Encounter (Signed)
Thank you for the head's up and getting them connected!  -BS

## 2023-04-22 NOTE — Telephone Encounter (Signed)
Patient calls nurse line requesting to speak with provider in regards to CT results.   She reports she was not able to get this done at any of "our" locations and had to go to [facility].   She reports the results was going to be faxed to the provider. Patient completed scan.   Will forward to ordering physician.

## 2023-04-22 NOTE — Assessment & Plan Note (Addendum)
Patient comes in for follow-up of left-sided abdominal pain.  Patient received CT abdomen pelvis that was negative, and reports pain medicines offered at last visit have not helped.  Patient notes abdominal pain has been constant for 3 months and is worse when she eats, with diarrhea happening about 15 minutes after.  Patient weight stable today at visit, vitals normal, physical exam with abdominal tenderness, on the left upper and lower quadrant, patient also appreciates some rebound tenderness, low concern for peritonitis as patient overall well-appearing, and not in acute distress.  Patient symptoms could be coming from IBD, IBS, celiac's, but appear to be GI in nature, as patient had complete hysterectomy, and is having normal urination.  Patient without systemic symptoms, thus low concern for infection.  Will recommend GI referral. - GI referral

## 2023-04-22 NOTE — Progress Notes (Signed)
  SUBJECTIVE:   CHIEF COMPLAINT / HPI:   F/u abdominal pain -Patient w/ left sided abdominal pain, seen 8/9 and w/ negative KUB and started on bentyl -Pt seen 8/20 w/ continues symptoms, CT Abd Pelv negative / increased bentyl, gabapentin, lidocaine patches  Today: Sensitive to touch and located over left abdomen, Pain stops when she applies pressure, but without pressure it feels worse. Is having diarrhea 15 min after eating. Diarrhea watery w/ some formed stool in it. This has been an issue for 3 months. She thought the pain was related to the diverticulitis, and pain is described as sharp and 10/10. Pain brought on by eating, every time, and then eases off after diarrhea. Patient has hx of full hysterectomy and is peeing like normal. Gabapentin, lidocaine patches didn't help with pain.  Patient otherwise in normal state of health and denies any systemic symptoms.  PERTINENT  PMH / PSH:   Patient Care Team: Bess Kinds, MD as PCP - General (Family Medicine) OBJECTIVE:  BP 134/76   Pulse 85   Ht 5\' 7"  (1.702 m)   Wt 213 lb 9.6 oz (96.9 kg)   SpO2 98%   BMI 33.45 kg/m  Physical Exam Constitutional:      General: She is not in acute distress.    Appearance: She is well-developed. She is not ill-appearing.  Abdominal:     Tenderness: There is abdominal tenderness in the left upper quadrant and left lower quadrant. There is rebound. There is no right CVA tenderness, left CVA tenderness or guarding.  Neurological:     Mental Status: She is alert.      ASSESSMENT/PLAN:  Left lower quadrant abdominal pain Assessment & Plan: Patient comes in for follow-up of left-sided abdominal pain.  Patient received CT abdomen pelvis that was negative, and reports pain medicines offered at last visit have not helped.  Patient notes abdominal pain has been constant for 3 months and is worse when she eats, with diarrhea happening about 15 minutes after.  Patient weight stable today at visit, vitals  normal, physical exam with abdominal tenderness, on the left upper and lower quadrant, patient also appreciates some rebound tenderness, low concern for peritonitis as patient overall well-appearing, and not in acute distress.  Patient symptoms could be coming from IBD, IBS, celiac's, but appear to be GI in nature, as patient had complete hysterectomy, and is having normal urination.  Patient without systemic symptoms, thus low concern for infection.  Will recommend GI referral. - GI referral  Orders: -     Ambulatory referral to Gastroenterology   No follow-ups on file. Bess Kinds, MD 04/22/2023, 3:27 PM PGY-3, [facility]

## 2023-04-22 NOTE — Telephone Encounter (Signed)
Patient in clinic this afternoon. States that there is still an issue at the pharmacy with getting Ozempic.   Called pharmacy. They are saying that when they try to run prescription, it stills says that PA is required.   Forwarding to [facility] for further assistance.   Veronda Prude, RN

## 2023-04-22 NOTE — Telephone Encounter (Signed)
Reviewed CT AP with patient. Unfortunately there is nothing on scan that would correlate to her left sided abdominal pain. Pain has not gotten any better in the last week and she reports having nausea and vomiting.   Scheduled patient to see PCP this afternoon for follow up.   Glendale Chard, DO [facility], PGY-2 04/22/23 11:45 AM

## 2023-04-23 ENCOUNTER — Other Ambulatory Visit (HOSPITAL_COMMUNITY): Payer: Self-pay

## 2023-04-27 ENCOUNTER — Encounter: Payer: Self-pay | Admitting: Pharmacist

## 2023-04-27 ENCOUNTER — Ambulatory Visit (INDEPENDENT_AMBULATORY_CARE_PROVIDER_SITE_OTHER): Payer: BLUE CROSS/BLUE SHIELD | Admitting: Pharmacist

## 2023-04-27 VITALS — BP 119/89 | HR 93 | Ht 67.0 in | Wt 208.4 lb

## 2023-04-27 DIAGNOSIS — Z794 Long term (current) use of insulin: Secondary | ICD-10-CM

## 2023-04-27 DIAGNOSIS — E11620 Type 2 diabetes mellitus with diabetic dermatitis: Secondary | ICD-10-CM

## 2023-04-27 NOTE — Assessment & Plan Note (Signed)
Diabetes longstanding and currently in process of obtaining access to CGM in addition to her medications. Patient is able to verbalize appropriate hypoglycemia management plan. Medication adherence appears improved with recent access to Ozempic (semaglutide) 2mg . Recent GI work-up for abdominal pain is in process. Discussed management including mildly elevated Lipase with Dr. Jennette Kettle and revisit with PCP, Dr. Barbaraann Faster was scheduled.  -Continued basal insulin Tresiba (insulin degludec) at 14 units daily.  -Continued GLP-1 Ozempic (semaglutide) however educated patient on how to administer lower dose of Ozempic ~ 1 mg for the upcoming two weeks in attempt to minimize GI side effects during period of abdominal pain.    -Continued metformin.  -Patient educated on purpose, proper use, and potential adverse effects.  -Extensively discussed pathophysiology of diabetes, recommended lifestyle interventions, dietary effects on blood sugar control.  -Counseled on s/sx of and management of hypoglycemia.

## 2023-04-27 NOTE — Progress Notes (Signed)
S:     Chief Complaint  Patient presents with   Medication Management    Diabetes    57 y.o. female who presents for diabetes evaluation, education, and management. Patient arrives in good spirits and presents without any assistance..  Patient was referred and last seen by Primary Care Provider, Dr. Barbaraann Faster, on 04/22/2023.    PMH is significant for recent abdominal pain which persists at her arrival to the visit.  She has history of HTN, Hyperlipidemia and Neuropathy.  Patient reports Diabetes was diagnosed in 2011.   Current diabetes medications include: Tresiba (insulin degludec) 14 units daily, metformin and has recently restarted Ozempic (semaglutide) recently restarted 2mg  weekly.  Current hypertension medications include: Olmesatan 40mg , hydrochlorothiazide 12.5mg  and metoprolol XL 50mg  daily Current hyperlipidemia medications include: Rosuvastatin 10mg  daily  Patient reports adherence to taking all medications as prescribed recently but has had difficulty with obtaining consistent supply of CGM supplies and Ozempic (semaglutide).   Do you feel that your medications are working for you? yes Have you been experiencing any side effects to the medications prescribed? Possibly GI/Abdominal pain Do you have any problems obtaining medications due to transportation or finances? Yes - finances are challenging at times. Insurance coverage: BCBS  Patient denies hypoglycemic events.  Reported home fasting blood sugars monitoring is limited.  Plans to restart CGM in the next 1-2 days.     O:   Review of Systems  Gastrointestinal:  Positive for abdominal pain, nausea and vomiting.  All other systems reviewed and are negative.   Physical Exam   Lab Results  Component Value Date   HGBA1C 7.2 (H) 04/13/2023   Vitals:   04/27/23 1557  BP: 119/89  Pulse: 93  SpO2: 100%    Lipid Panel     Component Value Date/Time   CHOL 169 04/13/2023 1220   TRIG 140 04/13/2023 1220    HDL 40 04/13/2023 1220   CHOLHDL 4.2 04/13/2023 1220   CHOLHDL 5 11/17/2021 1212   VLDL 45.0 (H) 11/17/2021 1212   LDLCALC 104 (H) 04/13/2023 1220   LDLDIRECT 117.0 11/17/2021 1212    Clinical Atherosclerotic Cardiovascular Disease (ASCVD): No  The 10-year ASCVD risk score (Arnett DK, et al., 2019) is: 10.9%   Values used to calculate the score:     Age: 58 years     Sex: Female     Is Non-Hispanic African American: Yes     Diabetic: Yes     Tobacco smoker: No     Systolic Blood Pressure: 119 mmHg     Is BP treated: Yes     HDL Cholesterol: 40 mg/dL     Total Cholesterol: 169 mg/dL   Patient is participating in a Managed Medicaid Plan:    A/P: Diabetes longstanding and currently in process of obtaining access to CGM in addition to her medications. Patient is able to verbalize appropriate hypoglycemia management plan. Medication adherence appears improved with recent access to Ozempic (semaglutide) 2mg . Recent GI work-up for abdominal pain is in process. Discussed management including mildly elevated Lipase with Dr. Jennette Kettle and revisit with PCP, Dr. Barbaraann Faster was scheduled.  -Continued basal insulin Tresiba (insulin degludec) at 14 units daily.  -Continued GLP-1 Ozempic (semaglutide) however educated patient on how to administer lower dose of Ozempic ~ 1 mg for the upcoming two weeks in attempt to minimize GI side effects during period of abdominal pain.    -Continued metformin.  -Patient educated on purpose, proper use, and potential adverse effects.  -  Extensively discussed pathophysiology of diabetes, recommended lifestyle interventions, dietary effects on blood sugar control.  -Counseled on s/sx of and management of hypoglycemia.    Written patient instructions provided. Patient verbalized understanding of treatment plan.  Total time in face to face counseling 24 minutes.    Follow-up:  Pharmacist 3 weeks PCP clinic visit in 1 week Patient seen with Alesia Banda, PharmD  Candidate.

## 2023-04-27 NOTE — Patient Instructions (Addendum)
It was nice to see you today!  I hope you abdominal pain improves with time   Medication Changes: Adjust your dose of Ozempic (semaglutide) from 2mg  to 1mg  per week for the next two weeks.  Do this my taking 1/2 the clicks of your current Ozempic pen.  THEN increase back to 2mg  weekly (if you abdominal pain resolves/improves).    Continue all other medication the same.  Restart your CGM - place new sensor tomorrow.   Keep up the good work with diet and exercise. Aim for a diet full of vegetables, fruit and lean meats (chicken, [place], fish). Try to limit salt intake by eating fresh or frozen vegetables (instead of canned), rinse canned vegetables prior to cooking and do not add any additional salt to meals.

## 2023-04-28 ENCOUNTER — Other Ambulatory Visit: Payer: Self-pay

## 2023-04-28 DIAGNOSIS — E1165 Type 2 diabetes mellitus with hyperglycemia: Secondary | ICD-10-CM

## 2023-04-28 MED ORDER — OZEMPIC (2 MG/DOSE) 8 MG/3ML ~~LOC~~ SOPN
PEN_INJECTOR | SUBCUTANEOUS | 2 refills | Status: DC
Start: 2023-04-28 — End: 2023-06-24

## 2023-04-28 NOTE — Progress Notes (Signed)
Reviewed and agree with Dr Koval's plan.   

## 2023-04-29 ENCOUNTER — Other Ambulatory Visit: Payer: Self-pay | Admitting: *Deleted

## 2023-04-29 DIAGNOSIS — E1165 Type 2 diabetes mellitus with hyperglycemia: Secondary | ICD-10-CM

## 2023-04-29 DIAGNOSIS — K3184 Gastroparesis: Secondary | ICD-10-CM

## 2023-04-29 DIAGNOSIS — E119 Type 2 diabetes mellitus without complications: Secondary | ICD-10-CM

## 2023-04-29 MED ORDER — FAMOTIDINE 20 MG PO TABS: 20.0000 mg | ORAL_TABLET | Freq: Two times a day (BID) | ORAL | 2 refills | Status: DC

## 2023-04-29 MED ORDER — FREESTYLE LIBRE 2 SENSOR MISC
1.0000 | 3 refills | Status: DC
Start: 2023-04-29 — End: 2023-06-24

## 2023-04-29 MED ORDER — TRESIBA FLEXTOUCH 200 UNIT/ML ~~LOC~~ SOPN
14.0000 [IU] | PEN_INJECTOR | Freq: Every day | SUBCUTANEOUS | 2 refills | Status: DC
Start: 2023-04-29 — End: 2023-05-18

## 2023-05-04 ENCOUNTER — Telehealth: Payer: Self-pay

## 2023-05-04 ENCOUNTER — Encounter: Payer: Self-pay | Admitting: Student

## 2023-05-04 ENCOUNTER — Ambulatory Visit (INDEPENDENT_AMBULATORY_CARE_PROVIDER_SITE_OTHER): Payer: BLUE CROSS/BLUE SHIELD | Admitting: Student

## 2023-05-04 VITALS — BP 112/80 | HR 75 | Wt 209.0 lb

## 2023-05-04 DIAGNOSIS — G8929 Other chronic pain: Secondary | ICD-10-CM | POA: Insufficient documentation

## 2023-05-04 DIAGNOSIS — R1032 Left lower quadrant pain: Secondary | ICD-10-CM

## 2023-05-04 DIAGNOSIS — Z23 Encounter for immunization: Secondary | ICD-10-CM | POA: Diagnosis not present

## 2023-05-04 DIAGNOSIS — M5442 Lumbago with sciatica, left side: Secondary | ICD-10-CM

## 2023-05-04 DIAGNOSIS — M5441 Lumbago with sciatica, right side: Secondary | ICD-10-CM | POA: Diagnosis not present

## 2023-05-04 NOTE — Assessment & Plan Note (Signed)
Patient note's low back pain that's been an issue for years, and has more recently been flared. Patient having difficult time walking/getting around, and notes some weakness/falls from her legs giving out. Patient had positive SLR bilaterally with TTP bilaterally. Patient had Lumbar MRI 3 years ago that showed OA and bulging disc at L3 and L4. Given patient's back pain and exam, most concerning for continued pain from bulging disc. Will place referral to Neurosurgery for evaluation. Will see if neuro can help with back pain, otherwise may send patient to [facility]. Low concern for cauda equina or spinal abscess as patient having normal bathroom habits, and has no systemic symptoms. Will also recommend rollator to help with walking/falls -Amb Ref Neuro -Consider PMNR -DME rollator

## 2023-05-04 NOTE — Progress Notes (Signed)
  SUBJECTIVE:   CHIEF COMPLAINT / HPI:   F/u Abdominal Pain  -Pt seen 8/20 and 8/29 for abdominal pain that's been constant for 3 months -Ct Abd Pelv neg, have tried gabapentin/bentyl/lido patch w/o relief -Elevated Alk Phos and Lipase noted in 8/20 labs Patient given referral information to call GI.  Back pain: Is worse now, but has been an issue for years. Is having a hard time with activities/walking. Has been using her mother's rolling walker for assistance and it helps. Nothing is helping with the pain, she takes gabapentin, lidocaine patches, and meloxicam. Is having to stay in the bed 2/2 to pain. Laying down also makes back pain worse. Patient note's she's had a couple falls at home, and that sometimes she has some weakness in her legs/they give out. Patient report normal urinary and bowel habits. Patient otherwise in normal state of health, and denies any systemic symptoms.    PERTINENT  PMH / PSH:    OBJECTIVE:  BP 112/80   Pulse 75   Wt 209 lb (94.8 kg)   SpO2 100%   BMI 32.73 kg/m  Physical Exam Musculoskeletal:     Lumbar back: Tenderness and bony tenderness present. No swelling, deformity, signs of trauma, lacerations or spasms. Normal range of motion. Positive right straight leg raise test and positive left straight leg raise test.      ASSESSMENT/PLAN:  Chronic bilateral low back pain with bilateral sciatica Assessment & Plan: Patient note's low back pain that's been an issue for years, and has more recently been flared. Patient having difficult time walking/getting around, and notes some weakness/falls from her legs giving out. Patient had positive SLR bilaterally with TTP bilaterally. Patient had Lumbar MRI 3 years ago that showed OA and bulging disc at L3 and L4. Given patient's back pain and exam, most concerning for continued pain from bulging disc. Will place referral to Neurosurgery for evaluation. Will see if neuro can help with back pain, otherwise may send  patient to [facility]. Low concern for cauda equina or spinal abscess as patient having normal bathroom habits, and has no systemic symptoms. Will also recommend rollator to help with walking/falls -Amb Ref Neuro -Consider PMNR -DME rollator  Orders: -     Ambulatory referral to Neurosurgery -     For home use only DME 4 wheeled rolling walker with seat  Encounter for immunization -     Flu vaccine trivalent PF, 6mos and older(Flulaval,Afluria,Fluarix,Fluzone)  Left lower quadrant abdominal pain Assessment & Plan: Patient continues to have pain. She is following up on the referral, that was made after last visit. Information given to patient, she will call to f/u referral. -Patient to call [facility], information given    No follow-ups on file. Bess Kinds, MD 05/04/2023, 11:03 AM PGY-3, [facility]

## 2023-05-04 NOTE — Patient Instructions (Addendum)
It was great to see you! Thank you for allowing me to participate in your care!  Our plans for today:  - Back Pain Referral to Neurosurgery  *If you've not heard anything in 2 weeks, call the clinic and ask about the referral. Rolling walker  -Abdominal Pain  Contact information given to schedule your appointment.    [facility]  Located in: [facility] [address] Address: [address], [place], [place] [address] Phone: [phone] Take care and seek immediate care sooner if you develop any concerns.   Dr. Bess Kinds, MD [facility]

## 2023-05-04 NOTE — Telephone Encounter (Signed)
Rolator ordered for patient during office visit.   Community message sent to [employer] for processing.

## 2023-05-04 NOTE — Telephone Encounter (Signed)
Confirmation received.

## 2023-05-04 NOTE — Assessment & Plan Note (Signed)
Patient continues to have pain. She is following up on the referral, that was made after last visit. Information given to patient, she will call to f/u referral. -Patient to call [facility], information given

## 2023-05-10 ENCOUNTER — Encounter: Payer: Self-pay | Admitting: Gastroenterology

## 2023-05-13 ENCOUNTER — Ambulatory Visit (INDEPENDENT_AMBULATORY_CARE_PROVIDER_SITE_OTHER): Payer: BLUE CROSS/BLUE SHIELD | Admitting: Family Medicine

## 2023-05-13 ENCOUNTER — Encounter: Payer: Self-pay | Admitting: Family Medicine

## 2023-05-13 ENCOUNTER — Ambulatory Visit (HOSPITAL_COMMUNITY)
Admission: RE | Admit: 2023-05-13 | Discharge: 2023-05-13 | Disposition: A | Discharge status: Home / Self Care | Payer: BLUE CROSS/BLUE SHIELD | Source: Ambulatory Visit | Attending: Family Medicine | Admitting: Family Medicine

## 2023-05-13 VITALS — BP 132/91 | HR 82 | Ht 67.0 in | Wt 210.0 lb

## 2023-05-13 DIAGNOSIS — S8990XA Unspecified injury of unspecified lower leg, initial encounter: Secondary | ICD-10-CM | POA: Insufficient documentation

## 2023-05-13 NOTE — Progress Notes (Signed)
    SUBJECTIVE:   CHIEF COMPLAINT / HPI:   Bilateral knee pain Slipped and fell onto concrete 2 days ago in the rain, hit both of her knees when she landed Denies any twisting or popping of knees Has some pain down her left leg as well Pain is mostly anterior but has some pain along the sides of her joint lines as well, bilaterally Worse on left knee/leg Pain worsening over since her fall Has a couple of scrapes on her knees which have had some localized swelling and pain, but no diffuse knee swelling No hx knee surgeries   PERTINENT  PMH / PSH: T2DM, neuropathic pain  OBJECTIVE:   BP (!) 132/91   Pulse 82   Ht 5\' 7"  (1.702 m)   Wt 210 lb (95.3 kg)   SpO2 100%   BMI 32.89 kg/m    General: NAD, pleasant, able to participate in exam, uncomfortable due to pain Respiratory: No respiratory distress  Bilateral knees Inspection: Healing 1 cm scrapes/wounds on bilateral anterior knees with some underlying localized swelling about the wounds.  Otherwise, no gross deformities, ecchymoses, or swelling Palpation: Right knee largely nontender to palpation except around the scrape.  Left knee exquisitely tender to palpation especially over patella and medial/lateral joint lines.  Also slightly tender down left anterior leg ROM: Full range of motion bilaterally Strength: 5/5 Special tests: Negative anterior/posterior drawers, Apley's bilaterally.  Otherwise exam limited due to pain   ASSESSMENT/PLAN:   Assessment & Plan Knee injury, unspecified laterality, initial encounter Bilateral knee pain after injury due to mechanical fall where she slipped in the rain and landed on concrete.  Did not hit her head or pass out.  Low suspicion for meniscal or ligamentous injury given her exam and mechanism of her injury.  However, given significant tenderness of her left knee with history of injury will obtain radiograph to rule out acute fracture.  Otherwise, encouraged conservative RICE therapy and  use of OTC pain medications.  Follow-up as needed pending x-ray result and/or if no improvement BP elevated in the setting of pain  Vonna Drafts, MD [facility]

## 2023-05-13 NOTE — Patient Instructions (Signed)
Please go across the street to [facility] to obtain an x-ray of your left knee  I will let you know if any results are abnormal.  Otherwise I will send a message on MyChart  In the meantime, please continue using ibuprofen and Tylenol to help with your pain.  You can use an Ace wrap or compression stocking to keep the area from swelling.  Also try to keep the knee elevated and try using a pillow underneath it when you sleep.

## 2023-05-18 ENCOUNTER — Encounter: Payer: Self-pay | Admitting: Pharmacist

## 2023-05-18 ENCOUNTER — Other Ambulatory Visit: Payer: Self-pay | Admitting: *Deleted

## 2023-05-18 ENCOUNTER — Ambulatory Visit (INDEPENDENT_AMBULATORY_CARE_PROVIDER_SITE_OTHER): Payer: BLUE CROSS/BLUE SHIELD | Admitting: Pharmacist

## 2023-05-18 VITALS — BP 125/84 | HR 81 | Ht 67.0 in | Wt 210.6 lb

## 2023-05-18 DIAGNOSIS — Z794 Long term (current) use of insulin: Secondary | ICD-10-CM | POA: Diagnosis not present

## 2023-05-18 DIAGNOSIS — I10 Essential (primary) hypertension: Secondary | ICD-10-CM | POA: Diagnosis not present

## 2023-05-18 DIAGNOSIS — E11620 Type 2 diabetes mellitus with diabetic dermatitis: Secondary | ICD-10-CM

## 2023-05-18 DIAGNOSIS — E119 Type 2 diabetes mellitus without complications: Secondary | ICD-10-CM

## 2023-05-18 NOTE — Patient Instructions (Addendum)
It was nice to see you today!  Your goal blood sugar is 80-130 before eating and less than 180 after eating.  Medication Changes:  Decrease Tresiba (insulin degludec) to 8 units once daily.  Increase Crestor (rosuvastatin) 10 mg to 2 tablets daily.  Pepcid (famotidine) 20 mg should be available over the counter at your pharmacy.  Continue all other medication the same.   Keep up the good work with diet and exercise. Aim for a diet full of vegetables, fruit and lean meats (chicken, [place], fish). Try to limit salt intake by eating fresh or frozen vegetables (instead of canned), rinse canned vegetables prior to cooking and do not add any additional salt to meals.

## 2023-05-18 NOTE — Assessment & Plan Note (Addendum)
Diabetes longstanding currently controlled with latest GMI of 5.8%, with occasional low glucose readings. Patient is able to verbalize appropriate hypoglycemia management plan. Medication adherence appears good. Currently with multiple readings < 70 per week likely due to improved control and excess insulin dose.  - Continued Ozempic (semaglutide) 2 mg once weekly. - Continued metformin 1000 mg twice daily. - Decreased Tresiba (insulin degludec) from 14 to 8 units once daily. -Patient educated on purpose, proper use, and potential adverse effects.  -Extensively discussed pathophysiology of diabetes, recommended lifestyle interventions, dietary effects on blood sugar control.  -Counseled on s/sx of and management of hypoglycemia.   ASCVD risk - primary prevention in patient with diabetes. Last LDL (03/2023) is 104 not at goal of <70 mg/dL.  - Increased Crestor (rosuvastatin) from 10 to 20 mg once daily.

## 2023-05-18 NOTE — Assessment & Plan Note (Signed)
Hypertension longstanding currently controlled with in-office reading of 125/84 mm Hg. Blood pressure goal of <130/80 mmHg. Medication adherence appears good. Blood pressure control is suboptimal due to recent stress from knee injury. - Continued hydrochlorothiazide 12.5 mg once daily. - Continued olmesartan 40 mg once daily. - Continued metoprolol succinate 50 mg once daily.

## 2023-05-18 NOTE — Progress Notes (Signed)
S:     Chief Complaint  Patient presents with   Medication Management    Diabetes - Lipid follow up   57 y.o. female who presents for diabetes evaluation, education, and management. Patient arrives in good spirits and presents without any any assistance.   Patient was referred and last seen by Primary Care Provider, Dr. Barbaraann Faster, on 05/04/23.   PMH is significant for HTN, Hyperlipidemia and Neuropathy.  At last visit, no changes to medication regimen were made.   Patient reports recent knee injury. She states she was helping her mother move into her son's house and the rain caused her to slip. She reports landing under her jeep and when she got up, her knees were "all torn up." She states that the fall has caused her a lot of pain in her hips as well. She has been coming into [facility] to get her knees checked frequently.  Patient reports Diabetes was diagnosed in 2011.   Family/Social History: none  Current diabetes medications include: Ozempic (semaglutide) 2 mg once weekly, metformin 1000 mg twice daily, Tresiba (insulin degludec) from 14 to 8 units once daily Current hypertension medications include: hydrochlorothiazide 12.5 mg once daily, olmesartan 40 mg once daily, metoprolol succinate 50 mg once daily. Current hyperlipidemia medications include: Crestor (rosuvastatin) 10 mg once daily  Patient reports adherence to taking all medications as prescribed.   Do you feel that your medications are working for you? yes Have you been experiencing any side effects to the medications prescribed? no  Insurance coverage: BCBS  Patient reports hypoglycemic events - glucose reading ~ 50 mg/dL yesterday night when patient was at dinner eating pizza.  Patient reports nocturia (nighttime urination) - "too many times." Patient denies neuropathy (nerve pain). Patient reports visual changes. Patient reports self foot exams.   Dietary habits: patient reports reducing Pepsi intake to 1  drink per day  Patient-reported exercise habits: limited physical activity due to current knee injury  O:   Review of Systems  Musculoskeletal:  Positive for falls and joint pain (bilateral knee wounds).  All other systems reviewed and are negative.   Physical Exam Pulmonary:     Effort: Pulmonary effort is normal.  Neurological:     Mental Status: She is alert.  Psychiatric:        Mood and Affect: Mood normal.        Behavior: Behavior normal.        Thought Content: Thought content normal.        Judgment: Judgment normal.    7 day average blood glucose: 104  Libre3 CGM Download today on 05/17/21 % Time CGM is active: 82% Average Glucose: 104 mg/dL Glucose Management Indicator: 5.8%  Glucose Variability: 18.4% (goal <36%) Time in Goal:  - Time in range 70-180: 0% - Time above range: 100% - Time below range: 0%   Lab Results  Component Value Date   HGBA1C 7.2 (H) 04/13/2023   Vitals:   05/18/23 1345  BP: 125/84  Pulse: 81  SpO2: 100%    Lipid Panel     Component Value Date/Time   CHOL 169 04/13/2023 1220   TRIG 140 04/13/2023 1220   HDL 40 04/13/2023 1220   CHOLHDL 4.2 04/13/2023 1220   CHOLHDL 5 11/17/2021 1212   VLDL 45.0 (H) 11/17/2021 1212   LDLCALC 104 (H) 04/13/2023 1220   LDLDIRECT 117.0 11/17/2021 1212    Clinical Atherosclerotic Cardiovascular Disease (ASCVD): No  The 10-year ASCVD risk score (Arnett  DK, et al., 2019) is: 12.7%   Values used to calculate the score:     Age: 64 years     Sex: Female     Is Non-Hispanic African American: Yes     Diabetic: Yes     Tobacco smoker: No     Systolic Blood Pressure: 125 mmHg     Is BP treated: Yes     HDL Cholesterol: 40 mg/dL     Total Cholesterol: 169 mg/dL   A/P: Diabetes longstanding currently controlled with latest GMI of 5.8%, with occasional low glucose readings. Patient is able to verbalize appropriate hypoglycemia management plan. Medication adherence appears good. Currently with  multiple readings < 70 per week likely due to improved control and excess insulin dose.  - Continued Ozempic (semaglutide) 2 mg once weekly. - Continued metformin 1000 mg twice daily. - Decreased Tresiba (insulin degludec) from 14 to 8 units once daily. -Patient educated on purpose, proper use, and potential adverse effects.  -Extensively discussed pathophysiology of diabetes, recommended lifestyle interventions, dietary effects on blood sugar control.  -Counseled on s/sx of and management of hypoglycemia.   ASCVD risk - primary prevention in patient with diabetes. Last LDL (03/2023) is 104 not at goal of <70 mg/dL.  - Increased Crestor (rosuvastatin) from 10 to 20 mg once daily.  Hypertension longstanding currently controlled with in-office reading of 125/84 mm Hg. Blood pressure goal of <130/80 mmHg. Medication adherence appears good. Blood pressure control is suboptimal due to recent stress from knee injury. - Continued hydrochlorothiazide 12.5 mg once daily. - Continued olmesartan 40 mg once daily. - Continued metoprolol succinate 50 mg once daily.  Written patient instructions provided. Patient verbalized understanding of treatment plan.  Total time in face to face counseling 34 minutes.    Follow-up:  Pharmacist visit on 06/08/23 PCP clinic visit PRN Patient seen with Andee Poles, PharmD Candidate.

## 2023-05-19 MED ORDER — TRESIBA FLEXTOUCH 200 UNIT/ML ~~LOC~~ SOPN
14.0000 [IU] | PEN_INJECTOR | Freq: Every day | SUBCUTANEOUS | 2 refills | Status: AC
Start: 2023-05-19 — End: ?

## 2023-05-19 NOTE — Progress Notes (Signed)
Reviewed and agree with Dr Koval's plan.   

## 2023-05-20 NOTE — Telephone Encounter (Signed)
Received returned call from pharmacy regarding request for Tresiba. This was ordered as "no print."   In order to be resent electronically, please include dispense quantity and number of refills.   Veronda Prude, RN

## 2023-05-20 NOTE — Addendum Note (Signed)
Addended by: Veronda Prude on: 05/20/2023 05:05 PM   Modules accepted: Orders

## 2023-05-21 MED ORDER — TRESIBA FLEXTOUCH 200 UNIT/ML ~~LOC~~ SOPN
14.0000 [IU] | PEN_INJECTOR | Freq: Every day | SUBCUTANEOUS | 2 refills | Status: DC
Start: 2023-05-21 — End: 2023-05-24

## 2023-05-24 ENCOUNTER — Other Ambulatory Visit: Payer: Self-pay | Admitting: Student

## 2023-05-24 ENCOUNTER — Telehealth: Payer: Self-pay

## 2023-05-24 DIAGNOSIS — E119 Type 2 diabetes mellitus without complications: Secondary | ICD-10-CM

## 2023-05-24 MED ORDER — TRESIBA FLEXTOUCH 200 UNIT/ML ~~LOC~~ SOPN
8.0000 [IU] | PEN_INJECTOR | Freq: Every day | SUBCUTANEOUS | 2 refills | Status: DC
Start: 2023-05-24 — End: 2023-05-26

## 2023-05-24 NOTE — Progress Notes (Signed)
Rx for new Tresiba dose requested, following pharmacy change from 14 to 8 units daily.

## 2023-05-24 NOTE — Telephone Encounter (Signed)
Received call from [facility] at [facility] Department regarding Isabel Berger dosage. She needs clarification on which dose patient should be on.   Last rx on 05/21/23, indicates that patient should be taking 14 units daily, however, most recent note from Dr. Raymondo Band states to take 8 units daily.   If patient is supposed to be taking 8 units, pharmacy will need new prescription with this update.   Please advise.   Veronda Prude, RN

## 2023-05-26 ENCOUNTER — Other Ambulatory Visit: Payer: Self-pay | Admitting: Neurosurgery

## 2023-05-26 DIAGNOSIS — M5416 Radiculopathy, lumbar region: Secondary | ICD-10-CM

## 2023-05-26 DIAGNOSIS — M533 Sacrococcygeal disorders, not elsewhere classified: Secondary | ICD-10-CM

## 2023-05-26 MED ORDER — TRESIBA FLEXTOUCH 200 UNIT/ML ~~LOC~~ SOPN: 8.0000 [IU] | PEN_INJECTOR | Freq: Every day | SUBCUTANEOUS | 2 refills | Status: DC

## 2023-05-26 NOTE — Telephone Encounter (Signed)
Called and canceled prescription at [facility] and resent to [facility] Department.   Called and provided update to [facility].   Veronda Prude, RN

## 2023-06-08 ENCOUNTER — Ambulatory Visit: Payer: BLUE CROSS/BLUE SHIELD | Admitting: Pharmacist

## 2023-06-08 ENCOUNTER — Encounter: Payer: Self-pay | Admitting: Pharmacist

## 2023-06-08 VITALS — BP 145/80 | HR 77 | Wt 209.8 lb

## 2023-06-08 DIAGNOSIS — E11620 Type 2 diabetes mellitus with diabetic dermatitis: Secondary | ICD-10-CM

## 2023-06-08 DIAGNOSIS — Z794 Long term (current) use of insulin: Secondary | ICD-10-CM | POA: Diagnosis not present

## 2023-06-08 NOTE — Assessment & Plan Note (Signed)
Diabetes longstanding currently controlled. Patient is able to verbalize appropriate hypoglycemia management plan. Medication adherence appears good.  -Discontinued basal insulin Tresiba (insulin degludec)  - Continued semaglutide (Ozempic) 2 mg once weekly - Continued metformin 1000 mg twice daily -Extensively discussed pathophysiology of diabetes, recommended lifestyle interventions, dietary effects on blood sugar control.  -Counseled on s/sx of and management of hypoglycemia.  - Due to MRI on Friday (06/11/2023) Pt will remove CGM before scan, provided Pt with replacement CGM for after MRI.  - Plan to change over from [place] 2 to [place] 3 at next visit.

## 2023-06-08 NOTE — Patient Instructions (Addendum)
It was nice to see you today!  Your goal blood sugar is 80-130 before eating and less than 180 after eating.  Medication Changes:  Discontinue insulin degludec Evaristo Bury)   Continue all other medication the same.   Keep up the good work with diet and exercise. Aim for a diet full of vegetables, fruit and lean meats (chicken, [place], fish). Try to limit salt intake by eating fresh or frozen vegetables (instead of canned), rinse canned vegetables prior to cooking and do not add any additional salt to meals.

## 2023-06-08 NOTE — Progress Notes (Signed)
S:     Chief Complaint  Patient presents with   Medication Management    T2DM F/U   57 y.o. female who presents for diabetes evaluation, education, and management. Patient arrives in good spirits and presents without any assistance.   Patient was referred and last seen by Primary Care Provider, Dr. Barbaraann Faster, on 05/24/2023.   PMH is significant for T2DM, Neuropathy, HTN, HLD At last visit, insulin degludec Evaristo Bury) was decreased from 14 units daily to 8 units daily.   Pt. fell several weeks ago and her knees were "all torn up", this caused a lot of back and leg pain. Reports knees wounds are much better. Pt reports she will be having a MRI on Friday due to pain. Reports lethargy nearly daily, has very low energy, preventing her from doing certain activities with daughter.   Patient reports Diabetes was diagnosed in 2011.   Family/Social History: None  Current diabetes medications include: insulin degludec Evaristo Bury) 8 units daily, metformin 1000 mg BID, semaglutide (Ozempic) 2 mg weekly Current hypertension medications include: hydrochlorothiazide 12.5 mg daily, olmesartan 40 mg daily, metoprolol succinate 50 mg daily Current hyperlipidemia medications include: rosuvastatin 20 mg daily  Patient reports adherence to taking all medications as prescribed, except non-adherent to metoprolol succinate. Does not like to take metoprolol, only takes when "she feels she needs it".   Do you feel that your medications are working for you? yes Have you been experiencing any side effects to the medications prescribed? no Do you have any problems obtaining medications due to transportation or finances? no Insurance coverage: BCBS  Patient reports hypoglycemic events. Does report lows at night that prompt her to get something to drink/eat, regularly. Pt keeps juice and crackers close to her bed.    Patient reports nocturia (nighttime urination). ~ 6-7 times nightly Patient reports neuropathy  (nerve pain). Patient reports visual changes.  Patient reports self foot exams.  Patient reported dietary habits: Eats ~1 meals/day (some days doesn't really eat) Drinks: water, soda (Pepsi)  Patient-reported exercise habits: limited physical activity due to back/leg pain  O:   Review of Systems  Constitutional:  Positive for malaise/fatigue.  Musculoskeletal:  Positive for back pain and joint pain.  All other systems reviewed and are negative.   Physical Exam Constitutional:      Appearance: Normal appearance.  Pulmonary:     Effort: Pulmonary effort is normal.  Neurological:     Mental Status: She is alert.  Psychiatric:        Mood and Affect: Mood normal.        Behavior: Behavior normal.        Thought Content: Thought content normal.        Judgment: Judgment normal.    7 day average blood glucose: 102  Libre3 CGM Download today on 06/08/2023 % Time CGM is active: 73% Average Glucose: 102 mg/dL Glucose Management Indicator: 5.7  Glucose Variability: 21.7% (goal <36%) Time in Goal:  - Time in range 70-180: 99% - Time above range: 0% - Time below range: 1%  Lab Results  Component Value Date   HGBA1C 7.2 (H) 04/13/2023   Vitals:   06/08/23 1145 06/08/23 1146  BP: (!) 150/87 (!) 145/80  Pulse: 77   SpO2: 100%     Lipid Panel     Component Value Date/Time   CHOL 169 04/13/2023 1220   TRIG 140 04/13/2023 1220   HDL 40 04/13/2023 1220   CHOLHDL 4.2 04/13/2023 1220  CHOLHDL 5 11/17/2021 1212   VLDL 45.0 (H) 11/17/2021 1212   LDLCALC 104 (H) 04/13/2023 1220   LDLDIRECT 117.0 11/17/2021 1212    Clinical Atherosclerotic Cardiovascular Disease (ASCVD): No  The 10-year ASCVD risk score (Arnett DK, et al., 2019) is: 20.2%   Values used to calculate the score:     Age: 72 years     Sex: Female     Is Non-Hispanic African American: Yes     Diabetic: Yes     Tobacco smoker: No     Systolic Blood Pressure: 145 mmHg     Is BP treated: Yes     HDL  Cholesterol: 40 mg/dL     Total Cholesterol: 169 mg/dL   A/P: Diabetes longstanding currently controlled. Patient is able to verbalize appropriate hypoglycemia management plan. Medication adherence appears good.  -Discontinued basal insulin Tresiba (insulin degludec)  - Continued semaglutide (Ozempic) 2 mg once weekly - Continued metformin 1000 mg twice daily -Extensively discussed pathophysiology of diabetes, recommended lifestyle interventions, dietary effects on blood sugar control.  -Counseled on s/sx of and management of hypoglycemia.  - Due to MRI on Friday (06/11/2023) Pt will remove CGM before scan, provided Pt with replacement CGM for after MRI.  - Plan to change over from [place] 2 to [place] 3 at next visit.   ASCVD risk - primary prevention in patient with diabetes. Last LDL is 104 (03/2023) not at goal of <70  mg/dL.  -Continued rosuvastatin 20 mg daily.   Hypertension longstanding currently uncontrolled. Blood pressure goal of <130/80 mmHg. Medication adherence okay, adherent to all medications except for metoprolol succinate 50 mg . Blood pressure control is suboptimal due to medication non-adherence. -Continued hydrochlorothiazide 12.5 mg daily, olmesartan 40 mg daily, metoprolol succinate 50 mg daily  Written patient instructions provided. Patient verbalized understanding of treatment plan.  Total time in face to face counseling 26 minutes.    Follow-up:  Pharmacist 06/24/2023 PCP clinic visit PRN Patient seen with Shona Simpson, PharmD Candidate.

## 2023-06-10 NOTE — Progress Notes (Signed)
Reviewed and agree with Dr Koval's plan.   

## 2023-06-24 ENCOUNTER — Encounter: Payer: Self-pay | Admitting: Pharmacist

## 2023-06-24 ENCOUNTER — Ambulatory Visit: Payer: BLUE CROSS/BLUE SHIELD | Admitting: Pharmacist

## 2023-06-24 VITALS — BP 125/79 | HR 75 | Wt 208.6 lb

## 2023-06-24 DIAGNOSIS — E11620 Type 2 diabetes mellitus with diabetic dermatitis: Secondary | ICD-10-CM | POA: Diagnosis not present

## 2023-06-24 DIAGNOSIS — Z794 Long term (current) use of insulin: Secondary | ICD-10-CM | POA: Diagnosis not present

## 2023-06-24 MED ORDER — FREESTYLE LIBRE 3 SENSOR MISC
11 refills | Status: DC
Start: 2023-06-24 — End: 2023-08-03

## 2023-06-24 MED ORDER — TIRZEPATIDE 10 MG/0.5ML ~~LOC~~ SOAJ: 10.0000 mg | SUBCUTANEOUS | 1 refills | Status: DC

## 2023-06-24 NOTE — Assessment & Plan Note (Signed)
Diabetes longstanding currently controlled. Patient is able to verbalize appropriate hypoglycemia management plan. Medication adherence appears good. Patient expressed interest in switching to tirzepatide Greggory Keen) to assist in weight loss, her insurance indicates coverage.  - Started tirzepatide (Mounjaro) 10 mg once weekly in two weeks. - Stopped semaglutide (Ozempic) 2 mg once weekly following next week's dose (last in her current pen). - Transitioned from [place] 2 CGM to [place] 3 CGM, assisted in placement of Libre 3 sensor today -Patient educated on purpose, proper use, and potential adverse effects of tirzepatide Greggory Keen) including GI upset..  -Extensively discussed pathophysiology of diabetes, recommended lifestyle interventions, dietary effects on blood sugar control.  -Counseled on s/sx of and management of hypoglycemia.

## 2023-06-24 NOTE — Progress Notes (Signed)
S:     Chief Complaint  Patient presents with   Medication Management    Diabetes - CGM Libre 63   57 y.o. female who presents for diabetes evaluation, education, and management. Patient arrives in good spirits and presents without any assistance.   Patient was last seen by Primary Care Provider, Dr. Barbaraann Faster, on 05/24/2023.   PMH is significant for T2DM, Neuropathy, HTN, HLD.  At last pharmacy visit on 06/08/2023, discontinued Tresiba (insulin degludec). Continued metformin 1000 mg twice daily, semaglutide (Ozempic) 2 mg once weekly.   Patient reports Diabetes was diagnosed in 2011.   Current diabetes medications include: semaglutide (Ozempic) 2 mg once weekly Current hypertension medications include: hydrochlorothiazide 12.5 mg, metoprolol succinate 50 mg once daily, olmesartan 40 mg once daily Current hyperlipidemia medications include: rosuvastatin 10 mg once daily  Patient reports adherence to taking all medications as prescribed.   Do you feel that your medications are working for you? yes Have you been experiencing any side effects to the medications prescribed? no Insurance coverage: [employer]  Patient denies hypoglycemic events.  Patient reports nocturia (nighttime urination). Reports ~4x nightly (decreased from 6-7x) Patient reports neuropathy (nerve pain). Patient reports visual changes. Patient reports self foot exams.   Patient reported dietary habits: Eats 2 meals/day Breakfast: breakfast sandwich Drinks: water, pepsi  Patient-reported exercise habits: walking everyday ~30 mins  O:   Review of Systems  All other systems reviewed and are negative.   Physical Exam Vitals reviewed.  Pulmonary:     Effort: Pulmonary effort is normal.  Neurological:     Mental Status: She is alert.  Psychiatric:        Mood and Affect: Mood normal.        Behavior: Behavior normal.        Thought Content: Thought content normal.        Judgment: Judgment normal.     Libre3 CGM Download Oct 13-26 % Time CGM is active: 40% Average Glucose: 101 mg/dL Glucose Management Indicator: N/A   Glucose Variability: 22.6% (goal <36%) Time in Goal:  - Time in range 70-180: 98% - Time above range: 0% - Time below range: 2%  Lab Results  Component Value Date   HGBA1C 7.2 (H) 04/13/2023   Vitals:   06/24/23 1035  BP: 125/79  Pulse: 75  SpO2: 100%    Lipid Panel     Component Value Date/Time   CHOL 169 04/13/2023 1220   TRIG 140 04/13/2023 1220   HDL 40 04/13/2023 1220   CHOLHDL 4.2 04/13/2023 1220   CHOLHDL 5 11/17/2021 1212   VLDL 45.0 (H) 11/17/2021 1212   LDLCALC 104 (H) 04/13/2023 1220   LDLDIRECT 117.0 11/17/2021 1212    Clinical Atherosclerotic Cardiovascular Disease (ASCVD):  The 10-year ASCVD risk score (Arnett DK, et al., 2019) is: 12.7%   Values used to calculate the score:     Age: 57 years     Sex: Female     Is Non-Hispanic African American: Yes     Diabetic: Yes     Tobacco smoker: No     Systolic Blood Pressure: 125 mmHg     Is BP treated: Yes     HDL Cholesterol: 40 mg/dL     Total Cholesterol: 169 mg/dL   A/P: Diabetes longstanding currently controlled. Patient is able to verbalize appropriate hypoglycemia management plan. Medication adherence appears good. Patient expressed interest in switching to tirzepatide Greggory Keen) to assist in weight loss, her insurance indicates coverage.  -  Started tirzepatide (Mounjaro) 10 mg once weekly in two weeks. - Stopped semaglutide (Ozempic) 2 mg once weekly following next week's dose (last in her current pen). - Transitioned from [place] 2 CGM to [place] 3 CGM, assisted in placement of Libre 3 sensor today -Patient educated on purpose, proper use, and potential adverse effects of tirzepatide Greggory Keen) including GI upset..  -Extensively discussed pathophysiology of diabetes, recommended lifestyle interventions, dietary effects on blood sugar control.  -Counseled on s/sx of and  management of hypoglycemia.   Written patient instructions provided. Patient verbalized understanding of treatment plan.  Total time in face to face counseling 28 minutes.    Follow-up:  Pharmacist 1 month PCP clinic visit in PRN Patient seen with Shona Simpson, PharmD Candidate.

## 2023-06-24 NOTE — Patient Instructions (Signed)
It was nice to see you today!  Your goal blood sugar is 80-130 before eating and less than 180 after eating.  Medication Changes: START Mounjaro (tirzepatide) 10 mg once weekly START Libre 3 continuous glucose monitors  Discontinue Ozempic (semaglutide) 2 mg once weekly  Continue all other medication the same.  Keep up the good work with diet and exercise. Aim for a diet full of vegetables, fruit and lean meats (chicken, [place], fish). Try to limit salt intake by eating fresh or frozen vegetables (instead of canned), rinse canned vegetables prior to cooking and do not add any additional salt to meals.

## 2023-06-24 NOTE — Progress Notes (Signed)
Reviewed and agree with Dr Koval's plan.   

## 2023-06-29 ENCOUNTER — Telehealth: Payer: Self-pay

## 2023-06-29 NOTE — Telephone Encounter (Signed)
Patient calls nurse line in regards to [facility].   She reports the pharmacy stated the medication needs a PA.  Will forward to pharmacy team.

## 2023-07-03 NOTE — Telephone Encounter (Signed)
Pharmacy Patient Advocate Encounter   Received notification from Phone that prior authorization for [facility] is required/requested.   Insurance verification completed.   The patient is insured through [facility] .   Per test claim: PA required; PA submitted to above mentioned insurance via CoverMyMeds Key/confirmation #/EOC [id number]. Status is pending

## 2023-07-09 NOTE — Telephone Encounter (Signed)
Pharmacy Patient Advocate Encounter  Received notification from [facility] that Prior Authorization for [facility] has been APPROVED from 07/03/23 to 07/02/24

## 2023-07-26 ENCOUNTER — Ambulatory Visit: Payer: BLUE CROSS/BLUE SHIELD | Admitting: Pharmacist

## 2023-07-29 ENCOUNTER — Other Ambulatory Visit: Payer: Self-pay | Admitting: Student

## 2023-07-29 DIAGNOSIS — E1165 Type 2 diabetes mellitus with hyperglycemia: Secondary | ICD-10-CM

## 2023-08-03 ENCOUNTER — Other Ambulatory Visit (HOSPITAL_COMMUNITY): Payer: Self-pay

## 2023-08-03 ENCOUNTER — Other Ambulatory Visit (INDEPENDENT_AMBULATORY_CARE_PROVIDER_SITE_OTHER): Payer: BLUE CROSS/BLUE SHIELD | Admitting: Pharmacist

## 2023-08-03 DIAGNOSIS — Z794 Long term (current) use of insulin: Secondary | ICD-10-CM

## 2023-08-03 DIAGNOSIS — E11620 Type 2 diabetes mellitus with diabetic dermatitis: Secondary | ICD-10-CM

## 2023-08-03 MED ORDER — FREESTYLE LIBRE 3 SENSOR MISC
11 refills | Status: DC
  Filled 2023-08-03: qty 2, 28d supply, fill #0

## 2023-08-03 NOTE — Patient Instructions (Signed)
Asked to reschedule.  New Rx for sensor sent.

## 2023-08-03 NOTE — Progress Notes (Signed)
Patient contacted for follow-up of missed visit with me on 12/2 Patient reports doing well with Mournjaro (tirzepatide)  Since last contact patient reports she has been unable to get the [place] 3 sensors from [facility], [facility] or [facility].  We agreed that I would send to [facility] outpatient community pharmacy.  She was appreciative.   Current Medications include: Tirzepatide 10mg  weekly Patient denies any significant medication related side effects.  Medication Plan: New libre 3 sensor prescription sent.  -Asked patient to schedule follow-up at her convenience in January.    Total time with patient call and documentation of interaction: 12 minutes.  F/U Phone call planned: None

## 2023-08-04 ENCOUNTER — Ambulatory Visit: Payer: BLUE CROSS/BLUE SHIELD | Admitting: Gastroenterology

## 2023-08-04 NOTE — Progress Notes (Signed)
Reviewed and agree with Dr Koval's plan.   

## 2023-08-13 ENCOUNTER — Other Ambulatory Visit (HOSPITAL_COMMUNITY): Payer: Self-pay

## 2023-08-25 ENCOUNTER — Other Ambulatory Visit: Payer: Self-pay | Admitting: Family Medicine

## 2023-08-25 DIAGNOSIS — Z794 Long term (current) use of insulin: Secondary | ICD-10-CM

## 2023-10-03 IMAGING — MR MR ELBOW*L* W/O CM
5 series · 40 of 40 positions shown · non-contrast
Comparison: None.

CLINICAL DATA: Elbow pain, chronic, no prior imaging chronic left
elbow pain

EXAM:
MRI OF THE LEFT ELBOW WITHOUT CONTRAST
TECHNIQUE: Multiplanar, multisequence MR imaging of the elbow was performed. No
intravenous contrast was administered.

[Series 3: T1 · axial · 4.0mm · 0.44mm/px · z∈[-90,+57]mm · 10 of 35 slices shown (1 of 2)]
[im 1/35]
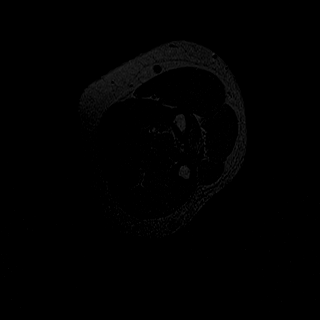
[im 4/35]
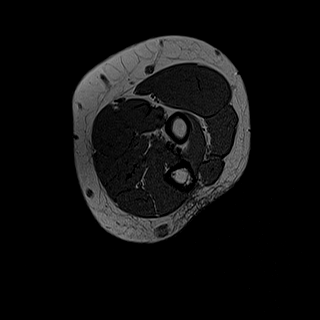
[im 8/35]
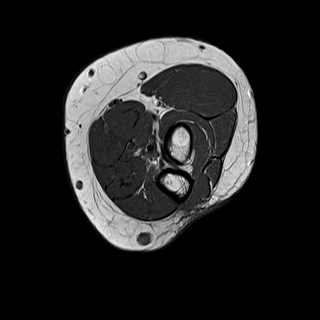
[im 12/35]
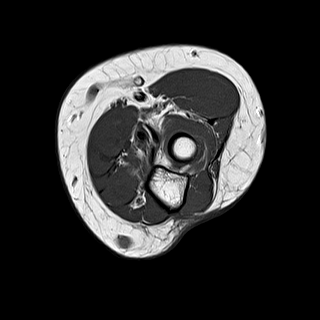
[im 16/35]
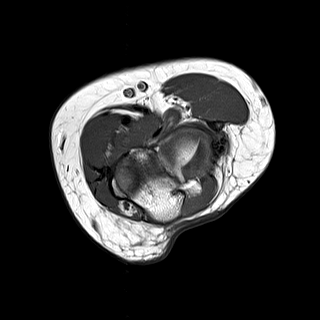
[im 19/35]
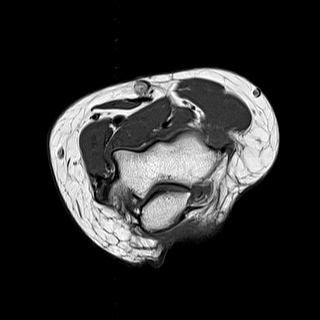
[im 23/35]
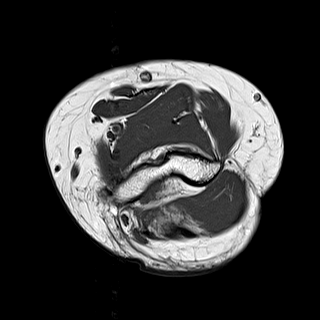
[im 27/35]
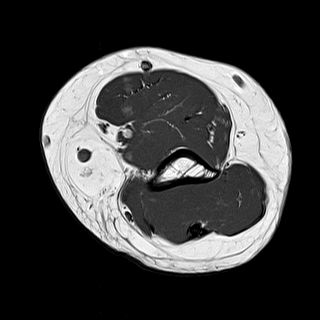
[im 31/35]
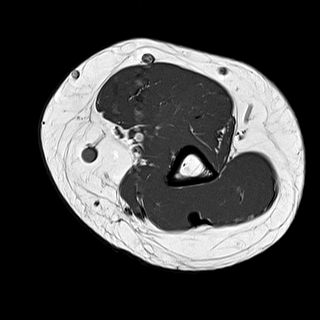
[im 35/35]
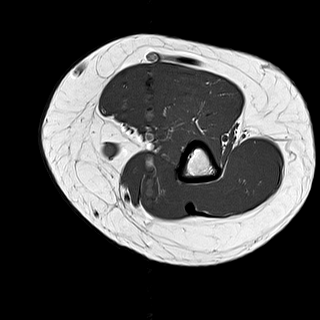

[Series 4: T2 fat-sat · axial · 4.0mm · 0.55mm/px · z∈[-90,+57]mm · 9 of 35 slices shown (1 of 3)]
[im 1/35]
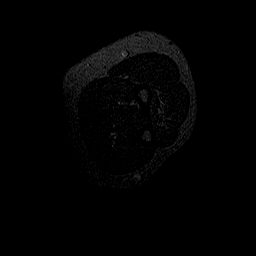
[im 5/35]
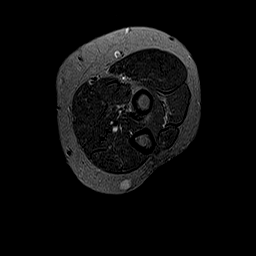
[im 9/35]
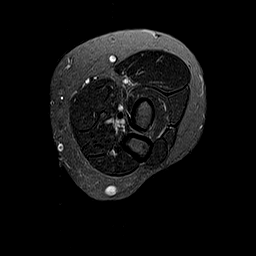
[im 13/35]
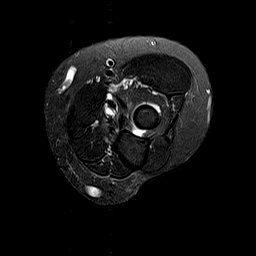
[im 18/35]
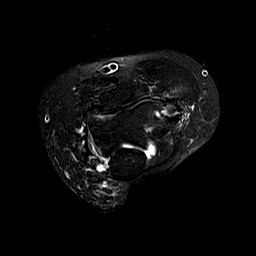
[im 22/35]
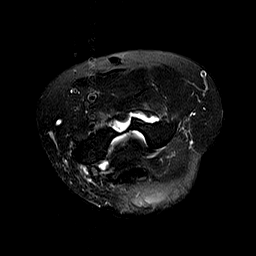
[im 26/35]
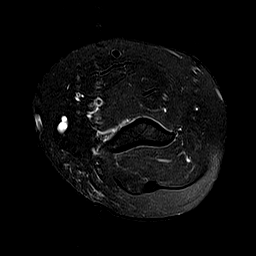
[im 30/35]
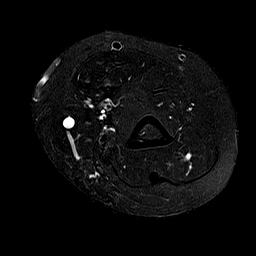
[im 35/35]
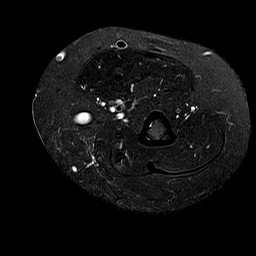

[Series 6: T2 fat-sat · sagittal · 3.0mm · 0.55mm/px · 9 of 33 slices shown (2 of 3)]
[im 1/33]
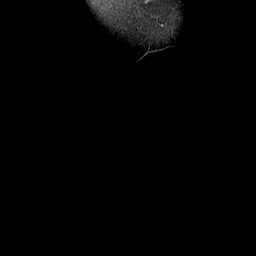
[im 5/33]
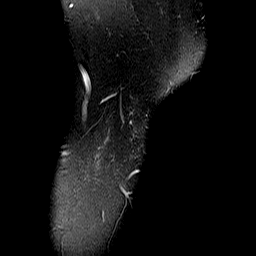
[im 9/33]
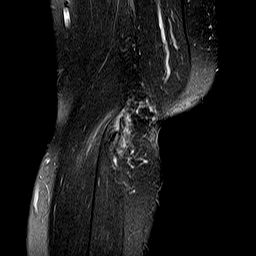
[im 13/33]
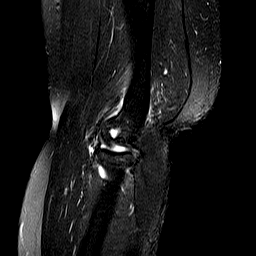
[im 17/33]
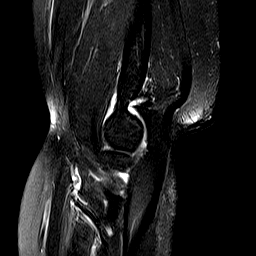
[im 21/33]
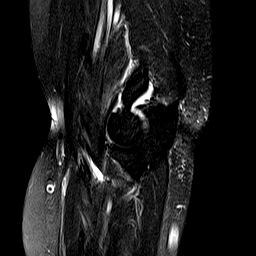
[im 25/33]
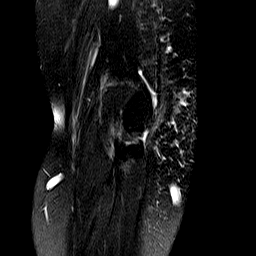
[im 29/33]
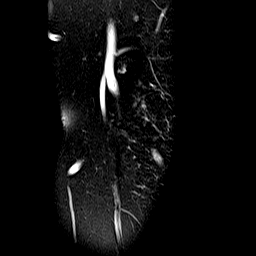
[im 33/33]
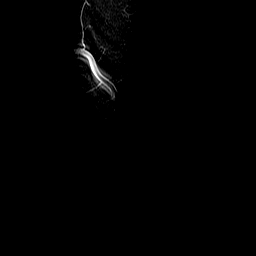

[Series 7: T2 fat-sat · coronal · 4.0mm · 0.55mm/px · 6 of 23 slices shown (3 of 3)]
[im 1/23]
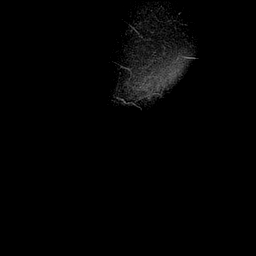
[im 5/23]
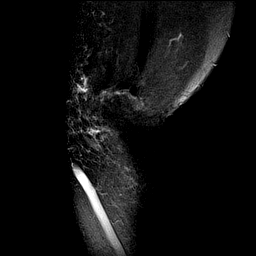
[im 9/23]
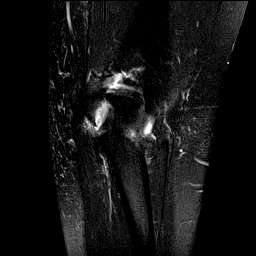
[im 14/23]
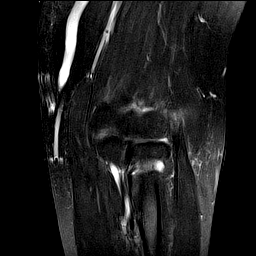
[im 18/23]
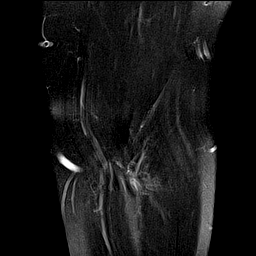
[im 23/23]
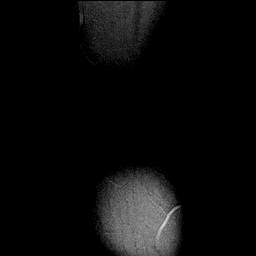

[Series 8: T1 · coronal · 4.0mm · 0.50mm/px · 6 of 23 slices shown (2 of 2)]
[im 1/23]
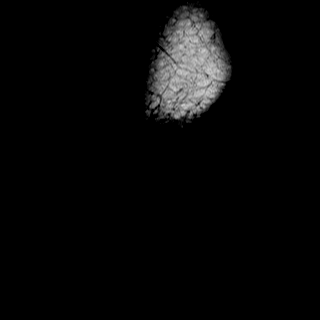
[im 5/23]
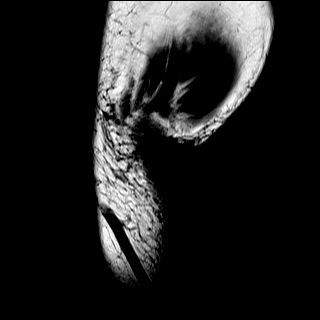
[im 9/23]
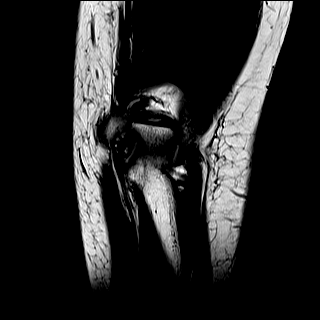
[im 14/23]
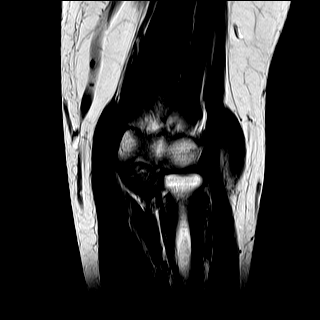
[im 18/23]
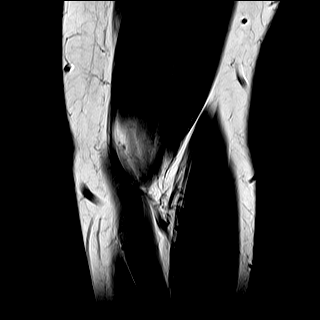
[im 23/23]
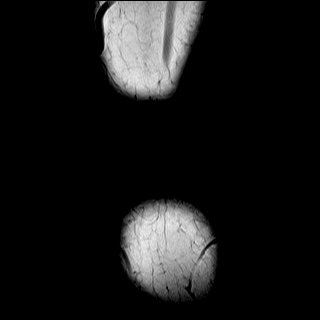

[40 of 40 positions shown; findings below may reference images not displayed]

FINDINGS: Technical Note: Despite efforts by the technologist and patient,
motion artifact is present on today's exam and could not be
eliminated. This reduces exam sensitivity and specificity.

TENDONS

Common forearm flexor origin: Intact without tendinosis or tear.

Common forearm extensor origin: Full-thickness, near complete tear
of the common extensor tendon origin from the lateral humeral
epicondyle with only minimal intact tendon fibers remaining. Mild
peritendinous edema.

Biceps: Intact without tendinosis or tear. No bicipitoradialis
bursal fluid.

Triceps: Intact.

LIGAMENTS

Medial stabilizers: Grossly intact.

Lateral stabilizers: Radial collateral and proximal lateral ulnar
collateral ligaments are thickened and likely at least partially
torn. Evaluation slightly degraded by motion.

Cartilage: Mild chondral thinning.

Joint: Small elbow joint effusion.

Cubital tunnel: Unremarkable.  The ulnar nerve appears normal.

Bones: No acute fracture. No dislocation. No bone marrow edema. No
bone lesion.

Muscles: Mild intramuscular edema within the extensor compartment
proximally.

Soft tissues: No soft tissue edema or fluid collection.

Other: None.
IMPRESSION: 1. Full-thickness, near-complete tear of the common extensor tendon
origin from the lateral humeral epicondyle.
2. Radial collateral and proximal lateral ulnar collateral ligaments
are thickened and likely at least partially torn.
3. Small elbow joint effusion.

## 2023-10-19 ENCOUNTER — Ambulatory Visit: Payer: BLUE CROSS/BLUE SHIELD | Admitting: Gastroenterology

## 2023-10-28 ENCOUNTER — Other Ambulatory Visit: Payer: Self-pay | Admitting: Student

## 2023-10-28 DIAGNOSIS — R1032 Left lower quadrant pain: Secondary | ICD-10-CM

## 2023-11-16 ENCOUNTER — Ambulatory Visit (INDEPENDENT_AMBULATORY_CARE_PROVIDER_SITE_OTHER): Admitting: Student

## 2023-11-16 ENCOUNTER — Encounter: Payer: Self-pay | Admitting: Student

## 2023-11-16 VITALS — BP 138/70 | HR 91 | Ht 67.0 in | Wt 205.2 lb

## 2023-11-16 DIAGNOSIS — Z23 Encounter for immunization: Secondary | ICD-10-CM | POA: Diagnosis not present

## 2023-11-16 DIAGNOSIS — E11620 Type 2 diabetes mellitus with diabetic dermatitis: Secondary | ICD-10-CM | POA: Diagnosis not present

## 2023-11-16 DIAGNOSIS — R109 Unspecified abdominal pain: Secondary | ICD-10-CM

## 2023-11-16 DIAGNOSIS — I10 Essential (primary) hypertension: Secondary | ICD-10-CM | POA: Diagnosis not present

## 2023-11-16 DIAGNOSIS — Z794 Long term (current) use of insulin: Secondary | ICD-10-CM | POA: Diagnosis not present

## 2023-11-16 LAB — POCT GLYCOSYLATED HEMOGLOBIN (HGB A1C): HbA1c, POC (controlled diabetic range): 5.5 % (ref 0.0–7.0)

## 2023-11-16 NOTE — Progress Notes (Unsigned)
    SUBJECTIVE:   CHIEF COMPLAINT / HPI:   Abdominal pain Pt presents with severe left-sided abdominal pain for the past five days. She describes the pain as a '10 plus' on a scale of 1 to 10, and it is so severe that it prevents her from working and moving around. The pain is located in the left upper and lower quadrants of the abdomen and is worse when lying flat. Applying pressure to the area provides some relief. Accompanying symptoms include nausea for the past five days, diarrhea for two days, followed by constipation. The patient reports that her stools are soft when she does pass. She has been able to eat and drink, but only in small amounts throughout the day.  States this feels like previous diverticulitis.  She was referred to a GI specialist in the past but did not follow up.   PERTINENT  PMH / PSH: HTN, T2DM, diverticulosis  OBJECTIVE:   BP 138/70   Pulse 91   Ht 5\' 7"  (1.702 m)   Wt 205 lb 3.2 oz (93.1 kg)   SpO2 100%   BMI 32.14 kg/m    General: Seems to be in mild distress at times due to pain but this is intermittent and overall is well-appearing Cardiac: RRR, no murmurs. Respiratory: CTAB, normal effort, No wheezes, rales or rhonchi Abdomen: Abdomen soft, tender to palpation in left upper and lower quadrants with involuntary guarding, bowel sounds present, non distended Skin: warm and dry Neuro: alert, no obvious focal deficits Psych: Normal affect and mood  ASSESSMENT/PLAN:   Abdominal pain L sided abdominal pain. Per pt, feels like previous diverticulitis. Diverticulosis noted on CT abdomen 03/2023 and colonoscopy in 2021 Reassuringly, she is hemodynamically stable, overall well appearing. No vomiting and abdomen is  soft and less concerning for bowel obstruction. Pain is out of proportion to exam which could be concerning for ischemia but no blood in stool.  Shared decision making used regarding outpatient workup versus ED visit.  Patient prefers to go to ED for  further immediate evaluation and pain control. - Patient plans to go now to ED, feels comfortable driving herself which is appropriate - Provide information for GI follow-up.   Essential hypertension Elevated blood pressure likely due to acute pain. -  f/u BP at upcoming apt w/ Dr. Raymondo Band on 4/1  T2DM (type 2 diabetes mellitus) (HCC) Diabetes well-managed with hemoglobin A1c of 5.5. Missed last appointment with Dr. Raymondo Band. - f/u apt w/ Dr. Raymondo Band 4/1    General Health Maintenance COVID vaccine today   Dr. Erick Alley, DO [facility]

## 2023-11-16 NOTE — Patient Instructions (Addendum)
 It was great to see you! Thank you for allowing me to participate in your care!  I recommend that you always bring your medications to each appointment as this makes it easy to ensure you are on the correct medications and helps [place] not miss when refills are needed.  Our plans for today:  - You may have diverticulitis. Since you are in so much pain, please head to the ED to imaging and blood work today.  - Return for a visit with Dr. Raymondo Band ASAP - schedule an appointment with [facility]: [facility] [address] [place] [address] Floor  [phone]   Take care and seek immediate care sooner if you develop any concerns.   Dr. Erick Alley, DO [facility]

## 2023-11-17 NOTE — Assessment & Plan Note (Signed)
 Elevated blood pressure likely due to acute pain. -  f/u BP at upcoming apt w/ Dr. Raymondo Band on 4/1

## 2023-11-17 NOTE — Assessment & Plan Note (Signed)
 L sided abdominal pain. Per pt, feels like previous diverticulitis. Diverticulosis noted on CT abdomen 03/2023 and colonoscopy in 2021 Reassuringly, she is hemodynamically stable, overall well appearing. No vomiting and abdomen is  soft and less concerning for bowel obstruction. Pain is out of proportion to exam which could be concerning for ischemia but no blood in stool.  Shared decision making used regarding outpatient workup versus ED visit.  Patient prefers to go to ED for further immediate evaluation and pain control. - Patient plans to go now to ED, feels comfortable driving herself which is appropriate - Provide information for GI follow-up.

## 2023-11-17 NOTE — Assessment & Plan Note (Signed)
 Diabetes well-managed with hemoglobin A1c of 5.5. Missed last appointment with Dr. Raymondo Band. - f/u apt w/ Dr. Raymondo Band 4/1

## 2023-11-23 ENCOUNTER — Ambulatory Visit: Admitting: Pharmacist

## 2023-11-30 ENCOUNTER — Ambulatory Visit (INDEPENDENT_AMBULATORY_CARE_PROVIDER_SITE_OTHER): Admitting: Student

## 2023-11-30 VITALS — BP 152/77 | HR 85 | Ht 67.0 in | Wt 202.0 lb

## 2023-11-30 DIAGNOSIS — Z7985 Long-term (current) use of injectable non-insulin antidiabetic drugs: Secondary | ICD-10-CM | POA: Diagnosis not present

## 2023-11-30 DIAGNOSIS — M792 Neuralgia and neuritis, unspecified: Secondary | ICD-10-CM

## 2023-11-30 DIAGNOSIS — E1142 Type 2 diabetes mellitus with diabetic polyneuropathy: Secondary | ICD-10-CM

## 2023-11-30 DIAGNOSIS — R1032 Left lower quadrant pain: Secondary | ICD-10-CM

## 2023-11-30 MED ORDER — GABAPENTIN 300 MG PO CAPS: 300.0000 mg | ORAL_CAPSULE | Freq: Three times a day (TID) | ORAL | 11 refills | Status: DC

## 2023-11-30 MED ORDER — CAPSAICIN 0.025 % EX LOTN
TOPICAL_LOTION | CUTANEOUS | 2 refills | Status: DC
Start: 2023-11-30 — End: 2024-06-01

## 2023-11-30 NOTE — Patient Instructions (Addendum)
 Pleasure to meet you today.  The right foot pain is more consistent with worsening of your neuropathy.  I recommend going up on your gabapentin 300 3 times a day.  Also continue to use the Voltaren gel.  I am also sending prescription for [place] cream which you can apply at least 3 times daily as needed for the pain.  I ordered some lab to check your vitamin B-12 levels, HIV and syphilis.

## 2023-11-30 NOTE — Progress Notes (Signed)
    SUBJECTIVE:   CHIEF COMPLAINT / HPI:   58 year old female with history of type 2 diabetes diabetic neuropathy presents with severe right foot pain, described as a burning sensation, that radiates up the leg. The pain is constant throughout the day and worsens at night, particularly when the patient is trying to relax. The pain is so severe that it wakes the patient up at night. The patient reports that the pain is sensitive to touch and feels like heat is being applied to the area. The patient has a history of neuropathy in the feet, but this pain is different and more severe. She is currently taking gabapentin 100 mg in the morning and afternoon and 200-300 in the evening but reports that these medications are not providing any relief. The patient has tried applying Voltaren gel to the area, which provides temporary relief for about 10-15 minutes before the pain returns.   PERTINENT  PMH / PSH: Reviewed  OBJECTIVE:   BP (!) 152/77   Pulse 85   Ht 5\' 7"  (1.702 m)   Wt 202 lb (91.6 kg)   SpO2 100%   BMI 31.64 kg/m   Physical Exam General: Alert, well appearing, NAD Cardiovascular: RRR, No Murmurs, Normal S2/S2 Respiratory: CTAB, No wheezing or Rales Left foot: No tenderness, NV intact Right foot: No notable deformity or skin changes, very tender to light touch. Had sensation to light touch on all parts of the foot except the 5th toe   ASSESSMENT/PLAN:   Neuropathic pain of foot Suspect worsening neuropathy given severe pain and sensitivity.  Poorly controlled on current treatment regimen and with gabapentin.  Last A1c was pretty reassuring at 5.5%.  Consider other possible etiologies such as HIV, syphilis or B12 deficiency.  Exam was notable for severe tenderness with light touch and loss of sensation on the right fifth toe. - Increased gabapentin to 300 mg 3 times daily - Rx capsaicin cream 3 times daily as needed -Recommend continue supplementation with Voltaren gel given mild  relief -Ordered lab for HIV, RPR and B12     Jerre Simon, MD [facility]

## 2023-11-30 NOTE — Assessment & Plan Note (Addendum)
 Suspect worsening neuropathy given severe pain and sensitivity.  Poorly controlled on current treatment regimen and with gabapentin.  Last A1c was pretty reassuring at 5.5%.  Consider other possible etiologies such as HIV, syphilis or B12 deficiency.  Exam was notable for severe tenderness with light touch and loss of sensation on the right fifth toe. - Increased gabapentin to 300 mg 3 times daily - Rx capsaicin cream 3 times daily as needed -Recommend continue supplementation with Voltaren gel given mild relief -Ordered lab for HIV, RPR and B12

## 2023-12-02 ENCOUNTER — Encounter: Payer: Self-pay | Admitting: Student

## 2023-12-02 LAB — HIV ANTIBODY (ROUTINE TESTING W REFLEX)

## 2023-12-02 LAB — VITAMIN B12: Vitamin B-12: 338 pg/mL (ref 232–1245)

## 2023-12-02 LAB — RPR: RPR Ser Ql: NONREACTIVE

## 2023-12-09 ENCOUNTER — Telehealth: Payer: Self-pay | Admitting: Pharmacist

## 2023-12-09 NOTE — Telephone Encounter (Signed)
 Patient contacted for follow-up of missed appointment for diabetes follow-up. Patient rescheduled for 4/22.  Since last contact patient reports progress in weight loss.  Current DM Medications include: Mounjaro 10 mg weekly,   Medication Plan: -no change    Total time with patient call and documentation of interaction: 4 minutes.

## 2023-12-14 ENCOUNTER — Ambulatory Visit: Admitting: Pharmacist

## 2023-12-14 ENCOUNTER — Encounter: Payer: Self-pay | Admitting: Pharmacist

## 2023-12-14 VITALS — BP 133/83 | HR 80 | Wt 202.4 lb

## 2023-12-14 DIAGNOSIS — E1142 Type 2 diabetes mellitus with diabetic polyneuropathy: Secondary | ICD-10-CM | POA: Diagnosis not present

## 2023-12-14 DIAGNOSIS — E11620 Type 2 diabetes mellitus with diabetic dermatitis: Secondary | ICD-10-CM

## 2023-12-14 DIAGNOSIS — Z794 Long term (current) use of insulin: Secondary | ICD-10-CM

## 2023-12-14 DIAGNOSIS — I10 Essential (primary) hypertension: Secondary | ICD-10-CM | POA: Diagnosis not present

## 2023-12-14 MED ORDER — MOUNJARO 12.5 MG/0.5ML ~~LOC~~ SOAJ: 12.5000 mg | SUBCUTANEOUS | 3 refills | Status: DC

## 2023-12-14 NOTE — Patient Instructions (Signed)
 It was nice to see you today!  Your goal blood sugar is 80-130 before eating and less than 180 after eating.  Medication Changes: Restart olmesartan  (Benicar ) - 1 tablet once daily  Restart rosuvastatin  (Crestor ) - 1 tablet once daily  Increase Mounjaro from 10 mg to 12.5 mg weekly  Continue all other medication the same.   Monitor blood sugars at home and keep a log (glucometer or piece of paper) to bring with you to your next visit.  Keep up the good work with diet and exercise. Aim for a diet full of vegetables, fruit and lean meats (chicken, [place], fish). Try to limit salt intake by eating fresh or frozen vegetables (instead of canned), rinse canned vegetables prior to cooking and do not add any additional salt to meals.

## 2023-12-14 NOTE — Assessment & Plan Note (Signed)
 Diabetes longstanding 14 years (2011) currently controlled, although patient reported worsening neuropathy at last visit (4/8), currently controlled with gabapentin , capsaicin  cream, and Voltaren  (diclofenac ) gel. Patient is able to verbalize appropriate hypoglycemia management plan. Medication adherence appears good in regards to current DM medication regimen.  -Discussed weight goal by end of 2025: 195 lb (<200) -Increased dose of GLP-1 Mounjaro (tirzepatide) from 10 mg to 12.5 mg weekly.  -Patient educated on purpose, proper use, and potential adverse effects.  -Extensively discussed pathophysiology of diabetes, recommended lifestyle interventions, dietary effects on blood sugar control.

## 2023-12-14 NOTE — Progress Notes (Signed)
 S:     Chief Complaint  Patient presents with   Medication Management    Diabetes follow-up   58 y.o. female who presents for diabetes evaluation, education, and management. Patient arrives in good spirits and presents without any assistance.   Patient was referred and last seen by Primary Care Provider, Dr. Mikeal Alder, on 11/30/23.   PMH is significant for T2DM, neuropathy, HTN, HLD .  At last visit, patient reported worsening neuropathy of foot and increased gabapentin  to 300 mg TID, started capsaicin  cream TID, and continued Voltaren  (diclofenac ) gel.  Patient reports Diabetes was diagnosed in 2011.   Current diabetes medications include: Mounjaro (tirzepatide) 10 mg once weekly Current hypertension medications include: hydrochlorothiazide  12.5 mg, metoprolol  succinate 50 mg once daily, olmesartan  40 mg once daily Current hyperlipidemia medications include: rosuvastatin  10 mg once daily  Patient denies adherence with medications, reports not taking most medications, including hypertension and hyperlipidemia medications. Reports adherence to Mounjaro, gabapentin , capsaicin  cream, and Voltaren  (diclofenac ) gel.  Do you feel that your medications are working for you? yes Have you been experiencing any side effects to the medications prescribed? no Do you have any problems obtaining medications due to transportation or finances? yes, unable to obtain Libre3 sensors due to cost Insurance coverage: [employer]  Denies taking blood sugar levels at home  Reported at-home blood pressure levels: 140-150s/70-80s  Patient-reported exercise habits: walking, at-home workout 2x daily  O:   Review of Systems  All other systems reviewed and are negative.   Physical Exam Vitals reviewed.  Constitutional:      Appearance: Normal appearance.  Pulmonary:     Effort: Pulmonary effort is normal.  Neurological:     Mental Status: She is alert.  Psychiatric:        Behavior:  Behavior normal.        Thought Content: Thought content normal.        Judgment: Judgment normal.     Lab Results  Component Value Date   HGBA1C 5.5 11/16/2023   Vitals:   12/14/23 1027  BP: 119/76  Pulse: 80  SpO2: 100%    Lipid Panel     Component Value Date/Time   CHOL 169 04/13/2023 1220   TRIG 140 04/13/2023 1220   HDL 40 04/13/2023 1220   CHOLHDL 4.2 04/13/2023 1220   CHOLHDL 5 11/17/2021 1212   VLDL 45.0 (H) 11/17/2021 1212   LDLCALC 104 (H) 04/13/2023 1220   LDLDIRECT 117.0 11/17/2021 1212    Clinical Atherosclerotic Cardiovascular Disease (ASCVD): No  The 10-year ASCVD risk score (Arnett DK, et al., 2019) is: 10.9%   Values used to calculate the score:     Age: 54 years     Sex: Female     Is Non-Hispanic African American: Yes     Diabetic: Yes     Tobacco smoker: No     Systolic Blood Pressure: 119 mmHg     Is BP treated: Yes     HDL Cholesterol: 40 mg/dL     Total Cholesterol: 169 mg/dL   A/P: Diabetes longstanding 14 years (2011) currently controlled, although patient reported worsening neuropathy at last visit (4/8), currently controlled with gabapentin , capsaicin  cream, and Voltaren  (diclofenac ) gel. Patient is able to verbalize appropriate hypoglycemia management plan. Medication adherence appears good in regards to current DM medication regimen.  -Discussed weight goal by end of 2025: 195 lb (<200) -Increased dose of GLP-1 Mounjaro (tirzepatide) from 10 mg to 12.5 mg weekly.  -  Patient educated on purpose, proper use, and potential adverse effects.  -Extensively discussed pathophysiology of diabetes, recommended lifestyle interventions, dietary effects on blood sugar control.  -Counseled on s/sx of and management of hypoglycemia.   ASCVD risk - primary prevention in patient with diabetes. Last LDL is 104 not at goal of <16 mg/dL. ASCVD risk factors include T2DM, HTN, HLD and 10-year ASCVD risk score of 10.9%. Moderate to high intensity statin  indicated.  -Restarted rosuvastatin  10 mg - encouraged adherence  Hypertension longstanding currently controlled in-office, but remains uncontrolled at-home with average readings of 140-150s/70-80s. Suspected masked hypertension. Blood pressure goal of <130/80 mmHg. Medication adherence poor. Blood pressure control is suboptimal due to poor adherence to BP medication regimen and stressors at home. -Restarted olmesartan  40 mg - encouraged adherence  Written patient instructions provided. Patient verbalized understanding of treatment plan.  Total time in face to face counseling 27 minutes.    Follow-up:  Pharmacist PRN PCP clinic visit in 01/10/24 Patient seen with Teofilo Fellers, PharmD Candidate.

## 2023-12-14 NOTE — Assessment & Plan Note (Signed)
 Hypertension longstanding currently controlled in-office, but remains uncontrolled at-home with average readings of 140-150s/70-80s. Suspected masked hypertension. Blood pressure goal of <130/80 mmHg. Medication adherence poor. Blood pressure control is suboptimal due to poor adherence to BP medication regimen and stressors at home. -Restarted olmesartan  40 mg - encouraged adherence

## 2023-12-16 NOTE — Progress Notes (Signed)
 Reviewed and agree with Dr Macky Lower plan.

## 2023-12-17 ENCOUNTER — Other Ambulatory Visit: Payer: Self-pay | Admitting: *Deleted

## 2023-12-17 DIAGNOSIS — I10 Essential (primary) hypertension: Secondary | ICD-10-CM

## 2023-12-17 DIAGNOSIS — Z794 Long term (current) use of insulin: Secondary | ICD-10-CM

## 2023-12-17 MED ORDER — OLMESARTAN MEDOXOMIL 40 MG PO TABS
40.0000 mg | ORAL_TABLET | Freq: Every day | ORAL | 11 refills | Status: DC
Start: 2023-12-17 — End: 2024-01-10

## 2023-12-17 MED ORDER — ROSUVASTATIN CALCIUM 10 MG PO TABS: 10.0000 mg | ORAL_TABLET | Freq: Every day | ORAL | 3 refills | Status: AC

## 2023-12-24 ENCOUNTER — Ambulatory Visit (INDEPENDENT_AMBULATORY_CARE_PROVIDER_SITE_OTHER): Admitting: Family Medicine

## 2023-12-24 ENCOUNTER — Encounter: Payer: Self-pay | Admitting: Family Medicine

## 2023-12-24 ENCOUNTER — Ambulatory Visit: Attending: Family Medicine

## 2023-12-24 VITALS — BP 106/68 | HR 96 | Ht 67.0 in | Wt 199.2 lb

## 2023-12-24 DIAGNOSIS — G459 Transient cerebral ischemic attack, unspecified: Secondary | ICD-10-CM

## 2023-12-24 MED ORDER — ASPIRIN 81 MG PO TBEC
81.0000 mg | DELAYED_RELEASE_TABLET | Freq: Every day | ORAL | 12 refills | Status: DC
Start: 2023-12-24 — End: 2024-06-01

## 2023-12-24 NOTE — Progress Notes (Signed)
    SUBJECTIVE:   CHIEF COMPLAINT / HPI:    Pt reports that on Wednesday 4/30 around 3:00-5:00 PM she was getting out of her car and noticed a burning sensation/pain that radiated from her feet up her legs bilaterally.  Shortly afterwards also noted blurry vision and weakness of her left arm.  Also had some pain due to her diabetic neuropathy.  She went to sleep and when she woke up her symptoms had largely improved.  She still reports some mild weakness in her left arm but it has pretty much normalized.  Today denies much weakness or numbness in her legs/arms.  Denies blurry vision or blindness, headache.  At no point did she have any difficulty speaking or slurred speech, and was not told that she had any facial droop.  Sensation feels normal in her arms and legs.  Had an MRI in 2023 while she has been worked up for trigeminal neuralgia.  This showed mild age-related early chronic small vessel ischemia but otherwise unremarkable  PERTINENT  PMH / PSH: HTN, T2DM, diabetic polyneuropathy on gabapentin ,  OBJECTIVE:   BP 106/68   Pulse 96   Ht 5\' 7"  (1.702 m)   Wt 199 lb 4 oz (90.4 kg)   SpO2 100%   BMI 31.21 kg/m   General: NAD, pleasant, able to participate in exam Cardiac: RRR, no murmurs auscultated Respiratory: CTAB, normal WOB Abdomen: soft, non-tender, non-distended, normoactive bowel sounds Extremities: warm and well perfused, no edema or cyanosis Skin: warm and dry, no rashes noted Neuro: alert, no obvious focal deficits, speech normal.  Cranial nerves II through XII intact without any focal deficits.  Sensation intact bilateral arms and legs.  Strength 5/5 bilateral upper and lower extremities.  Finger-nose and heel/shin normal.  Gait normal. Psych: Normal affect and mood  ASSESSMENT/PLAN:   Assessment & Plan TIA (transient ischemic attack) Symptoms are most concerning for TIA.  Initiate workup as below EKG in clinic today shows NSR without any ischemic  changes Initiate aspirin  81 mg daily MRI brain without contrast CTA head/neck with and without contrast Echo 14-day ZIO monitor Lipid panel, CMP, CBC, TSH Follow-up results as available, discussed strict ED precautions   Edison Gore, MD [facility]

## 2023-12-24 NOTE — Patient Instructions (Signed)
 Start taking aspirin  daily  I will be in touch with your results as soon as I have them  Please visit the ED if you have return of your symptoms including arm/leg numbness or weakness, blurry vision or blindness, difficulty speaking. See attached handout for more info

## 2023-12-24 NOTE — Progress Notes (Unsigned)
 EP to read.

## 2023-12-25 ENCOUNTER — Encounter: Payer: Self-pay | Admitting: Family Medicine

## 2023-12-25 LAB — CBC WITH DIFFERENTIAL/PLATELET
Basophils Absolute: 0.1 10*3/uL (ref 0.0–0.2)
Basos: 1 %
EOS (ABSOLUTE): 0 10*3/uL (ref 0.0–0.4)
Eos: 0 %
Hematocrit: 39.6 % (ref 34.0–46.6)
Hemoglobin: 13.4 g/dL (ref 11.1–15.9)
Immature Grans (Abs): 0 10*3/uL (ref 0.0–0.1)
Immature Granulocytes: 0 %
Lymphocytes Absolute: 4.9 10*3/uL — ABNORMAL HIGH (ref 0.7–3.1)
Lymphs: 48 %
MCH: 30.3 pg (ref 26.6–33.0)
MCHC: 33.8 g/dL (ref 31.5–35.7)
MCV: 90 fL (ref 79–97)
Monocytes Absolute: 0.8 10*3/uL (ref 0.1–0.9)
Monocytes: 8 %
Neutrophils Absolute: 4.3 10*3/uL (ref 1.4–7.0)
Neutrophils: 43 %
Platelets: 429 10*3/uL (ref 150–450)
RBC: 4.42 x10E6/uL (ref 3.77–5.28)
RDW: 12.4 % (ref 11.7–15.4)
WBC: 10.1 10*3/uL (ref 3.4–10.8)

## 2023-12-25 LAB — COMPREHENSIVE METABOLIC PANEL WITH GFR
ALT: 16 IU/L (ref 0–32)
AST: 17 IU/L (ref 0–40)
Albumin: 4.6 g/dL (ref 3.8–4.9)
Alkaline Phosphatase: 126 IU/L — ABNORMAL HIGH (ref 44–121)
BUN/Creatinine Ratio: 14 (ref 9–23)
BUN: 15 mg/dL (ref 6–24)
Bilirubin Total: 0.3 mg/dL (ref 0.0–1.2)
CO2: 24 mmol/L (ref 20–29)
Calcium: 9.8 mg/dL (ref 8.7–10.2)
Chloride: 101 mmol/L (ref 96–106)
Creatinine, Ser: 1.08 mg/dL — ABNORMAL HIGH (ref 0.57–1.00)
Globulin, Total: 2.7 g/dL (ref 1.5–4.5)
Glucose: 73 mg/dL (ref 70–99)
Potassium: 4.5 mmol/L (ref 3.5–5.2)
Sodium: 138 mmol/L (ref 134–144)
Total Protein: 7.3 g/dL (ref 6.0–8.5)
eGFR: 60 mL/min/{1.73_m2} (ref >59)

## 2023-12-25 LAB — TSH RFX ON ABNORMAL TO FREE T4: TSH: 1.6 u[IU]/mL (ref 0.450–4.500)

## 2023-12-25 LAB — LIPID PANEL
Chol/HDL Ratio: 2.8 ratio (ref 0.0–4.4)
Cholesterol, Total: 110 mg/dL (ref 100–199)
HDL: 40 mg/dL (ref >39)
LDL Chol Calc (NIH): 53 mg/dL (ref 0–99)
Triglycerides: 88 mg/dL (ref 0–149)
VLDL Cholesterol Cal: 17 mg/dL (ref 5–40)

## 2023-12-30 ENCOUNTER — Ambulatory Visit (HOSPITAL_COMMUNITY)
Admission: RE | Admit: 2023-12-30 | Discharge: 2023-12-30 | Disposition: A | Discharge status: Home / Self Care | Source: Ambulatory Visit | Attending: Family Medicine | Admitting: Family Medicine

## 2023-12-30 ENCOUNTER — Encounter: Payer: Self-pay | Admitting: Family Medicine

## 2023-12-30 DIAGNOSIS — G459 Transient cerebral ischemic attack, unspecified: Secondary | ICD-10-CM | POA: Diagnosis present

## 2024-01-09 NOTE — Progress Notes (Signed)
  SUBJECTIVE:   CHIEF COMPLAINT / HPI:   Heart monitor & HTN  Heart Monitor Placed on Zio patch today   HTN / Hypotension Meds: Olmesartan  40 mg Today: notes felt fine this morning, 106/74 at 7:30 am,  123/90 after gym workout, hour before coming, BP was 64/37 and is feeling dizzy. Notes she sweat a lot with her work out, but drinks plenty of water a day.   PERTINENT  PMH / PSH:   OBJECTIVE:  BP (!) 84/60   Pulse 99   Ht 5\' 7"  (1.702 m)   Wt 197 lb 3.2 oz (89.4 kg)   SpO2 99%   BMI 30.89 kg/m  Physical Exam Constitutional:      General: She is not in acute distress.    Appearance: Normal appearance. She is not ill-appearing.  Cardiovascular:     Rate and Rhythm: Normal rate and regular rhythm.     Pulses: Normal pulses.     Heart sounds: Normal heart sounds. No murmur heard.    No friction rub. No gallop.  Pulmonary:     Effort: Pulmonary effort is normal. No respiratory distress.     Breath sounds: Normal breath sounds. No stridor. No wheezing, rhonchi or rales.  Neurological:     Mental Status: She is alert.      ASSESSMENT/PLAN:   Assessment & Plan Essential hypertension Patient comes in for follow-up of her blood pressure.  Patient's blood pressure hypotensive today.  Patient had normal orthostatic vitals, but nearly positive.  Patient on 40 mg of olmesartan , will reduce dose by half, and send patient for ambulatory blood pressure monitoring. - Olmesartan  20 mg daily - Ambulatory blood pressure monitoring with Dr. Peter Koval -Encourage hydration with fluids - ED precautions given for syncope, presyncope, weakness, fatigue. No follow-ups on file. Wilhemena Harbour, MD 01/10/2024, 3:05 PM PGY-3, [facility]

## 2024-01-09 NOTE — Patient Instructions (Signed)
 It was great to see you! Thank you for allowing me to participate in your care!  I recommend that you always bring your medications to each appointment as this makes it easy to ensure we are on the correct medications and helps us  not miss when refills are needed.  Our plans for today:  - High Blood Pressure  Continue Olmesartan  20 mg (Half of the 40 mg you were previously on) Schedule appointment with Dr. Cristal Don, here at this clinic, for Ambulatory Blood Pressure Monitoring.  - Zio Patch Placed Zio patch today, where for 2 weeks and then send in via mail.  May not get a good read, as the sensor is orange instead of green   Take care and seek immediate care sooner if you develop any concerns.   Dr. Wilhemena Harbour, MD [facility]

## 2024-01-10 ENCOUNTER — Ambulatory Visit: Admitting: Student

## 2024-01-10 ENCOUNTER — Encounter: Payer: Self-pay | Admitting: Student

## 2024-01-10 DIAGNOSIS — I10 Essential (primary) hypertension: Secondary | ICD-10-CM

## 2024-01-10 MED ORDER — OLMESARTAN MEDOXOMIL 20 MG PO TABS
40.0000 mg | ORAL_TABLET | Freq: Every day | ORAL | 2 refills | Status: DC
Start: 2024-01-10 — End: 2024-01-20

## 2024-01-10 NOTE — Assessment & Plan Note (Addendum)
 Patient comes in for follow-up of her blood pressure.  Patient's blood pressure hypotensive today.  Patient had normal orthostatic vitals, but nearly positive.  Patient on 40 mg of olmesartan , will reduce dose by half, and send patient for ambulatory blood pressure monitoring. - Olmesartan  20 mg daily - Ambulatory blood pressure monitoring with Dr. Peter Koval -Encourage hydration with fluids - ED precautions given for syncope, presyncope, weakness, fatigue.

## 2024-01-14 ENCOUNTER — Other Ambulatory Visit: Payer: Self-pay

## 2024-01-14 DIAGNOSIS — I10 Essential (primary) hypertension: Secondary | ICD-10-CM

## 2024-01-14 MED ORDER — HYDROCHLOROTHIAZIDE 12.5 MG PO TABS
12.5000 mg | ORAL_TABLET | Freq: Every day | ORAL | 3 refills | Status: DC
Start: 2024-01-14 — End: 2024-01-25

## 2024-01-20 ENCOUNTER — Other Ambulatory Visit: Payer: Self-pay | Admitting: Student

## 2024-01-20 DIAGNOSIS — I10 Essential (primary) hypertension: Secondary | ICD-10-CM

## 2024-01-24 ENCOUNTER — Ambulatory Visit (INDEPENDENT_AMBULATORY_CARE_PROVIDER_SITE_OTHER): Admitting: Pharmacist

## 2024-01-24 ENCOUNTER — Encounter: Payer: Self-pay | Admitting: Pharmacist

## 2024-01-24 VITALS — BP 124/87 | Wt 199.0 lb

## 2024-01-24 DIAGNOSIS — I10 Essential (primary) hypertension: Secondary | ICD-10-CM

## 2024-01-24 DIAGNOSIS — M792 Neuralgia and neuritis, unspecified: Secondary | ICD-10-CM

## 2024-01-24 MED ORDER — GABAPENTIN 300 MG PO CAPS
ORAL_CAPSULE | ORAL | 3 refills | Status: AC
Start: 2024-01-24 — End: ?

## 2024-01-24 NOTE — Progress Notes (Signed)
 S:     Chief Complaint  Patient presents with   Medication Management    Amb BP - Day #1   58 y.o. female who presents for hypertension evaluation, education, and management. Patient arrives in  good spirits and presents without any assistance.   Patient was referred and last seen by Primary Care Provider, Dr. Tenna Fees, on 01/10/2024.  At last visit, patient was referred for Amb BP monitoring.   PMH is significant for Neuropathic pain, Diabetes and Hyperlipidemia.   Diagnosed with Hypertension in the year of 2011.    Medication compliance is reported to be good.  Discussed procedure for wearing the monitor and gave patient written instructions. Monitor was placed on non-dominant arm with instructions to return in the morning.   Current BP Medications include:  hydrochlorothiazide  12.5mg  once daily, olmesartan  20mg  once daily  Excercise habits include: going to the gym ~ 3 days per week with daughter  O:  Review of Systems  Neurological:  Positive for dizziness and sensory change (leg pain).  All other systems reviewed and are negative.   Physical Exam Vitals reviewed.  Constitutional:      Appearance: Normal appearance.  Pulmonary:     Effort: Pulmonary effort is normal.  Neurological:     Mental Status: She is alert.  Psychiatric:        Mood and Affect: Mood normal.        Behavior: Behavior normal.        Thought Content: Thought content normal.        Judgment: Judgment normal.    Last 3 Office BP readings: BP Readings from Last 3 Encounters:  01/24/24 124/87  01/10/24 (!) 84/60  12/24/23 106/68    Clinical Atherosclerotic Cardiovascular Disease (ASCVD): No  The ASCVD Risk score (Arnett DK, et al., 2019) failed to calculate for the following reasons:   The valid total cholesterol range is 130 to 320 mg/dL  Basic Metabolic Panel    Component Value Date/Time   NA 138 12/24/2023 1238   K 4.5 12/24/2023 1238   CL 101 12/24/2023 1238   CO2 24 12/24/2023  1238   GLUCOSE 73 12/24/2023 1238   GLUCOSE 103 (H) 02/13/2022 1742   GLUCOSE 101 06/21/2014 1113   BUN 15 12/24/2023 1238   CREATININE 1.08 (H) 12/24/2023 1238   CREATININE 0.73 01/04/2014 1701   CALCIUM  9.8 12/24/2023 1238   GFRNONAA >60 02/13/2022 1742   GFRNONAA >89 01/04/2014 1701   GFRAA 118 02/21/2020 1413   GFRAA >89 01/04/2014 1701    Renal function: CrCl cannot be calculated (Patient's most recent lab result is older than the maximum 21 days allowed.).   ABPM Study Data: Arm Placement right arm   For Office Goal BP of <130/80 mmHg:  ABPM thresholds: Overall BP <125/75 mmHg, daytime BP <130/80 mmHg, sleeptime BP <110/65 mmHg   A/P: History of hypertension since 2011, currently taking olmesartan  and hydrochlorothiazide . Goal presssure of <130/80 mm Hg.     -Placed blood pressure cuff, provided education, patient instructed to wear cuff for 24 hours and return tomorrow to review results.  Neuropathic pain - remains poorly controlled.  Patient has tried a variety of options however continues to have pain in both legs with left>right.   Currently taking gabapenting 300mg  TID. -Increased dose of Gabapentin  from 300mg  TID to 300mg  BID during the day and 600mg  at bedtime.    Written patient instructions provided including activity/symptom/event log. Patient verbalized understanding of plan. Total time in  face to face counseling 24 minutes.    Follow-up: Tomorrow AM - early morning appointment 9:00AM

## 2024-01-24 NOTE — Patient Instructions (Signed)
 Blood Pressure Activity Diary Time Lying down/ Sleeping Walking/ Exercise Stressed/ Angry Headache/ Pain Dizzy  9 AM       10 AM       11 AM       12 PM       1 PM       2 PM       Time Lying down/ Sleeping Walking/ Exercise Stressed/ Angry Headache/ Pain Dizzy  3 PM       4 PM        5 PM       6 PM       7 PM       8 PM       Time Lying down/ Sleeping Walking/ Exercise Stressed/ Angry Headache/ Pain Dizzy  9 PM       10 PM       11 PM       12 AM       1 AM       2 AM       3 AM       Time Lying down/ Sleeping Walking/ Exercise Stressed/ Angry Headache/ Pain Dizzy  4 AM       5 AM       6 AM       7 AM       8 AM       9 AM       10 AM        Time you woke up: _________                  Time you went to sleep:__________  Come back tomorrow at 9:00 to have the monitor removed  Call the [facility] if you have any questions before then ([phone])  Wearing the Blood Pressure Monitor The cuff will inflate every 20 minutes during the day and every 30 minutes while you sleep. Fill out the blood pressure-activity diary during the day, especially during activities that may affect your reading -- such as exercise, stress, walking, taking your blood pressure medications  Important things to know: Avoid taking the monitor off for the next 24 hours, unless it causes you discomfort or pain. Do NOT get the monitor wet and do NOT try to clean the monitor with any cleaning products. Do NOT put the monitor on anyone else's arm. When the cuff inflates, avoid excess movement. Let the cuffed arm hang loosely, slightly away from the body. Avoid flexing the muscles or moving the hand/fingers. Remember to fill out the blood pressure activity diary. If you experience severe pain or unusual pain (not associated with getting your blood pressure checked), remove the monitor.  Troubleshooting:  Code  Troubleshooting   1  Check cuff position, tighten cuff   2, 3  Remain still  during reading   4, 87  Check air hose connections and make sure cuff is tight   85, 89  Check hose connections and make tubing is not crimped   86  Push START/STOP to restart reading   88, 91  Retry by pushing START/STOP   90  Replace batteries. If problem persists, remove monitor and bring back to   clinic at follow up   97, 98, 99  Service required - Remove monitor and bring back to clinic at follow up

## 2024-01-24 NOTE — Assessment & Plan Note (Signed)
 History of hypertension since 2011, currently taking olmesartan  and hydrochlorothiazide . Goal presssure of <130/80 mm Hg.     -Placed blood pressure cuff, provided education, patient instructed to wear cuff for 24 hours and return tomorrow to review results.

## 2024-01-24 NOTE — Assessment & Plan Note (Signed)
 Neuropathic pain - remains poorly controlled.  Patient has tried a variety of options however continues to have pain in both legs with left>right.   Currently taking gabapenting 300mg  TID. -Increased dose of Gabapentin  from 300mg  TID to 300mg  BID during the day and 600mg  at bedtime.

## 2024-01-25 ENCOUNTER — Encounter: Payer: Self-pay | Admitting: Pharmacist

## 2024-01-25 ENCOUNTER — Ambulatory Visit (INDEPENDENT_AMBULATORY_CARE_PROVIDER_SITE_OTHER): Admitting: Pharmacist

## 2024-01-25 VITALS — BP 115/72 | HR 89

## 2024-01-25 DIAGNOSIS — I10 Essential (primary) hypertension: Secondary | ICD-10-CM

## 2024-01-25 MED ORDER — OLMESARTAN MEDOXOMIL-HCTZ 20-12.5 MG PO TABS: 1.0000 | ORAL_TABLET | Freq: Every day | ORAL | 3 refills | Status: DC

## 2024-01-25 NOTE — Assessment & Plan Note (Signed)
 History of hypertension since 2011, currently taking two drug regimen of olmesartan  and hydrochlorothiazide  with goal presssure of <130/80 mm Hg. Found to have normal blood pressure with 24-hour ambulatory blood pressure evaluation which demonstrates an average AWAKE blood pressure of 115/72 mmHg. Nocturnal dipping pattern is normal.   Changes to medications -Switched from individual agents to combination tablet.  Instructed to take one of each of your current supply until one of those is finished THEN CHANGE blood pressure medication to the combination pill (Olmesartan  20mg  / hydrochlorothiazide  12.5mg ) one tablet daily.

## 2024-01-25 NOTE — Patient Instructions (Signed)
 It was nice to see you today!  Thank you for completing the blood pressure monitoring evaluation.  Your goal blood pressure is < 130/80 mmHg   Medication Changes: CHANGE your blood pressure medication to the combination pill - 1 tablet daily.  Olmesartan  20 / hydrochlorothiazide  12.5  You can take one of each of your current supply until one of those is finished.   Continue all other medication the same.   Monitor blood pressure at home and keep a log (on a piece of paper) to bring with you to your next visit.

## 2024-01-25 NOTE — Progress Notes (Signed)
   S:     Chief Complaint  Patient presents with   Medication Management    Amb BP - Day #2   58 y.o. female who presents for hypertension evaluation, education, and management.  Patient arrives in good spirits and presents without any assistance.   Patient returns to clinic with 24 hour blood pressure monitor and reports they were able to wear the ambulatory blood pressure cuff for the entire 24 evaluation period.    O:  Review of Systems  Musculoskeletal:  Negative for joint pain (leg pain overnight improved with higher dose gabapentin ).  Neurological:  Negative for dizziness.    Physical Exam Constitutional:      Appearance: Normal appearance.  Pulmonary:     Effort: Pulmonary effort is normal.  Neurological:     Mental Status: She is alert.  Psychiatric:        Mood and Affect: Mood normal.        Behavior: Behavior normal.        Thought Content: Thought content normal.        Judgment: Judgment normal.    Last 3 Office BP readings: BP Readings from Last 3 Encounters:  01/25/24 115/72  01/24/24 124/87  01/10/24 (!) 84/60    Basic Metabolic Panel    Component Value Date/Time   NA 138 12/24/2023 1238   K 4.5 12/24/2023 1238   CL 101 12/24/2023 1238   CO2 24 12/24/2023 1238   GLUCOSE 73 12/24/2023 1238   GLUCOSE 103 (H) 02/13/2022 1742   GLUCOSE 101 06/21/2014 1113   BUN 15 12/24/2023 1238   CREATININE 1.08 (H) 12/24/2023 1238   CREATININE 0.73 01/04/2014 1701   CALCIUM  9.8 12/24/2023 1238   GFRNONAA >60 02/13/2022 1742   GFRNONAA >89 01/04/2014 1701   GFRAA 118 02/21/2020 1413   GFRAA >89 01/04/2014 1701    ABPM Study Data: Arm Placement left arm  Overall Mean 24hr BP:   109/67 mmHg  HR: 87  Daytime Mean BP:  115/72 mmHg  HR: 89  Nighttime Mean BP:  98/58 mmHg  HR: 85  Dipping Pattern: Yes.    Sys:   14%   Dia: 18%   [normal dipping ~10-20%]    For Office Goal BP of <130/80 mmHg:  ABPM thresholds: Overall BP <125/75 mmHg, daytime BP <130/80  mmHg, sleeptime BP <110/65 mmHg    A/P: History of hypertension since 2011, currently taking two drug regimen of olmesartan  and hydrochlorothiazide  with goal presssure of <130/80 mm Hg. Found to have normal blood pressure with 24-hour ambulatory blood pressure evaluation which demonstrates an average AWAKE blood pressure of 115/72 mmHg. Nocturnal dipping pattern is normal.   Changes to medications -Switched from individual agents to combination tablet.  Instructed to take one of each of your current supply until one of those is finished THEN CHANGE blood pressure medication to the combination pill (Olmesartan  20mg  / hydrochlorothiazide  12.5mg ) one tablet daily.   Results reviewed and written information provided.    Written patient instructions provided. Patient verbalized understanding of treatment plan.  Total time in face to face counseling 22 minutes.    Follow-up:  Pharmacist TBD PCP - TBD

## 2024-01-26 NOTE — Progress Notes (Signed)
 Reviewed and agree with Dr Macky Lower plan.

## 2024-02-01 ENCOUNTER — Encounter: Payer: Self-pay | Admitting: *Deleted

## 2024-02-02 ENCOUNTER — Ambulatory Visit: Payer: Self-pay | Admitting: Family Medicine

## 2024-02-02 DIAGNOSIS — G459 Transient cerebral ischemic attack, unspecified: Secondary | ICD-10-CM | POA: Diagnosis not present

## 2024-02-21 ENCOUNTER — Encounter: Payer: Self-pay | Admitting: Student

## 2024-03-31 ENCOUNTER — Ambulatory Visit (INDEPENDENT_AMBULATORY_CARE_PROVIDER_SITE_OTHER): Admitting: Family Medicine

## 2024-03-31 VITALS — BP 125/80 | HR 67 | Ht 67.0 in | Wt 192.8 lb

## 2024-03-31 DIAGNOSIS — E1142 Type 2 diabetes mellitus with diabetic polyneuropathy: Secondary | ICD-10-CM

## 2024-03-31 MED ORDER — DULOXETINE HCL 60 MG PO CPEP: 60.0000 mg | ORAL_CAPSULE | Freq: Every day | ORAL | 3 refills | Status: DC

## 2024-03-31 NOTE — Assessment & Plan Note (Signed)
 Start Cymbalta  60 mg daily Continue gabapentin  Discussed return precautions.  Could consider switch to Lyrica or nerve conduction studies eventually if symptoms do not improve

## 2024-03-31 NOTE — Patient Instructions (Signed)
 Start taking Cymbalta  daily Let us  know if this makes you too sleepy or causes any side effects Continue taking your gabapentin  as prescribed  Let us  know if you experience any worsening numbness or weakness in your arms or legs

## 2024-03-31 NOTE — Progress Notes (Signed)
    SUBJECTIVE:   CHIEF COMPLAINT / HPI:   Flare of hand and feet neuropathy, worsening over the past few weeks She has been on gabapentin  and takes 300 in the morning, 300 in the afternoon, 600 at night.  She feels that if she takes any more of this it will make her too sleepy She is interested in adding on another medication for neuropathy   PERTINENT  PMH / PSH: T2DM, diabetic polyneuropathy  OBJECTIVE:   BP 125/80   Pulse 67   Ht 5' 7 (1.702 m)   Wt 192 lb 12.8 oz (87.5 kg)   SpO2 100%   BMI 30.20 kg/m   General: NAD, pleasant, able to participate in exam Respiratory: No respiratory distress Extremities: Sensation intact in fingers and toes.  Normal cap refill Skin: warm and dry, no rashes noted Psych: Normal affect and mood  ASSESSMENT/PLAN:   Assessment & Plan Diabetic polyneuropathy associated with type 2 diabetes mellitus (HCC) Start Cymbalta  60 mg daily Continue gabapentin  Discussed return precautions.  Could consider switch to Lyrica or nerve conduction studies eventually if symptoms do not improve   Payton Coward, MD [facility]

## 2024-04-22 ENCOUNTER — Other Ambulatory Visit: Payer: Self-pay | Admitting: Family Medicine

## 2024-04-22 DIAGNOSIS — E1142 Type 2 diabetes mellitus with diabetic polyneuropathy: Secondary | ICD-10-CM

## 2024-04-26 NOTE — Telephone Encounter (Signed)
 Chart reviewed. Rx dosage refilled per original ordering provider. Changed to 90 capsules per rx per pharmacy.

## 2024-05-14 ENCOUNTER — Other Ambulatory Visit: Payer: Self-pay | Admitting: Family Medicine

## 2024-05-14 DIAGNOSIS — E1142 Type 2 diabetes mellitus with diabetic polyneuropathy: Secondary | ICD-10-CM

## 2024-05-16 ENCOUNTER — Other Ambulatory Visit: Payer: Self-pay

## 2024-05-16 DIAGNOSIS — R1032 Left lower quadrant pain: Secondary | ICD-10-CM

## 2024-05-16 MED ORDER — DICYCLOMINE HCL 10 MG PO CAPS: 20.0000 mg | ORAL_CAPSULE | Freq: Three times a day (TID) | ORAL | 1 refills | Status: DC | PRN

## 2024-05-18 ENCOUNTER — Telehealth: Payer: Self-pay

## 2024-05-18 NOTE — Telephone Encounter (Signed)
 Received call from Candace at [facility] Rx PA department regarding Mounjaro  prescription.   Mounjaro  is non-preferred with patient's plan. She is requesting that prescriber send in Wegovy , as this is preferred. This will still need a PA, however, should be approved with appropriate documentation.   Will forward to provider.   Please route to [place] once medication is sent in to assist with PA.   Chiquita JAYSON English, RN

## 2024-05-19 ENCOUNTER — Encounter: Payer: Self-pay | Admitting: Family Medicine

## 2024-05-22 ENCOUNTER — Telehealth: Payer: Self-pay | Admitting: Pharmacist

## 2024-05-22 MED ORDER — OZEMPIC (2 MG/DOSE) 8 MG/3ML ~~LOC~~ SOPN: 2.0000 mg | PEN_INJECTOR | SUBCUTANEOUS | 5 refills | Status: DC

## 2024-05-22 NOTE — Telephone Encounter (Signed)
 Patient contacted for follow-up of need for prescription change from Mounjaro  (tirzepatide ) to Ozempic  (semaglutide ).   We discussed that Medicaid may NOT cover weight loss agents in the near future and using Ozempic  (semaglutide ), as she has Dx of Diabetes would be best option.   Since last contact patient reports she has not taken any GLP for > 2 weeks.   Current Medications include: stopped Mounjaro  (tirzepatide )  Patient denies any significant medication related side effects with Mounjaro  (tirzepatide ) or previously prescribed Ozempic  (semaglutide )   Medication Plan: - Switch back to Ozempic  (semaglutide )  New prescription for 2mg  weekly which patient has tolerated in the past.  Instructed to take 40 clicks for the first two weeks. Then return to full 2mg  dose in week number 3.   Total time with patient call and documentation of interaction: 14 minutes.  F/U Appointment scheduled with PCP, Isabel Berger  06/01/2024

## 2024-05-23 ENCOUNTER — Other Ambulatory Visit (HOSPITAL_COMMUNITY): Payer: Self-pay

## 2024-05-23 ENCOUNTER — Telehealth: Payer: Self-pay

## 2024-05-23 NOTE — Telephone Encounter (Signed)
 Prior authorization submitted for OZEMPIC  to [employer] via [employer].   Key: [id number]

## 2024-05-23 NOTE — Telephone Encounter (Signed)
 Reviewed and agree with Dr Rennis plan.

## 2024-05-23 NOTE — Telephone Encounter (Signed)
 Called pharmacy with approval. Pharmacy tech reports that cost is still going to be $973.   Will forward to PCP and pharmacy team for thoughts on alternative.   Chiquita JAYSON English, RN

## 2024-05-23 NOTE — Telephone Encounter (Signed)
 Pharmacy Patient Advocate Encounter  Received notification from [facility] that Prior Authorization for OZEMPIC  2MG  DOSE PENS has been APPROVED from 05/23/24 to 05/23/25   PA #/Case ID/Reference #: [id number]

## 2024-05-24 NOTE — Telephone Encounter (Signed)
 Contacted pharmacy.   Determined that Medicaid PA required.   Patient contacted for follow-up of insurance cost.   States she is waiting at [facility] currently.  I shared status of PA required for Ozempic  (semaglutide ) with Medicaid.  (She NO LONGER HAS [employer] insurance).   Medication Samples have been provided to the patient.  Drug name: Ozempic  (semaglutide )        Strength: 1mg         Qty: 1 pen  LOT: [id number]  Exp.Date: 08/23/2026  Dosing instructions: 1 ng weekly for two weeks THEN increase to 2mg  weekly.   The patient has been instructed regarding the correct time, dose, and frequency of taking this medication, including desired effects and most common side effects.   Isabel Berger Swallows 11:04 AM 05/24/2024   Patient medication will be picked up in the next few minutes - placed at front desk for pickup     Total time with patient call and documentation of interaction: 12 minutes.

## 2024-05-24 NOTE — Telephone Encounter (Signed)
 Reviewed and agree with Dr Rennis plan.

## 2024-05-24 NOTE — Telephone Encounter (Signed)
 Prior authorization submitted for OZEMPIC  to [facility] MEDICAID via Latent.   Key: [id number]

## 2024-05-25 NOTE — Telephone Encounter (Signed)
 PA currently denied.   Faxed back addt'l info request from [facility] insurance rec'd via fax.

## 2024-05-29 ENCOUNTER — Other Ambulatory Visit (HOSPITAL_COMMUNITY): Payer: Self-pay

## 2024-05-29 NOTE — Telephone Encounter (Signed)
 Test claim shows [facility] medicaid approved PA.   Copay $4 with [employer] and [facility] medicaid.

## 2024-06-01 ENCOUNTER — Ambulatory Visit (INDEPENDENT_AMBULATORY_CARE_PROVIDER_SITE_OTHER): Admitting: Family Medicine

## 2024-06-01 ENCOUNTER — Encounter: Payer: Self-pay | Admitting: Family Medicine

## 2024-06-01 VITALS — BP 135/93 | HR 75 | Ht 67.0 in | Wt 202.6 lb

## 2024-06-01 DIAGNOSIS — E119 Type 2 diabetes mellitus without complications: Secondary | ICD-10-CM

## 2024-06-01 DIAGNOSIS — I10 Essential (primary) hypertension: Secondary | ICD-10-CM | POA: Diagnosis not present

## 2024-06-01 DIAGNOSIS — R7989 Other specified abnormal findings of blood chemistry: Secondary | ICD-10-CM

## 2024-06-01 DIAGNOSIS — Z794 Long term (current) use of insulin: Secondary | ICD-10-CM | POA: Diagnosis not present

## 2024-06-01 LAB — POCT GLYCOSYLATED HEMOGLOBIN (HGB A1C): Hemoglobin A1C: 5.4 % (ref 4.0–5.6)

## 2024-06-01 MED ORDER — OLMESARTAN MEDOXOMIL-HCTZ 20-12.5 MG PO TABS: 1.0000 | ORAL_TABLET | Freq: Every day | ORAL | 3 refills | Status: DC

## 2024-06-01 MED ORDER — OZEMPIC (2 MG/DOSE) 8 MG/3ML ~~LOC~~ SOPN: 2.0000 mg | PEN_INJECTOR | SUBCUTANEOUS | 0 refills | Status: DC

## 2024-06-01 NOTE — Assessment & Plan Note (Signed)
 A1C today at goal (5.4). Continue current regimen of ozempic  2mg  weekly.  - Urine microalbumin/Cr ratio collected today

## 2024-06-01 NOTE — Progress Notes (Signed)
    SUBJECTIVE:   CHIEF COMPLAINT / HPI:   Here for follow up on diabetes and HTN. Had 4 weeks without ozempic  due to insurance, but was able to get it and got injection yesterday. Felt like she gained some weight while being out but no other side effects. Believes she is doing well on this regimen.   Has been taking BP meds, but thinks she took the wrong one- only has olmesartan . Nees refill of benicar . Denies headaches, vision changes.   Lab Results  Component Value Date   HGBA1C 5.4 06/01/2024   HGBA1C 5.5 11/16/2023   HGBA1C 7.2 (H) 04/13/2023    PERTINENT  PMH / PSH:  - HTN DMII - Depression   OBJECTIVE:   BP (!) 135/93   Pulse 75   Ht 5' 7 (1.702 m)   Wt 202 lb 9.6 oz (91.9 kg)   SpO2 99%   BMI 31.73 kg/m   General: A&O, NAD HEENT: No sign of trauma, EOM grossly intact Cardiac: RRR, no m/r/g Respiratory: CTAB, normal WOB, no w/c/r GI: Soft, NTTP, non-distended  Extremities: NTTP, no peripheral edema. Neuro: Normal gait, moves all four extremities appropriately. Psych: Appropriate mood and affect   ASSESSMENT/PLAN:   Assessment & Plan Type 2 diabetes mellitus without complication, with long-term current use of insulin  (HCC) A1C today at goal (5.4). Continue current regimen of ozempic  2mg  weekly.  - Urine microalbumin/Cr ratio collected today  Elevated serum creatinine Previous CMP showed mild elevation of Cr to 1.08, repeated today  Essential hypertension Did not take medication this AM. Will refill Benicar  20-12.5 mg     Follow up in 1 month Gloriann Ogren, MD [facility]

## 2024-06-01 NOTE — Assessment & Plan Note (Signed)
 Did not take medication this AM. Will refill Benicar  20-12.5 mg

## 2024-06-01 NOTE — Patient Instructions (Addendum)
 It was wonderful to see you today.  Please bring ALL of your medications with you to every visit.   Today we talked about:  Your diabetes- lets continue to check your blood sugar. Lets follow up in 3 months.  Continue to take your blood pressure medication.   Thank you for choosing [facility].   Please call [phone] with any questions about today's appointment.  Please arrive at least 15 minutes prior to your scheduled appointments.   If you had blood work today, I will send you a MyChart message or a letter if results are normal. Otherwise, I will give you a call.   If you had a referral placed, they will call you to set up an appointment. Please give us  a call if you don't hear back in the next 2 weeks.   If you need additional refills before your next appointment, please call your pharmacy first.   Do you need your medications delivered to your home?   We'll send your prescription to the [facility] for delivery.          Address: [address] [place], [place], [place] [address]          Phone: [phone]  Please call the [facility] to speak with a pharmacist and set up your home medication delivery. If you have any questions, feel free to contact us  -- we're happy to help!  Other [facility] that offer affordable prices on both prescriptions and over-the-counter items, as well as convenient services like vaccinations, are  [facility], at [facility]         Address:  [address], [place], [place] [address]         Phone: [phone]  [facility], located in the [facility]        Address: [address], [place], [place] [address]        Phone: [phone]  [facility], at [facility]       Address: [address], [place], [place] [address]       Phone: [phone]  [facility], at [facility]       Address: [address], [place], [place] [address]       Phone: [phone]  You should follow up in our clinic in [place] in about 3 months (around 09/01/2024).  Gloriann Ogren, MD Family Medicine

## 2024-06-02 LAB — COMPREHENSIVE METABOLIC PANEL WITH GFR
ALT: 13 IU/L (ref 0–32)
AST: 15 IU/L (ref 0–40)
Albumin: 4.1 g/dL (ref 3.8–4.9)
Alkaline Phosphatase: 110 IU/L (ref 49–135)
BUN/Creatinine Ratio: 13 (ref 9–23)
BUN: 12 mg/dL (ref 6–24)
Bilirubin Total: 0.2 mg/dL (ref 0.0–1.2)
CO2: 22 mmol/L (ref 20–29)
Calcium: 9.5 mg/dL (ref 8.7–10.2)
Chloride: 104 mmol/L (ref 96–106)
Creatinine, Ser: 0.93 mg/dL (ref 0.57–1.00)
Globulin, Total: 2.9 g/dL (ref 1.5–4.5)
Glucose: 81 mg/dL (ref 70–99)
Potassium: 3.9 mmol/L (ref 3.5–5.2)
Sodium: 142 mmol/L (ref 134–144)
Total Protein: 7 g/dL (ref 6.0–8.5)
eGFR: 72 mL/min/1.73 (ref >59)

## 2024-06-03 LAB — MICROALBUMIN / CREATININE URINE RATIO
Creatinine, Urine: 106.9 mg/dL
Microalb/Creat Ratio: 3 mg/g{creat} (ref 0–29)
Microalbumin, Urine: 3.6 ug/mL

## 2024-06-05 ENCOUNTER — Ambulatory Visit: Payer: Self-pay | Admitting: Family Medicine

## 2024-06-08 ENCOUNTER — Ambulatory Visit: Admitting: Podiatry

## 2024-06-20 ENCOUNTER — Ambulatory Visit (INDEPENDENT_AMBULATORY_CARE_PROVIDER_SITE_OTHER): Admitting: Podiatry

## 2024-06-20 ENCOUNTER — Ambulatory Visit

## 2024-06-20 ENCOUNTER — Encounter: Payer: Self-pay | Admitting: Podiatry

## 2024-06-20 VITALS — Ht 67.0 in | Wt 202.6 lb

## 2024-06-20 DIAGNOSIS — M79672 Pain in left foot: Secondary | ICD-10-CM

## 2024-06-20 DIAGNOSIS — M19072 Primary osteoarthritis, left ankle and foot: Secondary | ICD-10-CM | POA: Diagnosis not present

## 2024-06-20 NOTE — Progress Notes (Signed)
  Subjective:  Patient ID: Isabel Berger, female    DOB: 1966/03/11,  MRN: [id number]  Chief Complaint  Patient presents with   Foot Pain    RM 1 L foot mass on top when putting on shoe causes pain and pressure. Pt states mass has present on dorsal aspect of foot  x 1 year.    58 y.o. female presents with the above complaint. History confirmed with patient.  This has progressed for her and continues to be bothersome  Objective:  Physical Exam: warm, good capillary refill, no trophic changes or ulcerative lesions, normal DP and PT pulses, and normal sensory exam. Left Foot: Palpable dorsal spur of the midfoot, percussion of this she has a radiating pain signal down to the big toe, some pain with manipulation of the midtarsal joint Right Foot: Palpable fullness and tenderness at the insertion of the Achilles tendon with gastrocnemius equinus  No images are attached to the encounter.  Radiographs: Multiple views x-ray of both feet: Visible dorsal spurring on the left midfoot with degenerative changes in the TMT joint, right foot there is a posterior calcaneal enthesophyte Assessment:   1. Arthritis of left midfoot      Plan:  Patient was evaluated and treated and all questions answered.  Still dealing with significant neuritis and pain from the dorsal bone spurring from midfoot arthritis.  I recommended a CT scan to evaluate the congruity of the joints as well as the remaining cartilage in the tarsometatarsal joints.  If she has significant arthrosis would recommend arthrodesis of the affected joints.  If dorsal spurring is the main issue then plan for ostectomy and neurolysis.  Follow-up with me after the CT scan to evaluate for surgical treatment options.  Return for after CT to review.

## 2024-06-20 NOTE — Patient Instructions (Signed)
Call [facility] to schedule your CT at the below locations.  Please allow at least 1 business day after your visit to process the referral.  It may take longer depending on approval from insurance.  Please let me know if you have issues or problems scheduling the CT   [facility] [phone] [address] [place],  [address]  [facility] [place] [phone] [address] [place],  [address]  

## 2024-06-25 ENCOUNTER — Encounter: Payer: Self-pay | Admitting: Family Medicine

## 2024-06-26 ENCOUNTER — Ambulatory Visit
Admission: RE | Admit: 2024-06-26 | Discharge: 2024-06-26 | Disposition: A | Discharge status: Home / Self Care | Source: Ambulatory Visit | Attending: Podiatry | Admitting: Podiatry

## 2024-06-26 DIAGNOSIS — M19072 Primary osteoarthritis, left ankle and foot: Secondary | ICD-10-CM

## 2024-06-29 ENCOUNTER — Ambulatory Visit (INDEPENDENT_AMBULATORY_CARE_PROVIDER_SITE_OTHER): Admitting: Pharmacist

## 2024-06-29 ENCOUNTER — Encounter: Payer: Self-pay | Admitting: Pharmacist

## 2024-06-29 ENCOUNTER — Telehealth: Payer: Self-pay | Admitting: Pharmacist

## 2024-06-29 VITALS — BP 128/88 | HR 88 | Wt 198.6 lb

## 2024-06-29 DIAGNOSIS — G44219 Episodic tension-type headache, not intractable: Secondary | ICD-10-CM | POA: Diagnosis not present

## 2024-06-29 DIAGNOSIS — I10 Essential (primary) hypertension: Secondary | ICD-10-CM

## 2024-06-29 DIAGNOSIS — Z23 Encounter for immunization: Secondary | ICD-10-CM | POA: Diagnosis not present

## 2024-06-29 DIAGNOSIS — Z794 Long term (current) use of insulin: Secondary | ICD-10-CM

## 2024-06-29 DIAGNOSIS — E11620 Type 2 diabetes mellitus with diabetic dermatitis: Secondary | ICD-10-CM | POA: Diagnosis present

## 2024-06-29 MED ORDER — OLMESARTAN MEDOXOMIL-HCTZ 40-12.5 MG PO TABS: 1.0000 | ORAL_TABLET | Freq: Every day | ORAL | 3 refills | Status: DC

## 2024-06-29 NOTE — Patient Instructions (Addendum)
 It was nice to see you today!  Your goal blood sugar is 80-130 before eating and less than 180 after eating.  Medication Changes: Increase olmesartan  - hydrochlorothiazide  (Benicar ) to 40 - 12.5 mg tablet  Decrease your daily caffeine and sugary beverage intake  Continue all other medication the same.   Keep up the good work with diet and exercise. Aim for a diet full of vegetables, fruit and lean meats (chicken, turkey, fish). Try to limit salt intake by eating fresh or frozen vegetables (instead of canned), rinse canned vegetables prior to cooking and do not add any additional salt to meals.

## 2024-06-29 NOTE — Assessment & Plan Note (Signed)
 Reported headache and blacking out events. Her frequent headache could be attributed to high input of caffeine beverages around 5 coke/pepsi per day. She is currently on Goody Powder at least TID with various formulations.  -Recommended patients to reduce Goody Powder to one dose per day -Suggested Tylenol  as an option when she was able to go down on the [employer]

## 2024-06-29 NOTE — Progress Notes (Signed)
 S:     Chief Complaint  Patient presents with   Medication Management    Diabetes management   58 y.o. female who presents for diabetes evaluation, education, and management. Patient arrives in good spirits and presents without any assistance. Reported she is in pain since she got 4 teeth pulled out yesterday.   Patient was referred and last seen by Primary Care Provider, Dr. Lonnie, on 06/01/2024.  At last visit,   PMH is significant for T2DM, obesity, neuropathic pain of foot, obesity, hypertension, headache.   Current diabetes medications include: Ozempic  (semaglutide ) 2 mg inj once a week  Current hypertension medications include: Benicar  20-12.5 mg once daily. She was taking olmesartan  and hydrochlorothiazide  separately from leftover stock.  Current hyperlipidemia medications include: rosuvastatin  10 mg daily   Patient reports adherence to taking all medications as prescribed.   Patient reports hypoglycemic events (~ 5 times in the last month). She would lay down during those events but does not eat/drink anything to resolve the episodes.   Patient reports nocturia (nighttime urination) for 7-8 times per day Patient reports neuropathy (nerve pain). Reported her neuropathy is terrible. 11/10 will have appointment for bone spur on the back and top of the foot.  Patient reports visual changes. Patient reports self foot exams.   Patient reported dietary habits: She has worked hard to change to a [employer]. Enjoy eating salad, creamer with coffee. 5 cans of pepsi/coke per day.  O:  Review of Systems  All other systems reviewed and are negative.  Physical Exam Constitutional:      Appearance: Normal appearance.  Pulmonary:     Effort: Pulmonary effort is normal.  Neurological:     Mental Status: She is alert.  Psychiatric:        Mood and Affect: Mood normal.        Behavior: Behavior normal.        Thought Content: Thought content normal.        Judgment:  Judgment normal.    Lab Results  Component Value Date   HGBA1C 5.4 06/01/2024   Vitals:   06/29/24 0920  BP: 128/88  Pulse: 88    Lipid Panel     Component Value Date/Time   CHOL 110 12/24/2023 1238   TRIG 88 12/24/2023 1238   HDL 40 12/24/2023 1238   CHOLHDL 2.8 12/24/2023 1238   CHOLHDL 5 11/17/2021 1212   VLDL 45.0 (H) 11/17/2021 1212   LDLCALC 53 12/24/2023 1238   LDLDIRECT 117.0 11/17/2021 1212   Patient is participating in a Managed Medicaid Plan:  Yes   A/P: Diabetes longstanding since 2011 currently controlled with most recent A1C 06/01/24 at 5.4.  Medication adherence appears good. She has not been checking blood sugar at home. -Continued GLP-1 2 mg Ozempic  (semaglutide ). Antidiabetic regimen can be adjusted accordingly at next visit with the CGM report -Recommended lifestyle interventions, dietary effects on blood sugar control.  -Counseled on s/sx of and management of hypoglycemia.  - Restarted Libre 3 for CGM to further identify low/high sugar episodes - Agreeable to cut back on caffeine/sugary beverages down to 2 coke/pepsi per day   ASCVD risk - primary prevention in patient with diabetes. Last LDL is 53 at goal of <70  mg/dL. ASCVD risk factors include T2DM, hypertension. High intensity statin indicated.  -Continued rosuvastatin  10 mg.   Hypertension longstanding since 2011 currently uncontrolled. Blood pressure goal of <130/80  mmHg. Medication adherence good. Blood pressure control is suboptimal due  to suboptimal regimen. Patient reported typical readings at home ~150/80, usually high numbers at home.  -Increased dose of Benicar  to 40-12.5 mg tablet.  -Recommended check home Blood Pressure and bring home Blood Pressure log to next visit  Reported headache and blacking out events. Her frequent headache could be attributed to high input of caffeine beverages around 5 coke/pepsi per day. She is currently on Goody Powder at least TID with various formulations.   -Recommended reduced intake of Goody Powder to one dose per day -Suggested Tylenol -8 hour (acetaminophen ) as an option when she was able to decrease the [employer] patient instructions provided. Patient verbalized understanding of treatment plan.  Total time in face to face counseling 40 minutes.    Follow-up:  Pharmacist 07/27/2024 Patient seen with Lawson Mao, PharmD Candidate - PY3 student and Recardo [employer] PharmD - PY4 Candidate.

## 2024-06-29 NOTE — Assessment & Plan Note (Signed)
 Hypertension longstanding since 2011 currently uncontrolled. Blood pressure goal of <130/80  mmHg. Medication adherence good. Blood pressure control is suboptimal due to suboptimal regimen. Patient reported typical readings at home ~150/80, usually high numbers at home.  -Increased dose of Benicar  to 40-12.5 mg tablet.  -Recommended check home Blood Pressure and bring home Blood Pressure log to next visit

## 2024-06-29 NOTE — Assessment & Plan Note (Addendum)
 Diabetes longstanding since 2011 currently controlled with most recent A1C 06/01/24 at 5.4.  Medication adherence appears good. She has not been checking blood sugar at home. -Continued GLP-1 2 mg Ozempic  (semaglutide ). Antidiabetic regimen can be adjusted accordingly at next visit with the CGM report -Recommended lifestyle interventions, dietary effects on blood sugar control.  -Counseled on s/sx of and management of hypoglycemia.  - Restarted Libre 3 for CGM to further identify low/high sugar episodes - Agreeable to cut back on caffeine/sugary beverages down to 2 coke/pepsi per day   ASCVD risk - primary prevention in patient with diabetes. Last LDL is 53 at goal of <70  mg/dL. ASCVD risk factors include T2DM, hypertension. High intensity statin indicated.  -Continued rosuvastatin  10 mg.

## 2024-06-29 NOTE — Telephone Encounter (Signed)
 Patient contacts office to report her CGM sensor, applied this AM in pharmacy clinic, has failed.   Patient requests assistance with CGM (continuous glucose monitor) supply as she only has 1 sensor at home.  She has follow-up in ~ 1 month.   Provided with  access / sample of Libre 3+ sensor   #1 - placed at front desk for pick-up  Total time with patient call and documentation of interaction: 8 minutes.

## 2024-07-04 ENCOUNTER — Ambulatory Visit: Admitting: Podiatry

## 2024-07-04 VITALS — Ht 67.0 in | Wt 198.0 lb

## 2024-07-04 DIAGNOSIS — M6788 Other specified disorders of synovium and tendon, other site: Secondary | ICD-10-CM | POA: Diagnosis not present

## 2024-07-04 DIAGNOSIS — M19072 Primary osteoarthritis, left ankle and foot: Secondary | ICD-10-CM

## 2024-07-04 DIAGNOSIS — M7732 Calcaneal spur, left foot: Secondary | ICD-10-CM | POA: Diagnosis not present

## 2024-07-04 NOTE — Progress Notes (Signed)
 Subjective:  Patient ID: Isabel Berger, female    DOB: 06-24-1966,  MRN: [id number]  Chief Complaint  Patient presents with   Foot Problem    RM 2 Patient is here for CT results.    58 y.o. female presents with the above complaint. History confirmed with patient.  She returns for follow-up it still is very painful for her and bothersome.  She completed a CT scan.  She is also dealing with recurrent Achilles tendinitis in both feet but the left is worse.  I saw her for this in May 2023 for the same issue and had her do home physical therapy and meloxicam  this helped for a few months but has returned and the spur is very painful.  Objective:  Physical Exam: warm, good capillary refill, no trophic changes or ulcerative lesions, normal DP and PT pulses, and normal sensory exam. Left Foot: Palpable dorsal spur of the midfoot, percussion of this she has a radiating pain signal down to the big toe, some pain with manipulation of the midtarsal joint, she has pain and tenderness at the insertion Achilles tendon Right Foot: Palpable fullness and tenderness at the insertion of the Achilles tendon  No images are attached to the encounter.  Radiographs: Multiple views x-ray of both feet: Visible dorsal spurring on the left midfoot with degenerative changes in the TMT joint, right foot there is a posterior calcaneal enthesophyte   Narrative & Impression  CLINICAL DATA:  Midfoot arthritis   EXAM: CT OF THE LEFT FOOT WITHOUT CONTRAST   TECHNIQUE: Multidetector CT imaging of the left foot was performed according to the standard protocol. Multiplanar CT image reconstructions were also generated.   RADIATION DOSE REDUCTION: This exam was performed according to the departmental dose-optimization program which includes automated exposure control, adjustment of the mA and/or kV according to patient size and/or use of iterative reconstruction technique.   COMPARISON:  Foot x-ray 06/21/2024    FINDINGS: Bones/Joint/Cartilage   No fracture or dislocation. Normal alignment. No joint effusion. 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 Mild osteoarthritis of the tibiotalar joint. Enthesopathic changes of the Achilles tendon insertion and a plantar calcaneal enthesophyte. Large os peroneus.   Ligaments   Ligaments are suboptimally evaluated by CT.   Muscles and Tendons Muscles are normal. No muscle atrophy. No intramuscular fluid collection or hematoma. Tendinosis of the peroneus brevis. Flexor, extensor, and Achilles tendons are intact.   Soft tissue No fluid collection or hematoma.  No soft tissue mass.   IMPRESSION: 1. 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 2. 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 3. Mild osteoarthritis of the tibiotalar joint. 4. Tendinosis of the peroneus brevis.     Electronically Signed   By: Julaine Blanch M.D.   On: 06/29/2024 07:49     Assessment:   1. Arthritis of left midfoot   2. Heel spur, left   3. Achilles tendinosis of left lower extremity      Plan:  Patient was evaluated and treated and all questions answered.  She returns for follow-up we reviewed her CT scan and her progress and discussed the presence of the spur she has mild arthritic changes with majority of her joint space is maintained most of the issue is around the spur on the dorsal 2nd and 3rd TMT.  We discussed spur resection and neurolysis for this area to alleviate her pain and inflammation discussed the risk benefits and potential complications including  the risk of further neuropathic pain scar tissue and wound healing issues.  Additionally her Achilles tendinitis has recurred and dealing with pain in the back of her heel as well, her CT does show a large posterior heel spur.  We discussed resection of the heel spur and Haglund deformity through minimally invasive approach.  We discussed the risk benefits and  Bako complications of this as well including but not limited to pain, swelling, infection, scar, numbness which may be temporary or permanent, chronic pain, stiffness, nerve pain or damage, wound healing problems, bone healing problems including delayed or non-union.  We just the recovery process including the extended period of time of nonweightbearing to weightbearing in a boot for the Achilles tendon as well as any physical therapy postoperatively.  She understands and wishes to proceed.  All questions addressed.  Informed consent signed and reviewed.   Surgical plan:  Procedure: - Left midfoot spur resection and neurolysis, Achilles tendon secondary repair and spur resection and resection of Haglund deformity left foot with PRP injection  Location: - [facility]  Anesthesia plan: - General With regional block  Postoperative pain plan: - Tylenol  1000 mg every 6 hours, gabapentin  300 mg every 8 hours x5 days, oxycodone  5 mg 1-2 tabs every 6 hours only as needed  DVT prophylaxis: - Xarelto 10 mg nightly  WB Restrictions / DME needs: - Nonweightbearing in splint postop    No follow-ups on file.

## 2024-07-10 ENCOUNTER — Telehealth: Payer: Self-pay | Admitting: Podiatry

## 2024-07-10 NOTE — Telephone Encounter (Signed)
 Called and left message for patient to return call to schedule surgery.

## 2024-07-15 ENCOUNTER — Telehealth: Payer: Self-pay | Admitting: Student

## 2024-07-15 NOTE — Telephone Encounter (Signed)
 [facility] After Hours Line Phone Call Received after hours phone from patient. Called patient and confirmed name and DOB.   Patient reports having a funny feeling after taking her medications this morning. She took her regularly prescribed olmesartan -hydrochlorothiazide  and Crestor . She checked her blood pressure and it was 89/65 she also checked her glucose and it was 65.   She denies neurological deficits. She reports approximately 60 lb weight loss from Ozempic . Instructed patient to drink/eat something sweet for sugar and eat something salty to help with BP. I recommended holding her home BP medications tomorrow until she can be seen by our office.   If she does not feel better after home interventions, instructed her to go to ED. I have scheduled her in clinic with us  Monday at 8:50 AM for follow up.   We discussed ED precautions. I am routing this note to the PCP and physician seeing patient in clinic next, as appropriate.   Damien Pinal, DO [facility], PGY-3 07/15/24 2:11 PM

## 2024-07-17 ENCOUNTER — Telehealth: Payer: Self-pay | Admitting: Pharmacist

## 2024-07-17 ENCOUNTER — Ambulatory Visit (INDEPENDENT_AMBULATORY_CARE_PROVIDER_SITE_OTHER): Payer: Self-pay | Admitting: Student

## 2024-07-17 VITALS — BP 122/78 | HR 85 | Ht 67.0 in | Wt 201.0 lb

## 2024-07-17 DIAGNOSIS — I10 Essential (primary) hypertension: Secondary | ICD-10-CM

## 2024-07-17 MED ORDER — FREESTYLE LIBRE 3 PLUS SENSOR MISC: 11 refills | Status: AC

## 2024-07-17 NOTE — Telephone Encounter (Signed)
-----   Message from Norleen April sent at 07/17/2024  9:26 AM EST ----- Regarding: CGM Libre Saw this patient this morning and she wanted to reach out to you about freestyle herlene being out.  She said he felt time the past.  Looked for you in the clinic but was unable to find you so I think I will send you a message.  She will appreciate if you can follow-up with her.  JN

## 2024-07-17 NOTE — Patient Instructions (Signed)
 Pleasure to meet you today.  I recommend cutting your blood pressure medication to half taking daily for the next 2 weeks.  In addition please take a daily blood pressure log and follow-up with us  in 2 weeks to reassess your symptoms and your blood pressure.

## 2024-07-17 NOTE — Telephone Encounter (Signed)
 Reviewed and agree with Dr Rennis plan.

## 2024-07-17 NOTE — Telephone Encounter (Signed)
 Patient contacted for follow-up of need for CGM  coverage support.   Since last contact patient reports she had multiple low readings < 70 for several days with use of CGM sensor.   Current Medications include: Ozempic  (semaglutide ) 2mg  weekly.  Reports better response with Mounjaro  (tirzepatide ) 12.5mg  weekly used in the past.  Patient denies any significant medication related side effects. Reports lack of weight loss.   Requests support with additional sample sensor to try again.  Medication Plan: - Continue same dose Ozempic  (semaglutide ) at this time.   - Sample [place] 3 sensor placed at front desk for pick-up.   Sample sensor has been provided for patient pick-up.  Maude Lagos 2:16 PM 07/17/2024    Total time with patient call and documentation of interaction: 11 minutes.  F/U visit with me in 1 week.

## 2024-07-17 NOTE — Progress Notes (Signed)
    SUBJECTIVE:   CHIEF COMPLAINT / HPI:   The patient, with hypertension and diabetes, presents with episodes of dizziness and lightheadedness.  She experiences sudden episodes of dizziness and lightheadedness, described as feeling 'like a wave' and similar to being drunk. These episodes are severe enough to interfere with her ability to work and require her to pause before driving. They occur without head movements.  Denies any jitteriness, palpitation, or sweating.  She has hypertension and takes blood pressure medication, a cholesterol pill, and gabapentin . During these episodes, her blood pressure has been as low as 89/65.   She has diabetes and takes Ozempic . She has experienced significant blood sugar fluctuations in the past but is not currently on insulin .  She drinks about twelve small bottles of water daily and has reduced soda intake  PERTINENT  PMH / PSH: Reviewed  OBJECTIVE:   BP 122/78   Pulse 85   Ht 5' 7 (1.702 m)   Wt 201 lb (91.2 kg)   SpO2 100%   BMI 31.48 kg/m    Physical Exam General: Alert, well appearing, NAD Cardiovascular: RRR, No Murmurs, Normal S2/S2 Respiratory: CTAB, No wheezing or Rales Abdomen: No distension or tenderness Extremities: No edema on extremities   Skin: Warm and dry Neuro: ANO x 3, no focal neurodeficit  ASSESSMENT/PLAN:   Dizziness and lightheadedness Intermittent dizziness and lightheadedness, differential includes vertigo vs orthostatic BP.  Given patient's reports of responding lower BP readings with symptoms and recent increased BP medication suspect possible orthostatic causes in the setting of medication.  - Reduce antihypertensive medication dose by halving the current tablet. - Monitor blood pressure daily and record readings. - Return for evaluation in two weeks or sooner if symptoms worsen.   Norleen April, MD [facility]

## 2024-07-21 ENCOUNTER — Other Ambulatory Visit (HOSPITAL_COMMUNITY): Payer: Self-pay

## 2024-07-21 ENCOUNTER — Telehealth: Payer: Self-pay

## 2024-07-21 NOTE — Telephone Encounter (Addendum)
 Rec'd PA via CoverMyMeds. Key: [employer] verification shows only Medicaid.   Per test claim, patient still has primary coverage and will need to contact Medicaid to have this removed. Attempting PA.

## 2024-07-21 NOTE — Telephone Encounter (Signed)
 Prior authorization submitted for FREESTYLE LIBRE 3 PLUS SENSORS to [facility] MEDICAID via Latent.   Key: [id number]

## 2024-07-24 NOTE — Telephone Encounter (Signed)
 Pharmacy Patient Advocate Encounter  Received notification from [facility] MEDICAID that Prior Authorization for FREESTYLE LIBRE 3 PLUS SENSORS has been DENIED.  Full denial letter will be uploaded to the media tab. See denial reason below.  Per your health plan's criteria, this product is covered if you meet the following: One of the following:   (A) All of the following:  (I) You have high blood sugars (insulin -dependent diabetes).  (II) You will use continuous glucose monitor system as prescribed.  (III) You had a face-to-face visit with your doctor to evaluate your blood sugar control (and determine that all of the above criteria has been met within six months of the request).   (B) Both of the following:  (I) You have high blood sugars (insulin -dependent diabetes).  (II) You are using an external insulin  pump.   PA #/Case ID/Reference #: [id number]

## 2024-07-27 ENCOUNTER — Telehealth: Payer: Self-pay | Admitting: Pharmacist

## 2024-07-27 ENCOUNTER — Ambulatory Visit: Admitting: Pharmacist

## 2024-07-27 DIAGNOSIS — E1165 Type 2 diabetes mellitus with hyperglycemia: Secondary | ICD-10-CM

## 2024-07-27 DIAGNOSIS — I10 Essential (primary) hypertension: Secondary | ICD-10-CM

## 2024-07-27 MED ORDER — OLMESARTAN MEDOXOMIL-HCTZ 40-12.5 MG PO TABS: 0.5000 | ORAL_TABLET | Freq: Every day | ORAL | Status: AC

## 2024-07-27 NOTE — Telephone Encounter (Signed)
 Patient contacted for follow-up of missed appointment this AM.  Patient immediately recognized that she had missed appointment and apologized.   Overall, glucose control is great with GMI of 5.6 over the last 14 days.  99% time in range.   Since last contact patient reports fewer headaches, improved tolerability of blood pressure tablet at 1/2 tablet daily  (Combination therapy of olmesartan  and hydrochlorothiazide ) doses now at 20/6.25mg  daily  Discussed lack of coverage for CGM once insulin  therapy wass discontinued.  Patient aware she could pay outside of insurance $75 for 2 sensors in future in interested in using again.   Total time with patient call and documentation of interaction: 13 minutes.

## 2024-07-28 NOTE — Telephone Encounter (Signed)
 Reviewed and agree with Dr Rennis plan.

## 2024-08-03 NOTE — Telephone Encounter (Addendum)
 Patient returned call to schedule surgery and is scheduled for 09/22/2024. Patient is on a GLP1 and has been advised to d/c use the week prior to surgery. Not on any blood thinners. Pharmacy correct in chart.

## 2024-08-09 ENCOUNTER — Telehealth: Payer: Self-pay | Admitting: Podiatry

## 2024-08-09 DIAGNOSIS — M6788 Other specified disorders of synovium and tendon, other site: Secondary | ICD-10-CM

## 2024-08-09 NOTE — Telephone Encounter (Signed)
 Patient came into office wanting to know what her expected time out of work will be after surgery. She is in a position where she has to constantly stand and is needing to let her job know. Patient also wanting to know if we can provide a knee scooter. Please advise.

## 2024-08-22 ENCOUNTER — Encounter: Payer: Self-pay | Admitting: Podiatry

## 2024-08-22 ENCOUNTER — Telehealth: Payer: Self-pay | Admitting: Podiatry

## 2024-08-22 NOTE — Telephone Encounter (Signed)
 See prev notes Per pt's request she needed a note to adv of her surgery and how long she is going to be out of work Education Officer, Environmental to pt via email on file

## 2024-08-22 NOTE — Telephone Encounter (Signed)
 Hey Isabel Berger patient is needing a letter to provide to her church stating she is having surgery and she is expected to be out for 3 months as they are helping her financially during her recovery.

## 2024-08-23 ENCOUNTER — Telehealth: Payer: Self-pay | Admitting: Podiatry

## 2024-08-23 NOTE — Telephone Encounter (Signed)
 Patient surgery date updated to new [facility] block date for provider on 09/08/2024. Patient is on GLP1 and has been advised on how to d/c use prior to surgery. Patient is not on blood thinners and patient pharmacy is correct in chart.

## 2024-08-29 ENCOUNTER — Telehealth: Payer: Self-pay

## 2024-08-29 NOTE — Telephone Encounter (Signed)
-----   Message from [place] sent at 08/28/2024 10:41 AM EST ----- Regarding: Appt needed Patient needs clearance for procedure on 1/16  but I have not met her yet, please schedule for her

## 2024-08-29 NOTE — Telephone Encounter (Signed)
 Attempted to call patient, however no answer.   Please schedule her with a provider this week for clearance when she calls back.

## 2024-09-01 ENCOUNTER — Other Ambulatory Visit: Payer: Self-pay

## 2024-09-01 ENCOUNTER — Encounter
Admission: RE | Admit: 2024-09-01 | Discharge: 2024-09-01 | Disposition: A | Source: Ambulatory Visit | Attending: Podiatry

## 2024-09-01 ENCOUNTER — Telehealth: Payer: Self-pay | Admitting: Podiatry

## 2024-09-01 DIAGNOSIS — E1142 Type 2 diabetes mellitus with diabetic polyneuropathy: Secondary | ICD-10-CM

## 2024-09-01 DIAGNOSIS — L853 Xerosis cutis: Secondary | ICD-10-CM

## 2024-09-01 DIAGNOSIS — I10 Essential (primary) hypertension: Secondary | ICD-10-CM

## 2024-09-01 DIAGNOSIS — M19072 Primary osteoarthritis, left ankle and foot: Secondary | ICD-10-CM

## 2024-09-01 DIAGNOSIS — Z01812 Encounter for preprocedural laboratory examination: Secondary | ICD-10-CM

## 2024-09-01 HISTORY — DX: Gastro-esophageal reflux disease without esophagitis: K21.9

## 2024-09-01 NOTE — Patient Instructions (Addendum)
 Your procedure is scheduled on: Jan 16 Report to the Registration Desk on the 1st floor of the [facility] To find out your arrival time, please call [phone] between 1PM - 3PM on: Jan 15 If your arrival time is 6:00 am, do not arrive before that time as the [facility] entrance doors do not open until 6:00 am.  REMEMBER: Instructions that are not followed completely may result in serious medical risk, up to and including death; or upon the discretion of your surgeon and anesthesiologist your surgery may need to be rescheduled.  Do not eat food after midnight the night before surgery.  No gum chewing or hard candies.  You may however, drink CLEAR liquids up to 2 hours before you are scheduled to arrive for your surgery. Do not drink anything within 2 hours of your scheduled arrival time.  Clear liquids include: - water    One week prior to surgery: Stop Anti-inflammatories (NSAIDS) such as Advil , Aleve , Ibuprofen , Motrin , Naproxen , Naprosyn  and Aspirin  based products such as Excedrin, Goody's Powder, BC Powder. Stop ANY OVER THE COUNTER supplements until after surgery.  You may however, continue to take Tylenol  if needed for pain up until the day of surgery.    Continue taking all of your other prescription medications up until the day of surgery.  ON THE DAY OF SURGERY ONLY TAKE THESE MEDICATIONS WITH SIPS OF WATER:  gabapentin  (NEURONTIN ) rosuvastatin  (CRESTOR ) traMADol  (ULTRAM )   No Alcohol for 24 hours before or after surgery.  No Smoking including e-cigarettes for 24 hours before surgery.  No chewable tobacco products for at least 6 hours before surgery.  No nicotine patches on the day of surgery.  Do not use any recreational drugs for at least a week (preferably 2 weeks) before your surgery.  Please be advised that the combination of cocaine and anesthesia may have negative outcomes, up to and including death. If you test positive for cocaine, your surgery  will be cancelled.  On the morning of surgery brush your teeth with toothpaste and water, you may rinse your mouth with mouthwash if you wish. Do not swallow any toothpaste or mouthwash.  Use CHG Soap or wipes as directed on instruction sheet. Provided for you ;;  Do not wear jewelry, make-up, hairpins, clips or nail polish.  Do not wear lotions, powders, or perfumes.   Do not shave body hair from the neck down 48 hours before surgery.  Contact lenses, hearing aids and dentures may not be worn into surgery.  Do not bring valuables to the hospital. [facility] is not responsible for any missing/lost belongings or valuables.    Notify your doctor if there is any change in your medical condition (cold, fever, infection).  Wear comfortable clothing (specific to your surgery type) to the hospital.  After surgery, you can help prevent lung complications by doing breathing exercises.  Take deep breaths and cough every 1-2 hours. Your doctor may order a device called an Incentive Spirometer to help you take deep breaths.   If you are being admitted to the hospital overnight, leave your suitcase in the car. After surgery it may be brought to your room.  In case of increased patient census, it may be necessary for you, the patient, to continue your postoperative care in the Same Day Surgery department.  If you are being discharged the day of surgery, you will not be allowed to drive home. You will need a responsible individual to drive you home and  stay with you for 24 hours after surgery.   If you are taking public transportation, you will need to have a responsible individual with you.  Please call the Pre-admissions Testing Dept. at [phone] if you have any questions about these instructions.  Surgery Visitation Policy:  Patients having surgery or a procedure may have two visitors.  Children under the age of 64 must have an adult with them who is not the patient.  Inpatient  Visitation:    Visiting hours are 7 a.m. to 8 p.m. Up to four visitors are allowed at one time in a patient room. The visitors may rotate out with other people during the day.  One visitor age 69 or older may stay with the patient overnight and must be in the room by 8 p.m.   Merchandiser, Retail to address health-related social needs:  [email or web address]                                                                                                                 Preparing for Surgery with CHLORHEXIDINE  GLUCONATE (CHG) Soap  Chlorhexidine  Gluconate (CHG) Soap  o An antiseptic cleaner that kills germs and bonds with the skin to continue killing germs even after washing  o Used for showering the night before surgery and morning of surgery  Before surgery, you can play an important role by reducing the number of germs on your skin.  CHG (Chlorhexidine  gluconate) soap is an antiseptic cleanser which kills germs and bonds with the skin to continue killing germs even after washing.  Please do not use if you have an allergy to CHG or antibacterial soaps. If your skin becomes reddened/irritated stop using the CHG.  1. Shower the NIGHT BEFORE SURGERY with CHG soap.  2. If you choose to wash your hair, wash your hair first as usual with your normal shampoo.  3. After shampooing, rinse your hair and body thoroughly to remove the shampoo.  4. Use CHG as you would any other liquid soap. You can apply CHG directly to the skin and wash gently with a clean washcloth.  5. Apply the CHG soap to your body only from the neck down. Do not use on open wounds or open sores. Avoid contact with your eyes, ears, mouth, and genitals (private parts). Wash face and genitals (private parts) with your normal soap.  6. Wash thoroughly, paying special attention to the area where your surgery will be performed.  7. Thoroughly rinse your body with warm water.  8. Do not shower/wash  with your normal soap after using and rinsing off the CHG soap.  9. Do not use lotions, oils, etc., after showering with CHG.  10. Pat yourself dry with a clean towel.  11. Wear clean pajamas to bed the night before surgery.  12. Place clean sheets on your bed the night of your shower and do not sleep with pets.  13. Do not apply any deodorants/lotions/powders.  14. Please wear clean clothes to the hospital.  15. Remember  to brush your teeth with your regular toothpaste.

## 2024-09-01 NOTE — Telephone Encounter (Signed)
 DOS- 09/08/2024  REPAIR, SECONDARY, ACHILLES TENDON, WITH OR WITHOUT GRAFT LT- 72345 OSTECTOMY, CALCANEOUS LT- [phone] EXCISION OR CURETTAGE OF BONE CYST OR BENIGN TUMOR, TARSAL OR METATARSAL, EXCEPT TALUS OR CALCANEOUS LT- 28104  [facility] MEDICAID EFFECTIVE DATE- 08/24/2024  PER FAX RECEIVED FROM [employer], PRIOR AUTH FOR CPT CODES 72345, [phone], AND [phone] HAVE BEEN APPROVED FROM 09/08/2024-12/07/2024. AUTH# [id number]

## 2024-09-04 ENCOUNTER — Inpatient Hospital Stay: Admission: RE | Admit: 2024-09-04 | Discharge: 2024-09-04 | Attending: Podiatry

## 2024-09-04 DIAGNOSIS — Z01818 Encounter for other preprocedural examination: Secondary | ICD-10-CM | POA: Insufficient documentation

## 2024-09-04 DIAGNOSIS — I10 Essential (primary) hypertension: Secondary | ICD-10-CM | POA: Insufficient documentation

## 2024-09-04 DIAGNOSIS — Z01812 Encounter for preprocedural laboratory examination: Secondary | ICD-10-CM

## 2024-09-04 DIAGNOSIS — M19072 Primary osteoarthritis, left ankle and foot: Secondary | ICD-10-CM | POA: Insufficient documentation

## 2024-09-04 DIAGNOSIS — Z01810 Encounter for preprocedural cardiovascular examination: Secondary | ICD-10-CM | POA: Diagnosis not present

## 2024-09-04 DIAGNOSIS — E1142 Type 2 diabetes mellitus with diabetic polyneuropathy: Secondary | ICD-10-CM | POA: Diagnosis not present

## 2024-09-04 LAB — BASIC METABOLIC PANEL WITH GFR
Anion gap: 10 (ref 5–15)
BUN: 17 mg/dL (ref 6–20)
CO2: 26 mmol/L (ref 22–32)
Calcium: 8.9 mg/dL (ref 8.9–10.3)
Chloride: 102 mmol/L (ref 98–111)
Creatinine, Ser: 1.09 mg/dL — ABNORMAL HIGH (ref 0.44–1.00)
GFR, Estimated: 59 mL/min — ABNORMAL LOW
Glucose, Bld: 132 mg/dL — ABNORMAL HIGH (ref 70–99)
Potassium: 4 mmol/L (ref 3.5–5.1)
Sodium: 137 mmol/L (ref 135–145)

## 2024-09-04 LAB — CBC
HCT: 34.5 % — ABNORMAL LOW (ref 36.0–46.0)
Hemoglobin: 11.4 g/dL — ABNORMAL LOW (ref 12.0–15.0)
MCH: 30.6 pg (ref 26.0–34.0)
MCHC: 33 g/dL (ref 30.0–36.0)
MCV: 92.7 fL (ref 80.0–100.0)
Platelets: 365 K/uL (ref 150–400)
RBC: 3.72 MIL/uL — ABNORMAL LOW (ref 3.87–5.11)
RDW: 12.2 % (ref 11.5–15.5)
WBC: 8.5 K/uL (ref 4.0–10.5)
nRBC: 0 % (ref 0.0–0.2)

## 2024-09-04 NOTE — Telephone Encounter (Signed)
 DOS- 09/08/2024   REPAIR, SECONDARY, ACHILLES TENDON, WITH OR WITHOUT GRAFT LT- 72345 OSTECTOMY, CALCANEOUS LT- [phone] EXCISION OR CURETTAGE OF BONE CYST OR BENIGN TUMOR, TARSAL OR METATARSAL, EXCEPT TALUS OR CALCANEOUS LT- 28104   [facility] MEDICAID EFFECTIVE DATE- 08/24/2024   PER FAX RECEIVED FROM [employer], PRIOR AUTH FOR CPT CODES 72345, [phone], AND [phone] HAVE BEEN APPROVED FROM 09/08/2024-12/07/2024. AUTH# [id number]

## 2024-09-05 ENCOUNTER — Telehealth: Payer: Self-pay

## 2024-09-05 NOTE — Telephone Encounter (Signed)
 Patient returns call to nurse line. She states that she was instructed to reach out to PCP office regarding labs.   Patient would like to schedule follow up visit to discuss labs. She needs to schedule prior to surgery on 1/16 as she will not be able to come in for about 4 months after procedure.   Scheduled with Dr. Orie on Thursday morning.   Chiquita JAYSON English, RN

## 2024-09-05 NOTE — Telephone Encounter (Signed)
 Patient Isabel Berger on nurse line requesting returned call to discuss recent results.   Patient had pre op labs yesterday, however, they were not ordered by [facility] provider.   Attempted to reach out to patient to advise reaching out to ordering provider, however, she did not answer. Isabel Berger requesting returned call.   Chiquita JAYSON English, RN

## 2024-09-06 ENCOUNTER — Telehealth: Payer: Self-pay | Admitting: Lab

## 2024-09-06 NOTE — Telephone Encounter (Signed)
 Received call from [facility] at [facility] regarding surgical clearance. Patient is scheduled for procedure on Friday, 09/08/24.   She has also scheduled appointment to follow up on labs tomorrow with Dr. Orie.   Dr. Lonnie- do you still have the surgical clearance form?   This will need to be completed at visit tomorrow if possible.   Chiquita JAYSON English, RN

## 2024-09-06 NOTE — Telephone Encounter (Signed)
 Patient states having surgery and was told to take medication morning of surgery but that hasn't been called in can you review and advise.

## 2024-09-07 ENCOUNTER — Ambulatory Visit (INDEPENDENT_AMBULATORY_CARE_PROVIDER_SITE_OTHER)

## 2024-09-07 VITALS — BP 126/68 | HR 88 | Ht 67.0 in | Wt 201.0 lb

## 2024-09-07 DIAGNOSIS — Z01818 Encounter for other preprocedural examination: Secondary | ICD-10-CM | POA: Diagnosis present

## 2024-09-07 NOTE — H&P (View-Only) (Signed)
" ° ° °  SUBJECTIVE:   CHIEF COMPLAINT / HPI:   Surgical Risk Assessment   Surgery: having procedure with podiatry tomorrow. Appears podiatry ordered labs and EKG, which I reviewed today and are WNL.   RISK ASSESSMENT FOR CARDIAC COMPLICATIONS:  High-risk surgery: 0 Congestive Heart Failure: 0 Coronary artery disease: 1 Cerebrovascular disease: 0 Diabetes on insulin : 0 Serum creatinine: 1.09 Total: 1/ 6 Risk for cardiac complications: Points Risk of complication  0 0.4%  1 1%  2 7%  >=3 11%   RISK ASSESSMENT FOR PULMONARY COMPLICATIONS: 1. We discussed risk factors for peri-operative pulmonary complications: --COPD: no --Age > 60yo: no --Functional dependence: none --CHF: none --ASA Class > II: no, Class I = a normally healthy patient  2. Higher-risk procedure: No 3. Risk of hypoalbuminemia:  none Prior DVT: none Family hx of DVT: none  Prior problems with anesthesia: none Hx of OSA: none Hx of asthma: none   PERTINENT  PMH / PSH: DM2, HTN  OBJECTIVE:   BP 126/68   Pulse 88   Ht 5' 7 (1.702 m)   Wt 201 lb (91.2 kg)   SpO2 100%   BMI 31.48 kg/m   Physical Exam General: Alert, conversant, cooperative. No acute distress.  HEENT: PERRL. EOMI. MMM.  Cardiovascular: RRR Respiratory: Lungs CTAB. Normal work of breathing. Musculoskeletal: No gross deformities.  Skin: Warm. Dry. No rashes. No icterus.  Neurologic: No focal deficits. Moving all extremities. Psychiatric: Cooperative. Appropriate mood. Appropriate affect.   ASSESSMENT/PLAN:   Assessment & Plan Pre-op exam Pt is having low risk surgical procedure tomorrow. Her surgical risk is low. Reviewed labs and EKG ordered by podiatry, all within normal limits. Surgical form complete today, copied, faxed to office. Pt also given form and states she will bring to their office today.      Milda LITTIE Deed, MD [facility] "

## 2024-09-07 NOTE — Patient Instructions (Signed)
 It was good to see you today.   Please bring ALL of your medications with you to every visit.    Today we talked about: Surgical risk assessment. You are a low risk surgical candidate. Your form was complete. Your labs were reviewed. 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     Thank you for choosing [facility]. Please refer to your mychart for specifics regarding today's visit or future appointments.

## 2024-09-07 NOTE — Progress Notes (Signed)
" ° ° °  SUBJECTIVE:   CHIEF COMPLAINT / HPI:   Surgical Risk Assessment   Surgery: having procedure with podiatry tomorrow. Appears podiatry ordered labs and EKG, which I reviewed today and are WNL.   RISK ASSESSMENT FOR CARDIAC COMPLICATIONS:  High-risk surgery: 0 Congestive Heart Failure: 0 Coronary artery disease: 1 Cerebrovascular disease: 0 Diabetes on insulin : 0 Serum creatinine: 1.09 Total: 1/ 6 Risk for cardiac complications: Points Risk of complication  0 0.4%  1 1%  2 7%  >=3 11%   RISK ASSESSMENT FOR PULMONARY COMPLICATIONS: 1. We discussed risk factors for peri-operative pulmonary complications: --COPD: no --Age > 60yo: no --Functional dependence: none --CHF: none --ASA Class > II: no, Class I = a normally healthy patient  2. Higher-risk procedure: No 3. Risk of hypoalbuminemia:  none Prior DVT: none Family hx of DVT: none  Prior problems with anesthesia: none Hx of OSA: none Hx of asthma: none   PERTINENT  PMH / PSH: DM2, HTN  OBJECTIVE:   BP 126/68   Pulse 88   Ht 5' 7 (1.702 m)   Wt 201 lb (91.2 kg)   SpO2 100%   BMI 31.48 kg/m   Physical Exam General: Alert, conversant, cooperative. No acute distress.  HEENT: PERRL. EOMI. MMM.  Cardiovascular: RRR Respiratory: Lungs CTAB. Normal work of breathing. Musculoskeletal: No gross deformities.  Skin: Warm. Dry. No rashes. No icterus.  Neurologic: No focal deficits. Moving all extremities. Psychiatric: Cooperative. Appropriate mood. Appropriate affect.   ASSESSMENT/PLAN:   Assessment & Plan Pre-op exam Pt is having low risk surgical procedure tomorrow. Her surgical risk is low. Reviewed labs and EKG ordered by podiatry, all within normal limits. Surgical form complete today, copied, faxed to office. Pt also given form and states she will bring to their office today.      Milda LITTIE Deed, MD [facility] "

## 2024-09-08 ENCOUNTER — Ambulatory Visit

## 2024-09-08 ENCOUNTER — Encounter: Payer: Self-pay | Admitting: Podiatry

## 2024-09-08 ENCOUNTER — Other Ambulatory Visit: Payer: Self-pay

## 2024-09-08 ENCOUNTER — Encounter: Payer: Self-pay | Admitting: Anesthesiology

## 2024-09-08 ENCOUNTER — Ambulatory Visit
Admission: RE | Admit: 2024-09-08 | Discharge: 2024-09-08 | Disposition: A | Discharge status: Home / Self Care | Attending: Podiatry | Admitting: Podiatry

## 2024-09-08 ENCOUNTER — Ambulatory Visit: Payer: Self-pay | Admitting: Anesthesiology

## 2024-09-08 DIAGNOSIS — M19072 Primary osteoarthritis, left ankle and foot: Secondary | ICD-10-CM | POA: Diagnosis not present

## 2024-09-08 DIAGNOSIS — M6788 Other specified disorders of synovium and tendon, other site: Secondary | ICD-10-CM | POA: Insufficient documentation

## 2024-09-08 DIAGNOSIS — I10 Essential (primary) hypertension: Secondary | ICD-10-CM | POA: Insufficient documentation

## 2024-09-08 DIAGNOSIS — M9262 Juvenile osteochondrosis of tarsus, left ankle: Secondary | ICD-10-CM

## 2024-09-08 DIAGNOSIS — Z01812 Encounter for preprocedural laboratory examination: Secondary | ICD-10-CM

## 2024-09-08 DIAGNOSIS — M67874 Other specified disorders of tendon, left ankle and foot: Secondary | ICD-10-CM

## 2024-09-08 DIAGNOSIS — Z79899 Other long term (current) drug therapy: Secondary | ICD-10-CM | POA: Diagnosis not present

## 2024-09-08 DIAGNOSIS — M7732 Calcaneal spur, left foot: Secondary | ICD-10-CM | POA: Diagnosis not present

## 2024-09-08 DIAGNOSIS — E1142 Type 2 diabetes mellitus with diabetic polyneuropathy: Secondary | ICD-10-CM

## 2024-09-08 DIAGNOSIS — K219 Gastro-esophageal reflux disease without esophagitis: Secondary | ICD-10-CM | POA: Insufficient documentation

## 2024-09-08 HISTORY — PX: EXCISION BONE CYST: SHX6616

## 2024-09-08 HISTORY — PX: CALCANEAL OSTEOTOMY: SHX1281

## 2024-09-08 HISTORY — PX: ACHILLES TENDON SURGERY: SHX542

## 2024-09-08 LAB — GLUCOSE, CAPILLARY
Glucose-Capillary: 79 mg/dL (ref 70–99)
Glucose-Capillary: 139 mg/dL — ABNORMAL HIGH (ref 70–99)

## 2024-09-08 MED ORDER — PROPOFOL 10 MG/ML IV BOLUS
INTRAVENOUS | Status: DC | PRN
  Administered 2024-09-08: 150 mg via INTRAVENOUS

## 2024-09-08 MED ORDER — BUPIVACAINE HCL (PF) 0.5 % IJ SOLN
INTRAMUSCULAR | Status: AC
  Filled 2024-09-08: qty 20

## 2024-09-08 MED ORDER — ACETAMINOPHEN 500 MG PO TABS: 1000.0000 mg | ORAL_TABLET | Freq: Four times a day (QID) | ORAL | 0 refills | Status: AC | PRN

## 2024-09-08 MED ORDER — MIDAZOLAM HCL (PF) 2 MG/2ML IJ SOLN
INTRAMUSCULAR | Status: DC | PRN
  Administered 2024-09-08: 2 mg via INTRAVENOUS

## 2024-09-08 MED ORDER — CHLORHEXIDINE GLUCONATE 0.12 % MT SOLN
15.0000 mL | Freq: Once | OROMUCOSAL | Status: AC
  Administered 2024-09-08: 15 mL via OROMUCOSAL

## 2024-09-08 MED ORDER — ONDANSETRON HCL 4 MG/2ML IJ SOLN
INTRAMUSCULAR | Status: AC
  Filled 2024-09-08: qty 2

## 2024-09-08 MED ORDER — ACETAMINOPHEN 10 MG/ML IV SOLN
INTRAVENOUS | Status: AC
  Filled 2024-09-08: qty 100

## 2024-09-08 MED ORDER — KETAMINE HCL 50 MG/5ML IJ SOSY
PREFILLED_SYRINGE | INTRAMUSCULAR | Status: AC
  Filled 2024-09-08: qty 5

## 2024-09-08 MED ORDER — PHENYLEPHRINE HCL-NACL 20-0.9 MG/250ML-% IV SOLN
INTRAVENOUS | Status: DC | PRN
  Administered 2024-09-08: 40 ug/min via INTRAVENOUS

## 2024-09-08 MED ORDER — ROCURONIUM BROMIDE 10 MG/ML (PF) SYRINGE
PREFILLED_SYRINGE | INTRAVENOUS | Status: AC
  Filled 2024-09-08: qty 10

## 2024-09-08 MED ORDER — PROPOFOL 10 MG/ML IV BOLUS
INTRAVENOUS | Status: AC
  Filled 2024-09-08: qty 20

## 2024-09-08 MED ORDER — SODIUM CHLORIDE 0.9 % IV SOLN: INTRAVENOUS | Status: DC

## 2024-09-08 MED ORDER — FENTANYL CITRATE (PF) 100 MCG/2ML IJ SOLN
INTRAMUSCULAR | Status: DC | PRN
  Administered 2024-09-08 (×2): 50 ug via INTRAVENOUS

## 2024-09-08 MED ORDER — MIDAZOLAM HCL 2 MG/2ML IJ SOLN
INTRAMUSCULAR | Status: AC
  Filled 2024-09-08: qty 2

## 2024-09-08 MED ORDER — OXYCODONE HCL 5 MG PO TABS: 5.0000 mg | ORAL_TABLET | Freq: Once | ORAL | Status: DC | PRN

## 2024-09-08 MED ORDER — ROCURONIUM BROMIDE 100 MG/10ML IV SOLN
INTRAVENOUS | Status: DC | PRN
  Administered 2024-09-08: 70 mg via INTRAVENOUS

## 2024-09-08 MED ORDER — DEXMEDETOMIDINE HCL IN NACL 80 MCG/20ML IV SOLN
INTRAVENOUS | Status: DC | PRN
  Administered 2024-09-08 (×2): 8 ug via INTRAVENOUS

## 2024-09-08 MED ORDER — DEXAMETHASONE SOD PHOSPHATE PF 10 MG/ML IJ SOLN
INTRAMUSCULAR | Status: AC
  Filled 2024-09-08: qty 1

## 2024-09-08 MED ORDER — OXYCODONE HCL 5 MG PO TABS: 5.0000 mg | ORAL_TABLET | ORAL | 0 refills | Status: DC | PRN

## 2024-09-08 MED ORDER — BUPIVACAINE HCL (PF) 0.5 % IJ SOLN
INTRAMUSCULAR | Status: DC | PRN
  Administered 2024-09-08 (×2): 10 mL via PERINEURAL

## 2024-09-08 MED ORDER — GLYCOPYRROLATE 0.2 MG/ML IJ SOLN
INTRAMUSCULAR | Status: DC | PRN
  Administered 2024-09-08: .1 mg via INTRAVENOUS

## 2024-09-08 MED ORDER — ONDANSETRON HCL 4 MG/2ML IJ SOLN
INTRAMUSCULAR | Status: DC | PRN
  Administered 2024-09-08: 4 mg via INTRAVENOUS

## 2024-09-08 MED ORDER — DEXMEDETOMIDINE HCL IN NACL 80 MCG/20ML IV SOLN
INTRAVENOUS | Status: AC
  Filled 2024-09-08: qty 20

## 2024-09-08 MED ORDER — DEXAMETHASONE SOD PHOSPHATE PF 10 MG/ML IJ SOLN
INTRAMUSCULAR | Status: DC | PRN
  Administered 2024-09-08: 10 mg via INTRAVENOUS

## 2024-09-08 MED ORDER — CEFAZOLIN SODIUM-DEXTROSE 2-4 GM/100ML-% IV SOLN
INTRAVENOUS | Status: AC
  Filled 2024-09-08: qty 100

## 2024-09-08 MED ORDER — BUPIVACAINE LIPOSOME 1.3 % IJ SUSP
INTRAMUSCULAR | Status: AC
  Filled 2024-09-08: qty 20

## 2024-09-08 MED ORDER — CHLORHEXIDINE GLUCONATE 0.12 % MT SOLN
OROMUCOSAL | Status: AC
  Filled 2024-09-08: qty 15

## 2024-09-08 MED ORDER — KETAMINE HCL 50 MG/5ML IJ SOSY
PREFILLED_SYRINGE | INTRAMUSCULAR | Status: DC | PRN
  Administered 2024-09-08: 10 mg via INTRAVENOUS
  Administered 2024-09-08: 20 mg via INTRAVENOUS

## 2024-09-08 MED ORDER — LIDOCAINE HCL (CARDIAC) PF 100 MG/5ML IV SOSY
PREFILLED_SYRINGE | INTRAVENOUS | Status: DC | PRN
  Administered 2024-09-08: 100 mg via INTRAVENOUS

## 2024-09-08 MED ORDER — PHENYLEPHRINE HCL-NACL 20-0.9 MG/250ML-% IV SOLN
INTRAVENOUS | Status: AC
  Filled 2024-09-08: qty 250

## 2024-09-08 MED ORDER — LIDOCAINE HCL (PF) 2 % IJ SOLN
INTRAMUSCULAR | Status: AC
  Filled 2024-09-08: qty 5

## 2024-09-08 MED ORDER — LIDOCAINE-EPINEPHRINE (PF) 1 %-1:200000 IJ SOLN
INTRAMUSCULAR | Status: AC
  Filled 2024-09-08: qty 30

## 2024-09-08 MED ORDER — ORAL CARE MOUTH RINSE: 15.0000 mL | Freq: Once | OROMUCOSAL | Status: AC

## 2024-09-08 MED ORDER — FENTANYL CITRATE (PF) 100 MCG/2ML IJ SOLN
INTRAMUSCULAR | Status: AC
  Filled 2024-09-08: qty 2

## 2024-09-08 MED ORDER — ACETAMINOPHEN 10 MG/ML IV SOLN: 1000.0000 mg | Freq: Once | INTRAVENOUS | Status: DC | PRN

## 2024-09-08 MED ORDER — GLYCOPYRROLATE 0.2 MG/ML IJ SOLN
INTRAMUSCULAR | Status: AC
  Filled 2024-09-08: qty 1

## 2024-09-08 MED ORDER — CEFAZOLIN SODIUM-DEXTROSE 2-4 GM/100ML-% IV SOLN
2.0000 g | Freq: Once | INTRAVENOUS | Status: AC
  Administered 2024-09-08: 2 g via INTRAVENOUS

## 2024-09-08 MED ORDER — PHENYLEPHRINE 80 MCG/ML (10ML) SYRINGE FOR IV PUSH (FOR BLOOD PRESSURE SUPPORT)
PREFILLED_SYRINGE | INTRAVENOUS | Status: AC
  Filled 2024-09-08: qty 10

## 2024-09-08 MED ORDER — DROPERIDOL 2.5 MG/ML IJ SOLN: 0.6250 mg | Freq: Once | INTRAMUSCULAR | Status: DC | PRN

## 2024-09-08 MED ORDER — RIVAROXABAN 10 MG PO TABS: 10.0000 mg | ORAL_TABLET | Freq: Every day | ORAL | 0 refills | Status: AC

## 2024-09-08 MED ORDER — ACETAMINOPHEN 10 MG/ML IV SOLN
INTRAVENOUS | Status: DC | PRN
  Administered 2024-09-08: 1000 mg via INTRAVENOUS

## 2024-09-08 MED ORDER — PHENYLEPHRINE 80 MCG/ML (10ML) SYRINGE FOR IV PUSH (FOR BLOOD PRESSURE SUPPORT)
PREFILLED_SYRINGE | INTRAVENOUS | Status: DC | PRN
  Administered 2024-09-08 (×6): 80 ug via INTRAVENOUS

## 2024-09-08 MED ORDER — OXYCODONE HCL 5 MG/5ML PO SOLN: 5.0000 mg | Freq: Once | ORAL | Status: DC | PRN

## 2024-09-08 MED ORDER — BUPIVACAINE LIPOSOME 1.3 % IJ SUSP
INTRAMUSCULAR | Status: DC | PRN
  Administered 2024-09-08 (×2): 10 mL via PERINEURAL

## 2024-09-08 MED ORDER — LIDOCAINE-EPINEPHRINE (PF) 1 %-1:200000 IJ SOLN
INTRAMUSCULAR | Status: DC | PRN
  Administered 2024-09-08: 10 mL
  Administered 2024-09-08: 5 mL

## 2024-09-08 MED ORDER — FENTANYL CITRATE (PF) 100 MCG/2ML IJ SOLN: 25.0000 ug | INTRAMUSCULAR | Status: DC | PRN

## 2024-09-08 MED ORDER — 0.9 % SODIUM CHLORIDE (POUR BTL) OPTIME
TOPICAL | Status: DC | PRN
  Administered 2024-09-08: 500 mL

## 2024-09-08 MED ORDER — SUGAMMADEX SODIUM 200 MG/2ML IV SOLN
INTRAVENOUS | Status: DC | PRN
  Administered 2024-09-08: 190 mg via INTRAVENOUS

## 2024-09-08 NOTE — Discharge Instructions (Addendum)
 Post-Surgery Instructions  1. If you are recuperating from surgery anywhere other than home, please be sure to leave us  a number where you can be reached. 2. Go directly home and rest. 3. The keep operated foot (or feet) elevated six inches above the hip when sitting or lying down. 4. Support the elevated foot and leg with pillows under the calf. DO NOT PLACE PILLOWS UNDER THE KNEE. 5. DO NOT REMOVE or get your bandages wet. This will increase your chances of getting an infection. 6. Wear your surgical splint at all times when you are up. Do not remove it unless directed 7. A limited amount of pain and swelling may occur. The skin may take on a bruised appearance. This is no cause for alarm. 8. Apply an ice pack behind the knee and calf above the splint for 15 minutes every hour. Continue icing until seen in the office. DO NOT apply any form of heat to the area. 9. Have prescription(s) filled immediately and take as directed. 10. Drink lots of liquids, water, and juice. 11. CALL THE OFFICE IMMEDIATELY IF: a. Bleeding continues b. Pain increases and/or does not respond to medication c. Bandage or cast appears too tight d. Any liquids (water, coffee, etc.) have spilled on your bandages. e. Tripping, falling, or stubbing the surgical foot f. If your temperature rises above 101 g. If you have ANY questions at all 12. Please use the crutches, knee scooter, or walker you have prescribed, rented, or purchased. If you are non-weight bearing DO NOT put weight on the operated foot for _________ days. If you are weight-bearing, follow your physicians instructions. You are expected to be:  ? non-weight bearing 13. Special Instructions: _____________________________________________________________ _________________________________________________________________________________ _________________________________________________________________________________  14. Your next appointment is:  09/28/2024 10:00 AM   If you need to reach the nurse for any reason, please call: [place]: [phone] [place]: [phone] [place]: [phone]

## 2024-09-08 NOTE — Anesthesia Procedure Notes (Signed)
 Anesthesia Regional Block: Adductor canal block   Pre-Anesthetic Checklist: , timeout performed,  Correct Patient, Correct Site, Correct Laterality,  Correct Procedure, Correct Position, site marked,  Risks and benefits discussed,  Surgical consent,  Pre-op evaluation,  At surgeon's request and post-op pain management  Laterality: Left and Lower  Prep: chloraprep, Dura Prep       Needles:  Injection technique: Single-shot  Needle Type: Echogenic Needle     Needle Length: 10cm  Needle Gauge: 20     Additional Needles:   Procedures:,,,, ultrasound used (permanent image in chart),,    Narrative:  End time: 09/08/2024 11:55 AM Injection made incrementally with aspirations every 5 mL.  Performed by: Personally

## 2024-09-08 NOTE — Transfer of Care (Signed)
 Immediate Anesthesia Transfer of Care Note  Patient: Davin Muramoto Riehl  Procedure(s) Performed: REPAIR, TENDON, ACHILLES (Left: Foot) EXCISION, CYST, BONE (Left: Foot) OSTEOTOMY, CALCANEUS (Left: Foot)  Patient Location: PACU  Anesthesia Type:General  Level of Consciousness: awake, alert , and drowsy  Airway & Oxygen Therapy: Patient Spontanous Breathing and Patient connected to face mask oxygen  Post-op Assessment: Report given to RN  Post vital signs: Reviewed and stable  Last Vitals:  Vitals Value Taken Time  BP 130/76 09/08/24 10:48  Temp 36 C 09/08/24 10:48  Pulse 95 09/08/24 10:51  Resp 14 09/08/24 10:51  SpO2 100 % 09/08/24 10:51  Vitals shown include unfiled device data.  Last Pain:  Vitals:   09/08/24 0618  TempSrc: Temporal  PainSc: 0-No pain         Complications: No notable events documented.

## 2024-09-08 NOTE — Anesthesia Postprocedure Evaluation (Signed)
"   Anesthesia Post Note  Patient: Teleah Villamar Nuttall  Procedure(s) Performed: REPAIR, TENDON, ACHILLES (Left: Foot) EXCISION, CYST, BONE (Left: Foot) OSTEOTOMY, CALCANEUS (Left: Foot)  Patient location during evaluation: PACU Anesthesia Type: General Level of consciousness: awake and alert Pain management: pain level controlled Vital Signs Assessment: post-procedure vital signs reviewed and stable Respiratory status: spontaneous breathing, nonlabored ventilation, respiratory function stable and patient connected to nasal cannula oxygen Cardiovascular status: blood pressure returned to baseline and stable Postop Assessment: no apparent nausea or vomiting Anesthetic complications: no   No notable events documented.   Last Vitals:  Vitals:   09/08/24 1048 09/08/24 1052  BP: 130/76   Pulse: 96 90  Resp: (!) 9 11  Temp: (!) 36 C   SpO2: 100% 100%    Last Pain:  Vitals:   09/08/24 1048  TempSrc:   PainSc: Asleep                 Lynwood KANDICE Clause      "

## 2024-09-08 NOTE — Anesthesia Preprocedure Evaluation (Signed)
"                                    Anesthesia Evaluation  Patient identified by MRN, date of birth, ID band Patient awake    Reviewed: Allergy & Precautions, H&P , NPO status , Patient's Chart, lab work & pertinent test results, reviewed documented beta blocker date and time   Airway Mallampati: II  TM Distance: >3 FB Neck ROM: full    Dental  (+) Teeth Intact   Pulmonary neg shortness of breath, asthma    Pulmonary exam normal        Cardiovascular Exercise Tolerance: Good hypertension, On Medications negative cardio ROS Normal cardiovascular exam Rhythm:regular Rate:Normal     Neuro/Psych  Headaches  Neuromuscular disease  negative psych ROS   GI/Hepatic Neg liver ROS,GERD  Medicated,,  Endo/Other  negative endocrine ROSdiabetes    Renal/GU negative Renal ROS  negative genitourinary   Musculoskeletal   Abdominal   Peds  Hematology negative hematology ROS (+)   Anesthesia Other Findings Past Medical History: No date: Allergy     Comment:  pollen No date: Asthma     Comment:  as a child No date: Constipation No date: Depression No date: Diabetes mellitus No date: Family history of adverse reaction to anesthesia     Comment:  Mother- N/V No date: GERD (gastroesophageal reflux disease) No date: Headache No date: Hypertension No date: Mini stroke     Comment:  10 years ago Past Surgical History: No date: ABDOMINAL HYSTERECTOMY 2008 x 3: FOOT SURGERY; Bilateral     Comment:  Dr Jerona Sage  11/18/2021: TENNIS ELBOW RELEASE/NIRSCHEL PROCEDURE; Left     Comment:  Procedure: LEFT LATERAL EPICONDYLE DEBRIDEMENT, COMMON               EXTENSOR TENDON REPAIR;  Surgeon: Romona Harari, MD;               Location: [facility] OR;  Service: Orthopedics;  Laterality:               Left; 2004: TOTAL ABDOMINAL HYSTERECTOMY W/ BILATERAL SALPINGOOPHORECTOMY     Comment:  fibroids   Reproductive/Obstetrics negative OB ROS                               Anesthesia Physical Anesthesia Plan  ASA: 3  Anesthesia Plan: General ETT   Post-op Pain Management: Regional block*   Induction:   PONV Risk Score and Plan:   Airway Management Planned:   Additional Equipment:   Intra-op Plan:   Post-operative Plan:   Informed Consent: I have reviewed the patients History and Physical, chart, labs and discussed the procedure including the risks, benefits and alternatives for the proposed anesthesia with the patient or authorized representative who has indicated his/her understanding and acceptance.     Dental Advisory Given  Plan Discussed with: CRNA  Anesthesia Plan Comments: (As discussed with patient will plan for post op pain block in pacu. ja)        Anesthesia Quick Evaluation  "

## 2024-09-08 NOTE — Anesthesia Procedure Notes (Signed)
 Anesthesia Regional Block: Popliteal block   Pre-Anesthetic Checklist: , timeout performed,  Correct Patient, Correct Site, Correct Laterality,  Correct Procedure, Correct Position, site marked,  Risks and benefits discussed,  Surgical consent,  Pre-op evaluation,  At surgeon's request and post-op pain management  Laterality: Left and Lower  Prep: chloraprep       Needles:  Injection technique: Single-shot  Needle Type: Echogenic Stimulator Needle     Needle Length: 10cm  Needle Gauge: 20     Additional Needles:   Procedures:,,,, ultrasound used (permanent image in chart),,    Narrative:  End time: 09/08/2024 11:45 AM  Performed by: Personally  Anesthesiologist: Myra Lynwood MATSU, MD  Additional Notes: Pt. Identified and accepting of procedure after risks and benefits fully reviewed and questions answered. Time out performed and laterality confirmed prior to procedure.  ISNB  performed without difficulty and well tolerated.  Neg IV and SATD.  No pain on injection of Local anesthetic and VSST.

## 2024-09-08 NOTE — Interval H&P Note (Signed)
 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  The various methods of treatment have been discussed with the patient and family. After consideration of risks, benefits and other options for treatment, the patient has consented to Achilles repair, spur resection posterior heel, and dorsal midfoot ostectomy of the left foot as a surgical intervention.  The patient's history has been reviewed, patient examined, no change in status, stable for surgery.  I have reviewed the patient's chart and labs.  Questions were answered to the patient's satisfaction.     Juliene JONELLE Medicine

## 2024-09-08 NOTE — Op Note (Addendum)
 Patient Name: Isabel Berger [facility] DOB: 04-06-1966  MRN: [id number]   Date of Service: 09/08/2024  Surgeon: Dr. Juliene Medicine, DPM Assistants: None Pre-operative Diagnosis:  Achilles tendinosis of left lower extremity  Heel spur, left Arthritis of left midfoot  Post-operative Diagnosis:  Achilles tendinosis of left lower extremity  Heel spur, left  Arthritis of left midfoot  Procedures: Secondary Achilles repair left foot Resection of Haglund deformity and retrocalcaneal spur left foot Midfoot ostectomy tarsal bones left foot Platelet rich plasma injection Achilles tendon Pathology/Specimens: * No specimens in log * Anesthesia: General With postoperative regional block Hemostasis:  Total Tourniquet Time Documented: Thigh (Left) - 21 minutes Thigh (Left) - 67 minutes Total: Thigh (Left) - 88 minutes  Estimated Blood Loss: 50 cc Materials:  Implant Name Type Inv. Item Serial No. Manufacturer Lot No. LRB No. Used Action  ANCH SPEEDBR FIBERTAK KLS 1.7 - [id number] Anchor ANCH SPEEDBR FIBERTAK KLS 1.7  [employer] [id number] Left 1 Implanted   Medications: 15 cc 1% lidocaine  with epinephrine  Complications: No complications noted  Indications for Procedure:  This is a 59 y.o. female with a history of chronic Achilles tendon gnosis, Haglund deformity and retrocalcaneal spur.  She also had a painful exostosis on the dorsal midfoot compressing her peroneal nerve.  After failing nonoperative treatment she elected for operative intervention. All risks, benefits and potential complications discussed prior to the procedure. All questions addressed. Informed consent signed and reviewed.      Procedure in Detail: Patient was identified in pre-operative holding area. Formal consent was signed and the left lower extremity was marked. Patient was brought back to the operating room. Anesthesia was induced. The extremity was prepped and draped in the usual sterile fashion. Timeout was taken to  confirm patient name, laterality, and procedure prior to incision.  35 cc of whole blood was harvested from the antecubital fossa and centrifuged for 8 cc of PRP.  We began in the supine position.  A longitudinal incision was made on the dorsal midfoot over the exostosis just lateral to the extensor hallucis longus tendon.  The dorsalis pedis was readily palpable and was put lateral to the exostosis.  Dissection was carried deep through subcutaneous tissue and deep tissue the deep fascia was incised and the periosteum was reflected from the dorsal cuneiform and navicular.  The exostosis was identified isolated and resected with rongeur and power rasp.  It was irrigated thoroughly bony debris.  This incision was then irrigated and closed with 3-0 Monocryl and 4-0 nylon.  Sterile dressing was applied, the drapes were removed and the patient was placed into the prone position.  The foot and ankle and leg to the knee was then reprepped and draped.    I then directed my attention to the posterior heel where 4 portals were placed at the level of the Haglund deformity and the inferior calcaneal enthesophyte medial and lateral adjacent to the Achilles tendon.  Blunt dissection was used to raise the posterior soft tissues off of the Achilles and the spur.  A Sayer elevator was then used to create a working space between the Achilles and the posterior heel.  The endoscope and shaver were then introduced into the superior portals and the Achilles tendon was debrided of all hypertrophic tissue and nonviable tissue at the area of the calcification and hypertrophy.  This was removed in the Arthrex wedge bur was then inserted in the inferior portals.  This was used to resect the Haglund deformity and posterior calcaneal spur  with fluoroscopic guidance.  Once adequate resection was completed the area was thoroughly irrigated of all bony paste and debris.  Once this was completed the Arthrex speed bridge anchor holes were  drilled and the anchors were inserted according to the manufacturer's guidelines utilizing the knotless ripstop mechanism to adhere the Achilles tendon to the resected bone.  Good stability and purchase of all anchors was noted.  The incision was then irrigated and closed with 4-0 nylon.  The platelet rich plasma was then injected into and around the Achilles tendon insertion  The foot was then dressed with Xeroform dry sterile dressings and a well-padded below the knee posterior splint. Patient tolerated the procedure well.   Disposition: Following a period of post-operative monitoring, patient will be transferred to home.

## 2024-09-08 NOTE — Addendum Note (Signed)
 Addendum  created 09/08/24 1232 by Myra Lynwood MATSU, MD   Child order released for a procedure order, Clinical Note Signed, Intraprocedure Blocks edited, Intraprocedure Meds edited, SmartForm saved

## 2024-09-08 NOTE — Anesthesia Procedure Notes (Signed)
 Procedure Name: Intubation Date/Time: 09/08/2024 7:50 AM  Performed by: Jackye Spanner, CRNAPre-anesthesia Checklist: Patient identified, Patient being monitored, Timeout performed, Emergency Drugs available and Suction available Patient Re-evaluated:Patient Re-evaluated prior to induction Oxygen Delivery Method: Circle system utilized Preoxygenation: Pre-oxygenation with 100% oxygen Induction Type: IV induction Ventilation: Mask ventilation without difficulty Laryngoscope Size: 3 and McGrath Grade View: Grade I Tube type: Oral Tube size: 7.0 mm Number of attempts: 1 Airway Equipment and Method: Stylet Placement Confirmation: ETT inserted through vocal cords under direct vision, positive ETCO2 and breath sounds checked- equal and bilateral Secured at: 22 cm Tube secured with: Tape Dental Injury: Teeth and Oropharynx as per pre-operative assessment  Comments: Smooth atraumatic intubation, no complications noted.

## 2024-09-12 ENCOUNTER — Encounter: Payer: Self-pay | Admitting: Podiatry

## 2024-09-13 ENCOUNTER — Telehealth: Payer: Self-pay | Admitting: Podiatry

## 2024-09-13 MED ORDER — OXYCODONE HCL 5 MG PO TABS: 5.0000 mg | ORAL_TABLET | ORAL | 0 refills | Status: AC | PRN

## 2024-09-13 NOTE — Telephone Encounter (Signed)
 Patient called in requesting refill of oxyCODONE  5 mg immediate release tablets. States she has been having a lot of pain since bloc wore off and is having to take them every 4 hours. Pharmacy is correct in chart.

## 2024-09-14 ENCOUNTER — Ambulatory Visit (INDEPENDENT_AMBULATORY_CARE_PROVIDER_SITE_OTHER): Admitting: Podiatry

## 2024-09-14 ENCOUNTER — Ambulatory Visit

## 2024-09-14 VITALS — BP 111/70 | HR 92 | Temp 97.2°F

## 2024-09-14 DIAGNOSIS — M6788 Other specified disorders of synovium and tendon, other site: Secondary | ICD-10-CM

## 2024-09-14 NOTE — Progress Notes (Signed)
 Patient presents for post-op visit today, POV #1 DOS 09/08/24 LT CALACANEAL OSTEOTOMY, LT TARSAL EXOSTECTOMY, LT REPAIR SECONDARY ACHILLES TENDON W/ OR W/O GRAFT)   I fell 09/09/24. Pain was off the charts, the pain I get nowis a 10-12. Pressure in my right hip..  Notes: n/a  Vital Signs: Today's Vitals   09/14/24 1622  BP: 111/70  Pulse: 92  Temp: (!) 97.2 F (36.2 C)  TempSrc: Oral  PainSc: 10-Worst pain ever      Radiographs: [x]  Taken []  Not taken  Surgical Site Assessment:  - Dressing:  [x]  Minimal dry blood, intact []  Reinforced   []  Changed     -Notes: n/a  - Incision:  [x]  CDI (clean, dry, intact)  [x]  Mild erythema  []  Drainage noted   -Notes: n/a  - Swelling:  []  None  []  Mild  [x]  Moderate   []  Significant     -Notes: n/a  - Bruising:  []  None  [x]  Present: posterior ankle and along dorsal aspect incision   - Sutures/Staples:  []  None [x]  Intact  []  Removed Today  [x]  Plan to remove at next visit   -Cast/Splint/Pins: [x]  None []  Intact []  Removed Today []  Plan to remove at next visit []  Replaced  -Signs of infection:  [x]  None  []  Present - Describe: n/a  -DME:    []  None []  AFW []  Surgical shoe []  Cast  [x]  Splint  -Walking status:  []  Full WB  []  Partial WB  [x]  NWB  -Utilizing device:  []  None []  Knee Scooter []  Crutches [x]  Wheelchair    DVT assessment:  [x]  Denies symptoms []  Chest pain/SOB []  Pain in calf/redness/warmth   Redressed DSD and ace wrap. Educated on signs of infection, proper dressing care, pain management, and weight bearing status. Patient will contact provider with any new or worsening symptoms. The provider assessed the patient today and reviewed instructions regarding plan of care.

## 2024-09-17 ENCOUNTER — Encounter: Payer: Self-pay | Admitting: Podiatry

## 2024-09-17 ENCOUNTER — Other Ambulatory Visit: Payer: Self-pay | Admitting: Family Medicine

## 2024-09-17 DIAGNOSIS — E119 Type 2 diabetes mellitus without complications: Secondary | ICD-10-CM

## 2024-09-17 NOTE — Progress Notes (Signed)
 Incisions are healing well.  3 views left radiographs taken today shows no fracture or acute complication.  Adequate spur resection.  No signs of infection on the incisions they are well coapted.  Advised to continue to ice and elevate.  Has already had refill of oxycodone  earlier this week.  Continue nonweightbearing.  Well-padded below-knee posterior splint was reapplied.  Return in 2 weeks to have sutures removed and transition to boot with plantarflexion wedges.

## 2024-09-20 ENCOUNTER — Telehealth: Payer: Self-pay | Admitting: Lab

## 2024-09-20 MED ORDER — CYCLOBENZAPRINE HCL 5 MG PO TABS: 5.0000 mg | ORAL_TABLET | Freq: Three times a day (TID) | ORAL | 0 refills | Status: AC | PRN

## 2024-09-20 NOTE — Telephone Encounter (Signed)
 Patient states having severe pain nothing is helping states it feels like contractions in her achilles area please advise.

## 2024-09-28 ENCOUNTER — Encounter

## 2024-09-28 ENCOUNTER — Ambulatory Visit: Admitting: Podiatry

## 2024-09-28 VITALS — Ht 67.0 in | Wt 201.0 lb

## 2024-09-28 DIAGNOSIS — M19072 Primary osteoarthritis, left ankle and foot: Secondary | ICD-10-CM

## 2024-09-28 DIAGNOSIS — M6788 Other specified disorders of synovium and tendon, other site: Secondary | ICD-10-CM

## 2024-09-28 DIAGNOSIS — M7732 Calcaneal spur, left foot: Secondary | ICD-10-CM

## 2024-09-28 NOTE — Patient Instructions (Signed)
Call to schedule physical therapy: [facility] at [place] [address]  [phone]  

## 2024-09-28 NOTE — Progress Notes (Signed)
"  °  Subjective:  Patient ID: Isabel Berger, female    DOB: 02/06/1966,  MRN: [id number]  Chief Complaint  Patient presents with   Post-op Follow-up    RM 20 POV #2 DOS 09/08/2024 LT CALACANEAL OSTEOTOMY, LT TARSAL EXOSTECTOMY, LT REPAIR SECONDARY ACHILLES TENDON W/ OR W/O GRAFT) 120 MINS. All sutures removed with some minor swelling.      59 y.o. female returns for post-op check.  Pain and spasms are starting to improve  Review of Systems: Negative except as noted in the HPI. Denies N/V/F/Ch.   Objective:  There were no vitals filed for this visit. Body mass index is 31.48 kg/m. Constitutional Well developed. Well nourished.  Vascular Foot warm and well perfused. Capillary refill normal to all digits.  Calf is soft and supple, no posterior calf or knee pain, negative Homans' sign  Neurologic Normal speech. Oriented to person, place, and time. Epicritic sensation to light touch grossly present bilaterally.  Dermatologic Skin healing well without signs of infection. Skin edges well coapted without signs of infection.  Orthopedic: Tenderness to palpation noted about the surgical site.   Multiple view plain film radiographs: Previous x-rays shows good correction Assessment:   1. Achilles tendinosis of left lower extremity   2. Arthritis of left midfoot   3. Heel spur, left    Plan:  Patient was evaluated and treated and all questions answered.  S/p foot surgery left -Progressing as expected post-operatively.  Sutures removed uneventfully.  Continue nonweightbearing for 1-1/2 more weeks and can begin gradual weightbearing in boot with plantarflexion wedges after February 16.  These were dispensed today.  May utilize cane and/or crutch for safe ambulation as well.  Begin physical therapy at this point as well referral placed and can start to work on range of motion and strengthening exercises gradual resistive exercise we will start removal of plantarflexion wedges after that  visit.  Follow-up as scheduled in 2 weeks   No follow-ups on file.  "

## 2024-10-10 ENCOUNTER — Encounter

## 2024-10-12 ENCOUNTER — Encounter: Admitting: Podiatry

## 2024-10-24 ENCOUNTER — Encounter: Admitting: Podiatry
